# Patient Record
Sex: Male | Born: 1940 | Race: White | Hispanic: No | Marital: Married | Smoking: Former smoker
Health system: Southern US, Community
[De-identification: ages and names within clinical notes are randomized; demographics above are authoritative.]

## PROBLEM LIST (undated history)

## (undated) DIAGNOSIS — F329 Major depressive disorder, single episode, unspecified: Secondary | ICD-10-CM

## (undated) DIAGNOSIS — C9000 Multiple myeloma not having achieved remission: Secondary | ICD-10-CM

## (undated) DIAGNOSIS — R7303 Prediabetes: Secondary | ICD-10-CM

## (undated) DIAGNOSIS — F32A Depression, unspecified: Secondary | ICD-10-CM

## (undated) DIAGNOSIS — Z800 Family history of malignant neoplasm of digestive organs: Secondary | ICD-10-CM

## (undated) DIAGNOSIS — C61 Malignant neoplasm of prostate: Secondary | ICD-10-CM

## (undated) DIAGNOSIS — M1990 Unspecified osteoarthritis, unspecified site: Secondary | ICD-10-CM

## (undated) DIAGNOSIS — W19XXXA Unspecified fall, initial encounter: Secondary | ICD-10-CM

## (undated) DIAGNOSIS — G4700 Insomnia, unspecified: Secondary | ICD-10-CM

## (undated) DIAGNOSIS — F1021 Alcohol dependence, in remission: Secondary | ICD-10-CM

## (undated) DIAGNOSIS — I10 Essential (primary) hypertension: Secondary | ICD-10-CM

## (undated) DIAGNOSIS — M899 Disorder of bone, unspecified: Secondary | ICD-10-CM

## (undated) DIAGNOSIS — E785 Hyperlipidemia, unspecified: Secondary | ICD-10-CM

## (undated) HISTORY — PX: LAPAROSCOPIC RETROPUBIC PROSTATECTOMY: SUR794

## (undated) HISTORY — PX: EXCISIONAL TOTAL KNEE ARTHROPLASTY: SHX5015

## (undated) HISTORY — DX: Insomnia, unspecified: G47.00

## (undated) HISTORY — DX: Major depressive disorder, single episode, unspecified: F32.9

## (undated) HISTORY — DX: Essential (primary) hypertension: I10

## (undated) HISTORY — PX: OTHER SURGICAL HISTORY: SHX169

## (undated) HISTORY — DX: Prediabetes: R73.03

## (undated) HISTORY — DX: Depression, unspecified: F32.A

## (undated) HISTORY — DX: Family history of malignant neoplasm of digestive organs: Z80.0

## (undated) HISTORY — DX: Hyperlipidemia, unspecified: E78.5

## (undated) HISTORY — DX: Alcohol dependence, in remission: F10.21

## (undated) HISTORY — DX: Unspecified osteoarthritis, unspecified site: M19.90

---

## 2000-05-30 HISTORY — PX: ANTERIOR FUSION CERVICAL SPINE: SUR626

## 2013-05-30 HISTORY — PX: COLONOSCOPY: SHX174

## 2013-05-30 LAB — HM COLONOSCOPY

## 2014-07-04 DIAGNOSIS — C9002 Multiple myeloma in relapse: Secondary | ICD-10-CM | POA: Insufficient documentation

## 2014-11-24 DIAGNOSIS — M25562 Pain in left knee: Secondary | ICD-10-CM | POA: Insufficient documentation

## 2015-06-02 DIAGNOSIS — L600 Ingrowing nail: Secondary | ICD-10-CM | POA: Diagnosis not present

## 2015-06-02 DIAGNOSIS — M79674 Pain in right toe(s): Secondary | ICD-10-CM | POA: Diagnosis not present

## 2015-06-05 DIAGNOSIS — C9002 Multiple myeloma in relapse: Secondary | ICD-10-CM | POA: Diagnosis not present

## 2015-06-05 DIAGNOSIS — C9000 Multiple myeloma not having achieved remission: Secondary | ICD-10-CM | POA: Diagnosis not present

## 2015-06-12 DIAGNOSIS — C9002 Multiple myeloma in relapse: Secondary | ICD-10-CM | POA: Diagnosis not present

## 2015-06-12 DIAGNOSIS — L298 Other pruritus: Secondary | ICD-10-CM | POA: Diagnosis not present

## 2015-06-12 DIAGNOSIS — C9000 Multiple myeloma not having achieved remission: Secondary | ICD-10-CM | POA: Diagnosis not present

## 2015-06-12 DIAGNOSIS — G8929 Other chronic pain: Secondary | ICD-10-CM | POA: Diagnosis not present

## 2015-06-12 DIAGNOSIS — S76312D Strain of muscle, fascia and tendon of the posterior muscle group at thigh level, left thigh, subsequent encounter: Secondary | ICD-10-CM | POA: Diagnosis not present

## 2015-06-19 DIAGNOSIS — C9000 Multiple myeloma not having achieved remission: Secondary | ICD-10-CM | POA: Diagnosis not present

## 2015-06-26 DIAGNOSIS — C9000 Multiple myeloma not having achieved remission: Secondary | ICD-10-CM | POA: Diagnosis not present

## 2015-07-03 DIAGNOSIS — C9002 Multiple myeloma in relapse: Secondary | ICD-10-CM | POA: Diagnosis not present

## 2015-07-03 DIAGNOSIS — C9000 Multiple myeloma not having achieved remission: Secondary | ICD-10-CM | POA: Diagnosis not present

## 2015-07-13 DIAGNOSIS — T451X5A Adverse effect of antineoplastic and immunosuppressive drugs, initial encounter: Secondary | ICD-10-CM | POA: Insufficient documentation

## 2015-07-13 DIAGNOSIS — C9002 Multiple myeloma in relapse: Secondary | ICD-10-CM | POA: Diagnosis not present

## 2015-07-13 DIAGNOSIS — D6481 Anemia due to antineoplastic chemotherapy: Secondary | ICD-10-CM | POA: Diagnosis not present

## 2015-07-13 DIAGNOSIS — C9000 Multiple myeloma not having achieved remission: Secondary | ICD-10-CM | POA: Diagnosis not present

## 2015-07-20 DIAGNOSIS — C9000 Multiple myeloma not having achieved remission: Secondary | ICD-10-CM | POA: Diagnosis not present

## 2015-07-27 DIAGNOSIS — C9000 Multiple myeloma not having achieved remission: Secondary | ICD-10-CM | POA: Diagnosis not present

## 2015-08-03 DIAGNOSIS — C9000 Multiple myeloma not having achieved remission: Secondary | ICD-10-CM | POA: Diagnosis not present

## 2015-08-03 DIAGNOSIS — C9002 Multiple myeloma in relapse: Secondary | ICD-10-CM | POA: Diagnosis not present

## 2015-08-12 DIAGNOSIS — C9000 Multiple myeloma not having achieved remission: Secondary | ICD-10-CM | POA: Diagnosis not present

## 2015-08-12 DIAGNOSIS — M899 Disorder of bone, unspecified: Secondary | ICD-10-CM | POA: Insufficient documentation

## 2015-08-17 DIAGNOSIS — G8929 Other chronic pain: Secondary | ICD-10-CM | POA: Diagnosis not present

## 2015-08-17 DIAGNOSIS — L299 Pruritus, unspecified: Secondary | ICD-10-CM | POA: Diagnosis not present

## 2015-08-17 DIAGNOSIS — C9000 Multiple myeloma not having achieved remission: Secondary | ICD-10-CM | POA: Diagnosis not present

## 2015-08-17 DIAGNOSIS — C9002 Multiple myeloma in relapse: Secondary | ICD-10-CM | POA: Diagnosis not present

## 2015-08-17 DIAGNOSIS — Z79899 Other long term (current) drug therapy: Secondary | ICD-10-CM | POA: Diagnosis not present

## 2015-08-24 DIAGNOSIS — C9000 Multiple myeloma not having achieved remission: Secondary | ICD-10-CM | POA: Diagnosis not present

## 2015-08-26 DIAGNOSIS — H35372 Puckering of macula, left eye: Secondary | ICD-10-CM | POA: Diagnosis not present

## 2015-08-26 DIAGNOSIS — H01003 Unspecified blepharitis right eye, unspecified eyelid: Secondary | ICD-10-CM | POA: Diagnosis not present

## 2015-08-26 DIAGNOSIS — H01006 Unspecified blepharitis left eye, unspecified eyelid: Secondary | ICD-10-CM | POA: Diagnosis not present

## 2015-08-31 DIAGNOSIS — C9000 Multiple myeloma not having achieved remission: Secondary | ICD-10-CM | POA: Diagnosis not present

## 2015-09-10 DIAGNOSIS — C9000 Multiple myeloma not having achieved remission: Secondary | ICD-10-CM | POA: Diagnosis not present

## 2015-09-18 DIAGNOSIS — Z5112 Encounter for antineoplastic immunotherapy: Secondary | ICD-10-CM | POA: Diagnosis not present

## 2015-09-18 DIAGNOSIS — M899 Disorder of bone, unspecified: Secondary | ICD-10-CM | POA: Diagnosis not present

## 2015-09-18 DIAGNOSIS — C9000 Multiple myeloma not having achieved remission: Secondary | ICD-10-CM | POA: Diagnosis not present

## 2015-09-18 DIAGNOSIS — Z5111 Encounter for antineoplastic chemotherapy: Secondary | ICD-10-CM | POA: Diagnosis not present

## 2015-09-24 DIAGNOSIS — Z87891 Personal history of nicotine dependence: Secondary | ICD-10-CM | POA: Diagnosis not present

## 2015-09-24 DIAGNOSIS — C9000 Multiple myeloma not having achieved remission: Secondary | ICD-10-CM | POA: Diagnosis not present

## 2015-09-24 DIAGNOSIS — Z5111 Encounter for antineoplastic chemotherapy: Secondary | ICD-10-CM | POA: Diagnosis not present

## 2015-09-28 DIAGNOSIS — M4806 Spinal stenosis, lumbar region: Secondary | ICD-10-CM | POA: Diagnosis not present

## 2015-09-28 DIAGNOSIS — M5136 Other intervertebral disc degeneration, lumbar region: Secondary | ICD-10-CM | POA: Diagnosis not present

## 2015-09-28 DIAGNOSIS — C9000 Multiple myeloma not having achieved remission: Secondary | ICD-10-CM | POA: Diagnosis not present

## 2015-09-30 DIAGNOSIS — Z87891 Personal history of nicotine dependence: Secondary | ICD-10-CM | POA: Diagnosis not present

## 2015-09-30 DIAGNOSIS — Z85828 Personal history of other malignant neoplasm of skin: Secondary | ICD-10-CM | POA: Diagnosis not present

## 2015-09-30 DIAGNOSIS — L219 Seborrheic dermatitis, unspecified: Secondary | ICD-10-CM | POA: Diagnosis not present

## 2015-09-30 DIAGNOSIS — Z5111 Encounter for antineoplastic chemotherapy: Secondary | ICD-10-CM | POA: Diagnosis not present

## 2015-09-30 DIAGNOSIS — C9000 Multiple myeloma not having achieved remission: Secondary | ICD-10-CM | POA: Diagnosis not present

## 2015-09-30 DIAGNOSIS — L570 Actinic keratosis: Secondary | ICD-10-CM | POA: Diagnosis not present

## 2015-10-14 DIAGNOSIS — C9000 Multiple myeloma not having achieved remission: Secondary | ICD-10-CM | POA: Diagnosis not present

## 2015-10-14 DIAGNOSIS — Z5112 Encounter for antineoplastic immunotherapy: Secondary | ICD-10-CM | POA: Diagnosis not present

## 2015-10-14 DIAGNOSIS — Z79899 Other long term (current) drug therapy: Secondary | ICD-10-CM | POA: Diagnosis not present

## 2015-10-19 DIAGNOSIS — C9000 Multiple myeloma not having achieved remission: Secondary | ICD-10-CM | POA: Diagnosis not present

## 2015-10-20 DIAGNOSIS — Z961 Presence of intraocular lens: Secondary | ICD-10-CM | POA: Diagnosis not present

## 2015-10-20 DIAGNOSIS — H01029 Squamous blepharitis unspecified eye, unspecified eyelid: Secondary | ICD-10-CM | POA: Diagnosis not present

## 2015-10-21 DIAGNOSIS — C9000 Multiple myeloma not having achieved remission: Secondary | ICD-10-CM | POA: Diagnosis not present

## 2015-10-28 DIAGNOSIS — C9000 Multiple myeloma not having achieved remission: Secondary | ICD-10-CM | POA: Diagnosis not present

## 2015-11-05 DIAGNOSIS — C9000 Multiple myeloma not having achieved remission: Secondary | ICD-10-CM | POA: Diagnosis not present

## 2015-11-05 DIAGNOSIS — Z8546 Personal history of malignant neoplasm of prostate: Secondary | ICD-10-CM | POA: Diagnosis not present

## 2015-11-11 DIAGNOSIS — C9000 Multiple myeloma not having achieved remission: Secondary | ICD-10-CM | POA: Diagnosis not present

## 2015-11-11 DIAGNOSIS — Z8546 Personal history of malignant neoplasm of prostate: Secondary | ICD-10-CM | POA: Diagnosis not present

## 2015-11-25 DIAGNOSIS — C9000 Multiple myeloma not having achieved remission: Secondary | ICD-10-CM | POA: Diagnosis not present

## 2015-12-03 DIAGNOSIS — H16223 Keratoconjunctivitis sicca, not specified as Sjogren's, bilateral: Secondary | ICD-10-CM | POA: Diagnosis not present

## 2015-12-03 DIAGNOSIS — Z961 Presence of intraocular lens: Secondary | ICD-10-CM | POA: Diagnosis not present

## 2015-12-03 DIAGNOSIS — H01029 Squamous blepharitis unspecified eye, unspecified eyelid: Secondary | ICD-10-CM | POA: Diagnosis not present

## 2015-12-03 DIAGNOSIS — H35372 Puckering of macula, left eye: Secondary | ICD-10-CM | POA: Diagnosis not present

## 2015-12-09 DIAGNOSIS — C9000 Multiple myeloma not having achieved remission: Secondary | ICD-10-CM | POA: Diagnosis not present

## 2015-12-16 DIAGNOSIS — J069 Acute upper respiratory infection, unspecified: Secondary | ICD-10-CM | POA: Diagnosis not present

## 2015-12-23 DIAGNOSIS — C9000 Multiple myeloma not having achieved remission: Secondary | ICD-10-CM | POA: Diagnosis not present

## 2016-01-06 DIAGNOSIS — C9000 Multiple myeloma not having achieved remission: Secondary | ICD-10-CM | POA: Diagnosis not present

## 2016-01-14 DIAGNOSIS — H01029 Squamous blepharitis unspecified eye, unspecified eyelid: Secondary | ICD-10-CM | POA: Diagnosis not present

## 2016-01-14 DIAGNOSIS — Z961 Presence of intraocular lens: Secondary | ICD-10-CM | POA: Diagnosis not present

## 2016-01-14 DIAGNOSIS — H35372 Puckering of macula, left eye: Secondary | ICD-10-CM | POA: Diagnosis not present

## 2016-01-14 DIAGNOSIS — H16223 Keratoconjunctivitis sicca, not specified as Sjogren's, bilateral: Secondary | ICD-10-CM | POA: Diagnosis not present

## 2016-01-20 DIAGNOSIS — R972 Elevated prostate specific antigen [PSA]: Secondary | ICD-10-CM | POA: Diagnosis not present

## 2016-01-20 DIAGNOSIS — Z8546 Personal history of malignant neoplasm of prostate: Secondary | ICD-10-CM | POA: Insufficient documentation

## 2016-01-20 DIAGNOSIS — Z87891 Personal history of nicotine dependence: Secondary | ICD-10-CM | POA: Diagnosis not present

## 2016-01-20 DIAGNOSIS — C9000 Multiple myeloma not having achieved remission: Secondary | ICD-10-CM | POA: Diagnosis not present

## 2016-01-20 DIAGNOSIS — Z9079 Acquired absence of other genital organ(s): Secondary | ICD-10-CM | POA: Diagnosis not present

## 2016-01-20 DIAGNOSIS — C61 Malignant neoplasm of prostate: Secondary | ICD-10-CM | POA: Diagnosis not present

## 2016-01-20 DIAGNOSIS — Z5112 Encounter for antineoplastic immunotherapy: Secondary | ICD-10-CM | POA: Diagnosis not present

## 2016-01-20 DIAGNOSIS — Z79899 Other long term (current) drug therapy: Secondary | ICD-10-CM | POA: Diagnosis not present

## 2016-02-03 DIAGNOSIS — M50222 Other cervical disc displacement at C5-C6 level: Secondary | ICD-10-CM | POA: Diagnosis not present

## 2016-02-03 DIAGNOSIS — M50223 Other cervical disc displacement at C6-C7 level: Secondary | ICD-10-CM | POA: Diagnosis not present

## 2016-02-03 DIAGNOSIS — M50221 Other cervical disc displacement at C4-C5 level: Secondary | ICD-10-CM | POA: Diagnosis not present

## 2016-02-03 DIAGNOSIS — M4806 Spinal stenosis, lumbar region: Secondary | ICD-10-CM | POA: Diagnosis not present

## 2016-02-03 DIAGNOSIS — Z981 Arthrodesis status: Secondary | ICD-10-CM | POA: Diagnosis not present

## 2016-02-03 DIAGNOSIS — C9000 Multiple myeloma not having achieved remission: Secondary | ICD-10-CM | POA: Diagnosis not present

## 2016-02-11 DIAGNOSIS — Z9079 Acquired absence of other genital organ(s): Secondary | ICD-10-CM | POA: Diagnosis not present

## 2016-02-11 DIAGNOSIS — C61 Malignant neoplasm of prostate: Secondary | ICD-10-CM | POA: Diagnosis not present

## 2016-02-11 DIAGNOSIS — R972 Elevated prostate specific antigen [PSA]: Secondary | ICD-10-CM | POA: Diagnosis not present

## 2016-02-11 DIAGNOSIS — Z8546 Personal history of malignant neoplasm of prostate: Secondary | ICD-10-CM | POA: Diagnosis not present

## 2016-02-11 DIAGNOSIS — C9001 Multiple myeloma in remission: Secondary | ICD-10-CM | POA: Diagnosis not present

## 2016-02-11 DIAGNOSIS — C9000 Multiple myeloma not having achieved remission: Secondary | ICD-10-CM | POA: Diagnosis not present

## 2016-02-18 DIAGNOSIS — C9000 Multiple myeloma not having achieved remission: Secondary | ICD-10-CM | POA: Diagnosis not present

## 2016-02-19 DIAGNOSIS — R972 Elevated prostate specific antigen [PSA]: Secondary | ICD-10-CM | POA: Diagnosis not present

## 2016-02-26 DIAGNOSIS — C9000 Multiple myeloma not having achieved remission: Secondary | ICD-10-CM | POA: Diagnosis not present

## 2016-02-26 DIAGNOSIS — Z9079 Acquired absence of other genital organ(s): Secondary | ICD-10-CM | POA: Diagnosis not present

## 2016-02-26 DIAGNOSIS — Z8546 Personal history of malignant neoplasm of prostate: Secondary | ICD-10-CM | POA: Diagnosis not present

## 2016-02-26 DIAGNOSIS — C61 Malignant neoplasm of prostate: Secondary | ICD-10-CM | POA: Diagnosis not present

## 2016-03-03 DIAGNOSIS — C9000 Multiple myeloma not having achieved remission: Secondary | ICD-10-CM | POA: Diagnosis not present

## 2016-03-09 DIAGNOSIS — C61 Malignant neoplasm of prostate: Secondary | ICD-10-CM | POA: Diagnosis not present

## 2016-03-17 DIAGNOSIS — C9000 Multiple myeloma not having achieved remission: Secondary | ICD-10-CM | POA: Diagnosis not present

## 2016-03-28 ENCOUNTER — Encounter: Payer: Self-pay | Admitting: Radiation Oncology

## 2016-03-28 ENCOUNTER — Ambulatory Visit
Admission: RE | Admit: 2016-03-28 | Discharge: 2016-03-28 | Disposition: A | Discharge status: Home / Self Care | Payer: Medicare Other | Source: Ambulatory Visit | Attending: Radiation Oncology | Admitting: Radiation Oncology

## 2016-03-28 VITALS — BP 184/77 | HR 65 | Resp 18 | Wt 253.0 lb

## 2016-03-28 DIAGNOSIS — Z510 Encounter for antineoplastic radiation therapy: Secondary | ICD-10-CM | POA: Diagnosis not present

## 2016-03-28 DIAGNOSIS — Z981 Arthrodesis status: Secondary | ICD-10-CM | POA: Insufficient documentation

## 2016-03-28 DIAGNOSIS — M1990 Unspecified osteoarthritis, unspecified site: Secondary | ICD-10-CM | POA: Diagnosis not present

## 2016-03-28 DIAGNOSIS — R972 Elevated prostate specific antigen [PSA]: Secondary | ICD-10-CM | POA: Diagnosis not present

## 2016-03-28 DIAGNOSIS — C9000 Multiple myeloma not having achieved remission: Secondary | ICD-10-CM | POA: Insufficient documentation

## 2016-03-28 DIAGNOSIS — Z923 Personal history of irradiation: Secondary | ICD-10-CM | POA: Diagnosis not present

## 2016-03-28 DIAGNOSIS — Z800 Family history of malignant neoplasm of digestive organs: Secondary | ICD-10-CM | POA: Insufficient documentation

## 2016-03-28 DIAGNOSIS — Z9079 Acquired absence of other genital organ(s): Secondary | ICD-10-CM | POA: Insufficient documentation

## 2016-03-28 DIAGNOSIS — C61 Malignant neoplasm of prostate: Secondary | ICD-10-CM

## 2016-03-28 DIAGNOSIS — Z87891 Personal history of nicotine dependence: Secondary | ICD-10-CM | POA: Insufficient documentation

## 2016-03-28 DIAGNOSIS — R300 Dysuria: Secondary | ICD-10-CM | POA: Diagnosis not present

## 2016-03-28 DIAGNOSIS — Z79899 Other long term (current) drug therapy: Secondary | ICD-10-CM | POA: Diagnosis not present

## 2016-03-28 DIAGNOSIS — Z9484 Stem cells transplant status: Secondary | ICD-10-CM | POA: Diagnosis not present

## 2016-03-28 HISTORY — DX: Malignant neoplasm of prostate: C61

## 2016-03-28 HISTORY — DX: Disorder of bone, unspecified: M89.9

## 2016-03-28 HISTORY — DX: Multiple myeloma not having achieved remission: C90.00

## 2016-03-28 NOTE — Progress Notes (Signed)
See progress note under physician encounter. 

## 2016-03-28 NOTE — Progress Notes (Signed)
[place]         [phone] ________________________________  Initial Outpatient Consultation  Name: Derek Castro MRN: [id number]  Date: 03/28/2016  DOB: May 16, 1941  REFERRING PHYSICIAN: Ramon Dredge, MD  DIAGNOSIS: There were no encounter diagnoses.  No diagnosis found.  Recurrent biochemical prostatic adenocarcinoma. At time of diagnosis in 2013, Gleason Score was 4+3 and PSA 3.6  HISTORY OF PRESENT ILLNESS:Derek Castro is a 75 y.o. male with a history of prostate cancer. In 2013, the patient underwent prostatectomy in [place] for a Gleason 4+3 adenocarcinoma. Prior to surgery his PSA was 3.6. His final pathology did not reveal evidence of extracapsular extension, spread to lymph nodes or seminal vesicle however the margin was focally involved. His PSA was undetectable for a while however his PSA began to rise in the last year, this summer it was 0.15 in June 2017. On 02/11/16, the PSA was 0.23. On 02/19/16, the PSA was 0.25. As he is treated for his multiple myeloma at [facility] since he relocated to [facility], he did meet with radiation oncology and spent time speaking with Dr. Truman Hayward about the options for treating the prostatic fossa. He has been counseled on the role for IMRT to the fossa with short-term androgen deprivation to total of 6 months.  He received Lupron 45 mg on 02/26/2016. He comes to discuss Administration of radiotherapy with Dr. Tammi Klippel.   PREVIOUS RADIATION THERAPY: Yes  Patient reports he received palliative treatment about 5-6 years ago to the lumbar spine  in the back, and at least 10-12 years ago he received treatment to the cervical spine.   REVIEW OF SYSTEMS: On review of systems, the patient reports that he is doing well overall. He denies any chest pain, shortness of breath, cough, fevers, chills, night sweats, unintended weight changes. He denies any bowel or bladder disturbances, and denies abdominal pain,  nausea or vomiting. He denies any new musculoskeletal or joint aches or pains. A complete review of systems is obtained and is otherwise negative.   Past Medical History:  Past Medical History:  Diagnosis Date  . Arthritis   . Bone lesion   . Multiple myeloma ([place])   . Prostate cancer [facility])     Past Surgical History: Past Surgical History:  Procedure Laterality Date  . ANTERIOR FUSION CERVICAL SPINE  2002  . autologous stem cell transplant    . EXCISIONAL TOTAL KNEE ARTHROPLASTY    . LAPAROSCOPIC RETROPUBIC PROSTATECTOMY      Social History:  Social History   Social History  . Marital status: Married    Spouse name: N/A  . Number of children: N/A  . Years of education: N/A   Occupational History  . Not on file.   Social History Main Topics  . Smoking status: Former Smoker    Packs/day: 1.00    Years: 5.00    Types: Cigarettes    Quit date: 02/11/1996  . Smokeless tobacco: Never Used  . Alcohol use No  . Drug use: No  . Sexual activity: Not Currently   Other Topics Concern  . Not on file   Social History Narrative  . No narrative on file    Family History: Family History  Problem Relation Age of Onset  . Cancer Mother     colon   Medications:  Current Meds  Medication Sig  . acetaminophen (TYLENOL) 325 MG tablet Take by mouth.  Marland Kitchen acyclovir (ZOVIRAX) 400 MG tablet Take by mouth.  Marland Kitchen  amLODipine (NORVASC) 2.5 MG tablet Take by mouth.  . bortezomib IV (VELCADE) 3.5 MG injection Inject into the skin.  . fluorometholone (FML) 0.1 % ophthalmic suspension INSTILL 1 DROP INTO BOTH EYES TWICE A DAY  . FLUoxetine (PROZAC) 20 MG capsule Take 20 mg by mouth daily.  . QUEtiapine (SEROQUEL) 100 MG tablet Take by mouth.  . zolendronic acid (ZOMETA) 4 MG/5ML injection Inject into the vein.    Allergies: No Known Allergies   PHYSICAL EXAM:  Blood pressure (!) 184/77, pulse 65, resp. rate 18, weight 253 lb (114.8 kg), SpO2 100 %.  In general this is a well  appearing caucasian male in no acute distress. He's alert and oriented x4 and appropriate throughout the examination. Cardiopulmonary assessment is negative for acute distress and he exhibits normal effort.   KPS = 100  100 - Normal; no complaints; no evidence of disease. 90   - Able to carry on normal activity; minor signs or symptoms of disease. 80   - Normal activity with effort; some signs or symptoms of disease. 41   - Cares for self; unable to carry on normal activity or to do active work. 60   - Requires occasional assistance, but is able to care for most of his personal needs. 50   - Requires considerable assistance and frequent medical care. 31   - Disabled; requires special care and assistance. 39   - Severely disabled; hospital admission is indicated although death not imminent. 37   - Very sick; hospital admission necessary; active supportive treatment necessary. 10   - Moribund; fatal processes progressing rapidly. 0     - Dead  Karnofsky DA, Abelmann WH, Craver LS and Burchenal JH [phone]) The use of the nitrogen mustards in the palliative treatment of carcinoma: with particular reference to bronchogenic carcinoma Cancer 1 634-56  LABORATORY DATA:  No results found for: WBC, HGB, HCT, MCV, PLT No results found for: NA, K, CL, CO2 No results found for: ALT, AST, GGT, ALKPHOS, BILITOT   RADIOGRAPHY: No results found.    IMPRESSION/PLAN: 1. 75 y.o. gentleman with recurrent biochemical prostatic adenocarcinoma. At time of diagnosis in 2013, Gleason Score was 4+3 and PSA 3.6, now with a rising PSA of .25. Dr. Tammi Klippel discusses the findings on the patient's pathology report dating back to his prostatectomy 2013 and reviews the rationale for or radiotherapy to the prosthetic fossa with his elevation in PSA. He reviews the concurrent use of androgen deprivation and agrees with this. At the patient has received his Lupron shot already, we could postpone treatment until the first Year or  move forward now. The patient is interested in moving ahead now. We discussed the risks, benefits, short and  long-term effects of radiotherapy, and the patient is interested in moving forward. Dr. Tammi Klippel recommends a course of 7-1/2 weeks of radiotherapy to total 38 fractions. We'll set him up for simulation tomorrow morning, and will move forward with his treatment in the next week. 2. Multiple myeloma. The patient will continue to follow up with his medical oncologist [facility], he will discontinue his Velcade during the course of radiation and resume this again following.  The above documentation reflects my direct findings during this shared patient visit. Please see the separate note by Dr. Tammi Klippel on this date for the remainder of the patient's plan of care.     Carola Rhine, PAC      This document serves as a record of services personally performed by Tyler Pita,  MD. It was created on his behalf by Bethann Humble, a trained medical scribe. The creation of this record is based on the scribe's personal observations and the provider's statements to them. This document has been checked and approved by the attending provider.

## 2016-03-28 NOTE — Progress Notes (Signed)
GU Location of Tumor / Histology: recurrent biochemical prostatic adenocarcinoma  If Prostate Cancer, Gleason Score is (4 + 3) and PSA is (3.6) at diagnosis in 2013  Adria Dill first postoperative PSA was undetectable. Most recent PSA history summarized below. 11/11/15 PSA  0.15 02/11/16 PSA  0.23 02/19/16 PSA  0.25     Past/Anticipated interventions by urology, if any: radical prostatectomy. Lupron 45 mg on 02/26/16.  Past/Anticipated interventions by medical oncology, if any: no  Weight changes, if any: no  Bowel/Bladder complaints, if any: Moderate ED. No bowel complaints. IPSS 18: incomplete emptying, frequency, intermittency, urgency, weak stream, straining and nocturia x 3. Denies dysuria or hematuria. Reports occasional leakage.   Nausea/Vomiting, if any: no  Pain issues, if any:  no  SAFETY ISSUES:  Prior radiation? Yes @ [facility] in [place]. Received radiation to his neck and back.  Pacemaker/ICD? no  Possible current pregnancy? no  Is the patient on methotrexate? no  Current Complaints / other details:  74 year old male. Married with two children.Retired.

## 2016-03-29 ENCOUNTER — Ambulatory Visit
Admission: RE | Admit: 2016-03-29 | Discharge: 2016-03-29 | Disposition: A | Discharge status: Home / Self Care | Payer: Medicare Other | Source: Ambulatory Visit | Attending: Radiation Oncology | Admitting: Radiation Oncology

## 2016-03-29 DIAGNOSIS — Z923 Personal history of irradiation: Secondary | ICD-10-CM | POA: Diagnosis not present

## 2016-03-29 DIAGNOSIS — M1990 Unspecified osteoarthritis, unspecified site: Secondary | ICD-10-CM | POA: Diagnosis not present

## 2016-03-29 DIAGNOSIS — C61 Malignant neoplasm of prostate: Secondary | ICD-10-CM

## 2016-03-29 DIAGNOSIS — C9000 Multiple myeloma not having achieved remission: Secondary | ICD-10-CM | POA: Diagnosis not present

## 2016-03-29 DIAGNOSIS — Z510 Encounter for antineoplastic radiation therapy: Secondary | ICD-10-CM | POA: Diagnosis not present

## 2016-03-29 DIAGNOSIS — R300 Dysuria: Secondary | ICD-10-CM | POA: Diagnosis not present

## 2016-03-29 NOTE — Addendum Note (Signed)
Encounter addended by: Heywood Footman, RN on: 03/29/2016 10:51 AM<BR>    Actions taken: Charge Capture section accepted

## 2016-03-29 NOTE — Progress Notes (Signed)
  Radiation Oncology         [phone]) [phone] ________________________________  Name: Derek Castro MRN: [id number]  Date: 03/29/2016  DOB: Sep 09, 1940  SIMULATION AND TREATMENT PLANNING NOTE    ICD-9-CM ICD-10-CM   1. Malignant neoplasm of prostate ([place]) 185 C61     DIAGNOSIS:  75 y.o. gentleman s/p prostatectomy for adenocarcinoma with a Gleason Score was 4+3 and a detectable rising PSA of 0.25  recurrent biochemical prostatic  NARRATIVE:  The patient was brought to the [facility].  Identity was confirmed.  All relevant records and images related to the planned course of therapy were reviewed.  The patient freely provided informed written consent to proceed with treatment after reviewing the details related to the planned course of therapy. The consent form was witnessed and verified by the simulation staff.  Then, the patient was set-up in a stable reproducible supine position for radiation therapy.  A vacuum lock pillow device was custom fabricated to position his legs in a reproducible immobilized position.  Then, I performed a urethrogram under sterile conditions to identify the prostatic apex.  CT images were obtained.  Surface markings were placed.  The CT images were loaded into the planning software.  Then the prostate target and avoidance structures including the rectum, bladder, bowel and hips were contoured.  Treatment planning then occurred.  The radiation prescription was entered and confirmed.  A total of 1 complex treatment devices were fabricated. I have requested : Intensity Modulated Radiotherapy (IMRT) is medically necessary for this case for the following reason:  Rectal sparing.Marland Kitchen  PLAN:  The patient will receive 68.4 Gy in 38 fractions.  ________________________________  Sheral Apley Tammi Klippel, M.D.

## 2016-03-31 DIAGNOSIS — M899 Disorder of bone, unspecified: Secondary | ICD-10-CM | POA: Diagnosis not present

## 2016-03-31 DIAGNOSIS — C9000 Multiple myeloma not having achieved remission: Secondary | ICD-10-CM | POA: Diagnosis not present

## 2016-03-31 DIAGNOSIS — L989 Disorder of the skin and subcutaneous tissue, unspecified: Secondary | ICD-10-CM | POA: Diagnosis not present

## 2016-04-04 DIAGNOSIS — M1990 Unspecified osteoarthritis, unspecified site: Secondary | ICD-10-CM | POA: Diagnosis not present

## 2016-04-04 DIAGNOSIS — R300 Dysuria: Secondary | ICD-10-CM | POA: Diagnosis not present

## 2016-04-04 DIAGNOSIS — C61 Malignant neoplasm of prostate: Secondary | ICD-10-CM | POA: Diagnosis not present

## 2016-04-04 DIAGNOSIS — C9000 Multiple myeloma not having achieved remission: Secondary | ICD-10-CM | POA: Diagnosis not present

## 2016-04-04 DIAGNOSIS — Z510 Encounter for antineoplastic radiation therapy: Secondary | ICD-10-CM | POA: Diagnosis not present

## 2016-04-04 DIAGNOSIS — Z923 Personal history of irradiation: Secondary | ICD-10-CM | POA: Diagnosis not present

## 2016-04-05 DIAGNOSIS — C61 Malignant neoplasm of prostate: Secondary | ICD-10-CM | POA: Diagnosis not present

## 2016-04-07 ENCOUNTER — Encounter: Payer: Self-pay | Admitting: Medical Oncology

## 2016-04-07 ENCOUNTER — Ambulatory Visit
Admission: RE | Admit: 2016-04-07 | Discharge: 2016-04-07 | Disposition: A | Discharge status: Home / Self Care | Payer: Medicare Other | Source: Ambulatory Visit | Attending: Radiation Oncology | Admitting: Radiation Oncology

## 2016-04-07 DIAGNOSIS — C61 Malignant neoplasm of prostate: Secondary | ICD-10-CM | POA: Diagnosis not present

## 2016-04-07 DIAGNOSIS — Z923 Personal history of irradiation: Secondary | ICD-10-CM | POA: Diagnosis not present

## 2016-04-07 DIAGNOSIS — R300 Dysuria: Secondary | ICD-10-CM | POA: Diagnosis not present

## 2016-04-07 DIAGNOSIS — C9000 Multiple myeloma not having achieved remission: Secondary | ICD-10-CM | POA: Diagnosis not present

## 2016-04-07 DIAGNOSIS — Z510 Encounter for antineoplastic radiation therapy: Secondary | ICD-10-CM | POA: Diagnosis not present

## 2016-04-07 DIAGNOSIS — M1990 Unspecified osteoarthritis, unspecified site: Secondary | ICD-10-CM | POA: Diagnosis not present

## 2016-04-08 ENCOUNTER — Ambulatory Visit
Admission: RE | Admit: 2016-04-08 | Discharge: 2016-04-08 | Disposition: A | Discharge status: Home / Self Care | Payer: Medicare Other | Source: Ambulatory Visit | Attending: Radiation Oncology | Admitting: Radiation Oncology

## 2016-04-08 VITALS — BP 139/58 | HR 72 | Resp 16 | Wt 249.6 lb

## 2016-04-08 DIAGNOSIS — R300 Dysuria: Secondary | ICD-10-CM | POA: Diagnosis not present

## 2016-04-08 DIAGNOSIS — C9000 Multiple myeloma not having achieved remission: Secondary | ICD-10-CM | POA: Diagnosis not present

## 2016-04-08 DIAGNOSIS — C61 Malignant neoplasm of prostate: Secondary | ICD-10-CM

## 2016-04-08 DIAGNOSIS — Z923 Personal history of irradiation: Secondary | ICD-10-CM | POA: Diagnosis not present

## 2016-04-08 DIAGNOSIS — M1990 Unspecified osteoarthritis, unspecified site: Secondary | ICD-10-CM | POA: Diagnosis not present

## 2016-04-08 DIAGNOSIS — Z510 Encounter for antineoplastic radiation therapy: Secondary | ICD-10-CM | POA: Diagnosis not present

## 2016-04-08 NOTE — Addendum Note (Signed)
Encounter addended by: Heywood Footman, RN on: 04/08/2016  3:05 PM<BR>    Actions taken: Chief Complaint modified, Patient Education assessment filed

## 2016-04-08 NOTE — Progress Notes (Signed)
  Radiation Oncology         [phone]   Name: Derek Castro MRN: [id number]   Date: 04/08/2016  DOB: 11/05/1940   Weekly Radiation Therapy Management    ICD-9-CM ICD-10-CM   1. Malignant neoplasm of prostate ([facility]) 185 C61     Current Dose: 3.6 Gy  Planned Dose:  68.4 Gy  Narrative The patient presents for routine under treatment assessment.  Weight and vitals stable. Denies pain. No bowel complaints. Reports continued incomplete emptying, frequency, intermittency, urgency, weak stream, straining, and nocturia x 3. Denies dysuria, hematuria, or fatigue. Reports occasional leakage.  Set-up films were reviewed. The chart was checked.  Physical Findings  weight is 249 lb 9.6 oz (113.2 kg). His blood pressure is 139/58 (abnormal) and his pulse is 72. His respiration is 16 and oxygen saturation is 100%.  Weight essentially stable.  No significant changes.  Impression The patient is tolerating radiation.  Plan Continue treatment as planned.         Sheral Apley Tammi Klippel, M.D.  This document serves as a record of services personally performed by Tyler Pita, MD. It was created on his behalf by Darcus Austin, a trained medical scribe. The creation of this record is based on the scribe's personal observations and the provider's statements to them. This document has been checked and approved by the attending provider.

## 2016-04-08 NOTE — Progress Notes (Signed)
Weight and vitals stable. Denies pain. No bowel complaints. Reports continued incomplete emptying, frequency, intermittency, urgency, weak stream, straining and nocturia x 3. Denies dysuria or hematuria. Reports occasional leakage. Denies fatigue.  BP (!) 139/58 (BP Location: Left Arm, Patient Position: Sitting, Cuff Size: Large)   Pulse 72   Resp 16   Wt 249 lb 9.6 oz (113.2 kg)   SpO2 100%  Wt Readings from Last 3 Encounters:  04/08/16 249 lb 9.6 oz (113.2 kg)  03/28/16 253 lb (114.8 kg)

## 2016-04-11 ENCOUNTER — Ambulatory Visit
Admission: RE | Admit: 2016-04-11 | Discharge: 2016-04-11 | Disposition: A | Discharge status: Home / Self Care | Payer: Medicare Other | Source: Ambulatory Visit | Attending: Radiation Oncology | Admitting: Radiation Oncology

## 2016-04-11 DIAGNOSIS — C61 Malignant neoplasm of prostate: Secondary | ICD-10-CM | POA: Diagnosis not present

## 2016-04-11 DIAGNOSIS — M1990 Unspecified osteoarthritis, unspecified site: Secondary | ICD-10-CM | POA: Diagnosis not present

## 2016-04-11 DIAGNOSIS — R300 Dysuria: Secondary | ICD-10-CM | POA: Diagnosis not present

## 2016-04-11 DIAGNOSIS — Z923 Personal history of irradiation: Secondary | ICD-10-CM | POA: Diagnosis not present

## 2016-04-11 DIAGNOSIS — Z510 Encounter for antineoplastic radiation therapy: Secondary | ICD-10-CM | POA: Diagnosis not present

## 2016-04-11 DIAGNOSIS — C9000 Multiple myeloma not having achieved remission: Secondary | ICD-10-CM | POA: Diagnosis not present

## 2016-04-12 ENCOUNTER — Ambulatory Visit
Admission: RE | Admit: 2016-04-12 | Discharge: 2016-04-12 | Disposition: A | Discharge status: Home / Self Care | Payer: Medicare Other | Source: Ambulatory Visit | Attending: Radiation Oncology | Admitting: Radiation Oncology

## 2016-04-12 DIAGNOSIS — C9000 Multiple myeloma not having achieved remission: Secondary | ICD-10-CM | POA: Diagnosis not present

## 2016-04-12 DIAGNOSIS — Z510 Encounter for antineoplastic radiation therapy: Secondary | ICD-10-CM | POA: Diagnosis not present

## 2016-04-12 DIAGNOSIS — C61 Malignant neoplasm of prostate: Secondary | ICD-10-CM | POA: Diagnosis not present

## 2016-04-12 DIAGNOSIS — Z923 Personal history of irradiation: Secondary | ICD-10-CM | POA: Diagnosis not present

## 2016-04-12 DIAGNOSIS — M1990 Unspecified osteoarthritis, unspecified site: Secondary | ICD-10-CM | POA: Diagnosis not present

## 2016-04-12 DIAGNOSIS — R300 Dysuria: Secondary | ICD-10-CM | POA: Diagnosis not present

## 2016-04-13 ENCOUNTER — Ambulatory Visit
Admission: RE | Admit: 2016-04-13 | Discharge: 2016-04-13 | Disposition: A | Discharge status: Home / Self Care | Payer: Medicare Other | Source: Ambulatory Visit | Attending: Radiation Oncology | Admitting: Radiation Oncology

## 2016-04-13 ENCOUNTER — Encounter: Payer: Self-pay | Admitting: Medical Oncology

## 2016-04-13 DIAGNOSIS — R300 Dysuria: Secondary | ICD-10-CM | POA: Diagnosis not present

## 2016-04-13 DIAGNOSIS — C61 Malignant neoplasm of prostate: Secondary | ICD-10-CM

## 2016-04-13 DIAGNOSIS — Z923 Personal history of irradiation: Secondary | ICD-10-CM | POA: Diagnosis not present

## 2016-04-13 DIAGNOSIS — Z510 Encounter for antineoplastic radiation therapy: Secondary | ICD-10-CM | POA: Diagnosis not present

## 2016-04-13 DIAGNOSIS — C9000 Multiple myeloma not having achieved remission: Secondary | ICD-10-CM | POA: Diagnosis not present

## 2016-04-13 DIAGNOSIS — M1990 Unspecified osteoarthritis, unspecified site: Secondary | ICD-10-CM | POA: Diagnosis not present

## 2016-04-13 MED ORDER — LEVOFLOXACIN 500 MG PO TABS: 500.0000 mg | ORAL_TABLET | Freq: Every day | ORAL | 0 refills | Status: DC

## 2016-04-13 NOTE — Progress Notes (Signed)
Patient presented to the clinic today following radiation treatment requesting to be evaluated by a provider. Patient reports that in addition to prostate cancer he has multiple myeloma and now suffers from chest congestion. Patient requesting a z pak. Patient explains his PCP in [place] would prescribe this when symptoms of this nature presented and they would quickly then, resolve. Patient reports he has yet to establish a PCP in [place]. Patient reports new onset of productive cough with yellow to green thick sputum. BP elevated. Patient afebrile.   BP (!) 164/84 (BP Location: Left Arm, Patient Position: Sitting, Cuff Size: Large)   Pulse 74   Temp 98.4 F (36.9 C) (Oral)   Resp 18   SpO2 99%  Wt Readings from Last 3 Encounters:  04/08/16 249 lb 9.6 oz (113.2 kg)  03/28/16 253 lb (114.8 kg)

## 2016-04-14 ENCOUNTER — Ambulatory Visit
Admission: RE | Admit: 2016-04-14 | Discharge: 2016-04-14 | Disposition: A | Discharge status: Home / Self Care | Payer: Medicare Other | Source: Ambulatory Visit | Attending: Radiation Oncology | Admitting: Radiation Oncology

## 2016-04-14 VITALS — BP 140/67 | HR 75 | Resp 18 | Wt 247.4 lb

## 2016-04-14 DIAGNOSIS — Z923 Personal history of irradiation: Secondary | ICD-10-CM | POA: Diagnosis not present

## 2016-04-14 DIAGNOSIS — M1990 Unspecified osteoarthritis, unspecified site: Secondary | ICD-10-CM | POA: Diagnosis not present

## 2016-04-14 DIAGNOSIS — C9000 Multiple myeloma not having achieved remission: Secondary | ICD-10-CM | POA: Diagnosis not present

## 2016-04-14 DIAGNOSIS — C61 Malignant neoplasm of prostate: Secondary | ICD-10-CM | POA: Diagnosis not present

## 2016-04-14 DIAGNOSIS — R300 Dysuria: Secondary | ICD-10-CM | POA: Diagnosis not present

## 2016-04-14 DIAGNOSIS — Z510 Encounter for antineoplastic radiation therapy: Secondary | ICD-10-CM | POA: Diagnosis not present

## 2016-04-14 NOTE — Progress Notes (Signed)
  Radiation Oncology         [phone]   Name: Derek Castro MRN: [id number]   Date: 04/14/2016  DOB: 1940-12-02   Weekly Radiation Therapy Management    ICD-9-CM ICD-10-CM   1. Malignant neoplasm of prostate ([facility]) 185 C61     Current Dose: 10.8 Gy  Planned Dose:  68.4 Gy  Narrative The patient presents for routine under treatment assessment.  Denies pain, dysuria, or hematuria. He was given Levaquin yesterday by Shona Simpson, PA to manage nasal congestion. Reports nocturia x 5, continued incomplete emptying, frequency, intermittency and urgency, and occasional leakage. He has fatigue today because he didn't sleep well last night and he contributes this to not being able to take seroquel while taking Levaquin. Reports a cyst under testicles that has returned and it is larger and very sore (reports it's on the inside of the scrotum and is the size of a pea).  Set-up films were reviewed. The chart was checked.  Physical Findings  weight is 247 lb 6.4 oz (112.2 kg). His blood pressure is 140/67 and his pulse is 75. His respiration is 18 and oxygen saturation is 100%.  Weight essentially stable.  No significant changes.  Impression The patient is tolerating radiation.  Plan Continue treatment as planned. The Levaquin could help with the cyst if it has an infectious component. We will follow up with this next week during his next undertreatment visit or sooner if needed.         Sheral Apley Tammi Klippel, M.D.  This document serves as a record of services personally performed by Tyler Pita, MD. It was created on his behalf by Darcus Austin, a trained medical scribe. The creation of this record is based on the scribe's personal observations and the provider's statements to them. This document has been checked and approved by the attending provider.

## 2016-04-14 NOTE — Progress Notes (Signed)
Weight and vitals stable. Denies pain. Given medication yesterday by Bryson Ha to manage nasal congestion. Reports nocturia x 5. Denies dysuria or hematuria. Reports continued incomplete emptying, frequency, intermittency and urgency. Reports occasional leakage. Reports fatigue today because he didn't sleep well last night. He contributes this to not being able to take seroquel while taking new new medication prescribed. Reports cyst under testicles has returned is larger and very sore.  BP 140/67 (BP Location: Left Arm, Patient Position: Sitting, Cuff Size: Large)   Pulse 75   Resp 18   Wt 247 lb 6.4 oz (112.2 kg)   SpO2 100%  Wt Readings from Last 3 Encounters:  04/14/16 247 lb 6.4 oz (112.2 kg)  04/08/16 249 lb 9.6 oz (113.2 kg)  03/28/16 253 lb (114.8 kg)

## 2016-04-14 NOTE — Progress Notes (Signed)
Radiation Oncology         [phone] ________________________________  Name: Derek Castro MRN: [id number]  Date: 04/13/2016  DOB: 12-20-1940  Post Treatment Note  CC: No primary care provider on file.  Ramon Dredge, MD  Diagnosis:   Recurrent biochemical prostatic adenocarcinoma. At time of diagnosis in 2013, Gleason Score was 4+3 and PSA 3.6  Narrative:  The patient presents today for scheduled appointment due to concerns for possible bronchitis versus pneumonia. The patient reports that in the last week, he has had an increase in a cough that has become productive with thick yellow to green sputum, he does not know of any febrile episodes but states he's had a few episodes of the hot, he states that he has not had any upper sinus congestion, and denies any postnasal drip. He has not tried over-the-counter regimens at this point in time due to concern about his blood pressure. He states he does have some pain when taking deep breaths in the right chest. No other complaints or verbalized.                              ALLERGIES:  has No Known Allergies.  Meds: Current Outpatient Prescriptions  Medication Sig Dispense Refill  . acetaminophen (TYLENOL) 325 MG tablet Take by mouth.    Marland Kitchen acyclovir (ZOVIRAX) 400 MG tablet Take by mouth.    Marland Kitchen amLODipine (NORVASC) 10 MG tablet Take 10 mg by mouth daily.  0  . bortezomib IV (VELCADE) 3.5 MG injection Inject into the skin.    . fluorometholone (FML) 0.1 % ophthalmic suspension INSTILL 1 DROP INTO BOTH EYES TWICE A DAY  12  . FLUoxetine (PROZAC) 20 MG capsule Take 20 mg by mouth daily.  0  . levofloxacin (LEVAQUIN) 500 MG tablet Take 1 tablet (500 mg total) by mouth daily. 7 tablet 0  . ondansetron (ZOFRAN) 8 MG tablet TAKE 1 TABLET (8 MG TOTAL) BY MOUTH EVERY 8 (EIGHT) HOURS AS NEEDED FOR NAUSEA FOR UP TO 30 DOSES.  3  . QUEtiapine (SEROQUEL) 100 MG tablet Take by mouth.    . zolendronic acid (ZOMETA) 4 MG/5ML injection Inject into  the vein.    Marland Kitchen zolpidem (AMBIEN) 10 MG tablet Take by mouth.     No current facility-administered medications for this encounter.     Physical Findings:  vitals were not taken for this visit. In general this is a well appearing Caucasian male in no acute distress. He is alert and oriented x4 and appropriate throughout the examination. HEENT reveals that the patient is normocephalic, atraumatic. EOMs are intact. PERRLA. Skin is intact without any evidence of gross lesions. Cardiovascular exam reveals a regular rate and rhythm, no clicks rubs or murmurs are auscultated. Chest is clear to auscultation in the left fields. In the right, at the apex and mid fields there are crackles and coarse breath sounds with some decreased breath sounds in the mid fields concerning for consolidation. Lymphatic assessment is performed and does not reveal any adenopathy in the  supraclavicular, or axillary chains though there is increased cervical adenopathy. Abdomen has active bowel sounds in all quadrants and is intact. The abdomen is soft, non tender, non distended. Lower extremities are negative for pretibial pitting edema, deep calf tenderness, cyanosis or clubbing.   Lab Findings: No results found for: WBC, HGB, HCT, MCV, PLT   Radiographic Findings: No results found.  Impression/Plan: 1. Recurrent biochemical  prostatic adenocarcinoma. At time of diagnosis in 2013, Gleason Score was 4+3 and PSA 3.6. The patient is doing well overall since beginning treatment and will continue this week. He will return for his under treatment visit on Thursday.  2. Probable community acquired pneumonia. The patient was given a prescription for levaquin 500 mg #10, one tab po daily for 10 days. We will follow this expectantly and he will keep [place] informed of any questions or concerns.      Carola Rhine, PAC

## 2016-04-15 ENCOUNTER — Ambulatory Visit
Admission: RE | Admit: 2016-04-15 | Discharge: 2016-04-15 | Disposition: A | Discharge status: Home / Self Care | Payer: Medicare Other | Source: Ambulatory Visit | Attending: Radiation Oncology | Admitting: Radiation Oncology

## 2016-04-15 DIAGNOSIS — Z923 Personal history of irradiation: Secondary | ICD-10-CM | POA: Diagnosis not present

## 2016-04-15 DIAGNOSIS — C9000 Multiple myeloma not having achieved remission: Secondary | ICD-10-CM | POA: Diagnosis not present

## 2016-04-15 DIAGNOSIS — C61 Malignant neoplasm of prostate: Secondary | ICD-10-CM | POA: Diagnosis not present

## 2016-04-15 DIAGNOSIS — Z510 Encounter for antineoplastic radiation therapy: Secondary | ICD-10-CM | POA: Diagnosis not present

## 2016-04-15 DIAGNOSIS — R300 Dysuria: Secondary | ICD-10-CM | POA: Diagnosis not present

## 2016-04-15 DIAGNOSIS — M1990 Unspecified osteoarthritis, unspecified site: Secondary | ICD-10-CM | POA: Diagnosis not present

## 2016-04-15 NOTE — Progress Notes (Signed)
Mr. Derek Castro states he has ? Bronchitis. He is requesting an antibiotic. I informed him that he will need to see Shona Simpson, PA to evaluate him. Appointment scheduled and patient will be seen after his treatment.

## 2016-04-17 ENCOUNTER — Ambulatory Visit
Admission: RE | Admit: 2016-04-17 | Discharge: 2016-04-17 | Disposition: A | Discharge status: Home / Self Care | Payer: Medicare Other | Source: Ambulatory Visit | Attending: Radiation Oncology | Admitting: Radiation Oncology

## 2016-04-17 DIAGNOSIS — R300 Dysuria: Secondary | ICD-10-CM | POA: Diagnosis not present

## 2016-04-17 DIAGNOSIS — M1990 Unspecified osteoarthritis, unspecified site: Secondary | ICD-10-CM | POA: Diagnosis not present

## 2016-04-17 DIAGNOSIS — C61 Malignant neoplasm of prostate: Secondary | ICD-10-CM | POA: Diagnosis not present

## 2016-04-17 DIAGNOSIS — Z510 Encounter for antineoplastic radiation therapy: Secondary | ICD-10-CM | POA: Diagnosis not present

## 2016-04-17 DIAGNOSIS — C9000 Multiple myeloma not having achieved remission: Secondary | ICD-10-CM | POA: Diagnosis not present

## 2016-04-17 DIAGNOSIS — Z923 Personal history of irradiation: Secondary | ICD-10-CM | POA: Diagnosis not present

## 2016-04-18 ENCOUNTER — Ambulatory Visit
Admission: RE | Admit: 2016-04-18 | Discharge: 2016-04-18 | Disposition: A | Discharge status: Home / Self Care | Payer: Medicare Other | Source: Ambulatory Visit | Attending: Radiation Oncology | Admitting: Radiation Oncology

## 2016-04-18 DIAGNOSIS — R300 Dysuria: Secondary | ICD-10-CM | POA: Diagnosis not present

## 2016-04-18 DIAGNOSIS — C61 Malignant neoplasm of prostate: Secondary | ICD-10-CM | POA: Diagnosis not present

## 2016-04-18 DIAGNOSIS — M1990 Unspecified osteoarthritis, unspecified site: Secondary | ICD-10-CM | POA: Diagnosis not present

## 2016-04-18 DIAGNOSIS — C9000 Multiple myeloma not having achieved remission: Secondary | ICD-10-CM | POA: Diagnosis not present

## 2016-04-18 DIAGNOSIS — Z923 Personal history of irradiation: Secondary | ICD-10-CM | POA: Diagnosis not present

## 2016-04-18 DIAGNOSIS — Z510 Encounter for antineoplastic radiation therapy: Secondary | ICD-10-CM | POA: Diagnosis not present

## 2016-04-19 ENCOUNTER — Ambulatory Visit
Admission: RE | Admit: 2016-04-19 | Discharge: 2016-04-19 | Disposition: A | Discharge status: Home / Self Care | Payer: Medicare Other | Source: Ambulatory Visit | Attending: Radiation Oncology | Admitting: Radiation Oncology

## 2016-04-19 ENCOUNTER — Telehealth: Payer: Self-pay | Admitting: *Deleted

## 2016-04-19 DIAGNOSIS — C61 Malignant neoplasm of prostate: Secondary | ICD-10-CM | POA: Diagnosis not present

## 2016-04-19 DIAGNOSIS — Z923 Personal history of irradiation: Secondary | ICD-10-CM | POA: Diagnosis not present

## 2016-04-19 DIAGNOSIS — C9000 Multiple myeloma not having achieved remission: Secondary | ICD-10-CM | POA: Diagnosis not present

## 2016-04-19 DIAGNOSIS — Z510 Encounter for antineoplastic radiation therapy: Secondary | ICD-10-CM | POA: Diagnosis not present

## 2016-04-19 DIAGNOSIS — M1990 Unspecified osteoarthritis, unspecified site: Secondary | ICD-10-CM | POA: Diagnosis not present

## 2016-04-19 DIAGNOSIS — R300 Dysuria: Secondary | ICD-10-CM | POA: Diagnosis not present

## 2016-04-19 NOTE — Telephone Encounter (Signed)
Called patient to inform of appt. With Derek Castro on 04-25-16 - arrival time - 12:45 p.m., spoke with patient and he is aware of this appt.

## 2016-04-20 ENCOUNTER — Ambulatory Visit
Admission: RE | Admit: 2016-04-20 | Discharge: 2016-04-20 | Disposition: A | Discharge status: Home / Self Care | Payer: Medicare Other | Source: Ambulatory Visit | Attending: Radiation Oncology | Admitting: Radiation Oncology

## 2016-04-20 VITALS — BP 143/61 | HR 66 | Resp 16 | Wt 247.0 lb

## 2016-04-20 DIAGNOSIS — C61 Malignant neoplasm of prostate: Secondary | ICD-10-CM | POA: Diagnosis not present

## 2016-04-20 DIAGNOSIS — C9000 Multiple myeloma not having achieved remission: Secondary | ICD-10-CM | POA: Diagnosis not present

## 2016-04-20 DIAGNOSIS — Z923 Personal history of irradiation: Secondary | ICD-10-CM | POA: Diagnosis not present

## 2016-04-20 DIAGNOSIS — Z510 Encounter for antineoplastic radiation therapy: Secondary | ICD-10-CM | POA: Diagnosis not present

## 2016-04-20 DIAGNOSIS — M1990 Unspecified osteoarthritis, unspecified site: Secondary | ICD-10-CM | POA: Diagnosis not present

## 2016-04-20 DIAGNOSIS — R300 Dysuria: Secondary | ICD-10-CM | POA: Diagnosis not present

## 2016-04-20 NOTE — Progress Notes (Signed)
  Radiation Oncology         [phone]   Name: Derek Castro MRN: [id number]   Date: 04/20/2016  DOB: [birth year]   Weekly Radiation Therapy Management    ICD-9-CM ICD-10-CM   1. Malignant neoplasm of prostate ([facility]) 185 C61     Current Dose: 19.8 Gy  Planned Dose:  68.4 Gy  Narrative The patient presents for routine under treatment assessment.  Weight and vitals stable. Denies pain. Reports nocturia x 3 last night which is less than the typical 5. Reports he completed his antibiotics yesterday. Reports chest congestion has improved and cyst under his testicles seems smaller and less irritated. Reports occasional leakage continues. Reports continued incomplete emptying, frequency, intermittency and urgency. Reports fatigue.  Set-up films were reviewed. The chart was checked.  Physical Findings  weight is 247 lb (112 kg). His blood pressure is 143/61 (abnormal) and his pulse is 66. His respiration is 16 and oxygen saturation is 100%.  Weight essentially stable.  No significant changes.  Impression The patient is tolerating radiation.  Plan Continue treatment as planned.          Sheral Apley Tammi Klippel, M.D.  This document serves as a record of services personally performed by Tyler Pita, MD. It was created on his behalf by Arlyce Harman, a trained medical scribe. The creation of this record is based on the scribe's personal observations and the provider's statements to them. This document has been checked and approved by the attending provider.

## 2016-04-20 NOTE — Progress Notes (Signed)
Weight and vitals stable. Denies pain. Reports nocturia x 3 last night which is less than the typical 5. Reports he has completed his antibiotics. Reports chest congestion has improved and cyst under his testicles seems smaller and less irritated. Reports occasional leakage continues. Reports continued incomplete emptying, frequency, intermittency and urgency. Reports fatigue.   BP (!) 143/61   Pulse 66   Resp 16   Wt 247 lb (112 kg)   SpO2 100%  Wt Readings from Last 3 Encounters:  04/20/16 247 lb (112 kg)  04/14/16 247 lb 6.4 oz (112.2 kg)  04/08/16 249 lb 9.6 oz (113.2 kg)

## 2016-04-22 ENCOUNTER — Ambulatory Visit: Payer: Medicare Other

## 2016-04-25 ENCOUNTER — Ambulatory Visit
Admission: RE | Admit: 2016-04-25 | Discharge: 2016-04-25 | Disposition: A | Discharge status: Home / Self Care | Payer: Medicare Other | Source: Ambulatory Visit | Attending: Radiation Oncology | Admitting: Radiation Oncology

## 2016-04-25 DIAGNOSIS — C61 Malignant neoplasm of prostate: Secondary | ICD-10-CM | POA: Diagnosis not present

## 2016-04-25 DIAGNOSIS — M1990 Unspecified osteoarthritis, unspecified site: Secondary | ICD-10-CM | POA: Diagnosis not present

## 2016-04-25 DIAGNOSIS — I10 Essential (primary) hypertension: Secondary | ICD-10-CM | POA: Diagnosis not present

## 2016-04-25 DIAGNOSIS — Z510 Encounter for antineoplastic radiation therapy: Secondary | ICD-10-CM | POA: Diagnosis not present

## 2016-04-25 DIAGNOSIS — F5101 Primary insomnia: Secondary | ICD-10-CM | POA: Diagnosis not present

## 2016-04-25 DIAGNOSIS — F411 Generalized anxiety disorder: Secondary | ICD-10-CM | POA: Diagnosis not present

## 2016-04-25 DIAGNOSIS — C9000 Multiple myeloma not having achieved remission: Secondary | ICD-10-CM | POA: Diagnosis not present

## 2016-04-25 DIAGNOSIS — Z923 Personal history of irradiation: Secondary | ICD-10-CM | POA: Diagnosis not present

## 2016-04-25 DIAGNOSIS — R300 Dysuria: Secondary | ICD-10-CM | POA: Diagnosis not present

## 2016-04-26 ENCOUNTER — Ambulatory Visit
Admission: RE | Admit: 2016-04-26 | Discharge: 2016-04-26 | Disposition: A | Discharge status: Home / Self Care | Payer: Medicare Other | Source: Ambulatory Visit | Attending: Radiation Oncology | Admitting: Radiation Oncology

## 2016-04-26 DIAGNOSIS — Z510 Encounter for antineoplastic radiation therapy: Secondary | ICD-10-CM | POA: Diagnosis not present

## 2016-04-26 DIAGNOSIS — Z923 Personal history of irradiation: Secondary | ICD-10-CM | POA: Diagnosis not present

## 2016-04-26 DIAGNOSIS — C61 Malignant neoplasm of prostate: Secondary | ICD-10-CM | POA: Diagnosis not present

## 2016-04-26 DIAGNOSIS — C9000 Multiple myeloma not having achieved remission: Secondary | ICD-10-CM | POA: Diagnosis not present

## 2016-04-26 DIAGNOSIS — M1990 Unspecified osteoarthritis, unspecified site: Secondary | ICD-10-CM | POA: Diagnosis not present

## 2016-04-26 DIAGNOSIS — R300 Dysuria: Secondary | ICD-10-CM | POA: Diagnosis not present

## 2016-04-27 ENCOUNTER — Ambulatory Visit
Admission: RE | Admit: 2016-04-27 | Discharge: 2016-04-27 | Disposition: A | Discharge status: Home / Self Care | Payer: Medicare Other | Source: Ambulatory Visit | Attending: Radiation Oncology | Admitting: Radiation Oncology

## 2016-04-27 DIAGNOSIS — M1990 Unspecified osteoarthritis, unspecified site: Secondary | ICD-10-CM | POA: Diagnosis not present

## 2016-04-27 DIAGNOSIS — Z510 Encounter for antineoplastic radiation therapy: Secondary | ICD-10-CM | POA: Diagnosis not present

## 2016-04-27 DIAGNOSIS — C61 Malignant neoplasm of prostate: Secondary | ICD-10-CM | POA: Diagnosis not present

## 2016-04-27 DIAGNOSIS — R300 Dysuria: Secondary | ICD-10-CM | POA: Diagnosis not present

## 2016-04-27 DIAGNOSIS — Z923 Personal history of irradiation: Secondary | ICD-10-CM | POA: Diagnosis not present

## 2016-04-27 DIAGNOSIS — C9000 Multiple myeloma not having achieved remission: Secondary | ICD-10-CM | POA: Diagnosis not present

## 2016-04-28 ENCOUNTER — Ambulatory Visit
Admission: RE | Admit: 2016-04-28 | Discharge: 2016-04-28 | Disposition: A | Discharge status: Home / Self Care | Payer: Medicare Other | Source: Ambulatory Visit | Attending: Radiation Oncology | Admitting: Radiation Oncology

## 2016-04-28 DIAGNOSIS — C9000 Multiple myeloma not having achieved remission: Secondary | ICD-10-CM | POA: Diagnosis not present

## 2016-04-28 DIAGNOSIS — Z510 Encounter for antineoplastic radiation therapy: Secondary | ICD-10-CM | POA: Diagnosis not present

## 2016-04-28 DIAGNOSIS — C61 Malignant neoplasm of prostate: Secondary | ICD-10-CM | POA: Diagnosis not present

## 2016-04-28 DIAGNOSIS — M1990 Unspecified osteoarthritis, unspecified site: Secondary | ICD-10-CM | POA: Diagnosis not present

## 2016-04-28 DIAGNOSIS — Z923 Personal history of irradiation: Secondary | ICD-10-CM | POA: Diagnosis not present

## 2016-04-28 DIAGNOSIS — R300 Dysuria: Secondary | ICD-10-CM | POA: Diagnosis not present

## 2016-04-29 ENCOUNTER — Ambulatory Visit
Admission: RE | Admit: 2016-04-29 | Discharge: 2016-04-29 | Disposition: A | Discharge status: Home / Self Care | Payer: Medicare Other | Source: Ambulatory Visit | Attending: Radiation Oncology | Admitting: Radiation Oncology

## 2016-04-29 VITALS — BP 148/67 | HR 68 | Resp 18 | Wt 249.2 lb

## 2016-04-29 DIAGNOSIS — Z923 Personal history of irradiation: Secondary | ICD-10-CM | POA: Diagnosis not present

## 2016-04-29 DIAGNOSIS — C61 Malignant neoplasm of prostate: Secondary | ICD-10-CM

## 2016-04-29 DIAGNOSIS — Z510 Encounter for antineoplastic radiation therapy: Secondary | ICD-10-CM | POA: Diagnosis not present

## 2016-04-29 DIAGNOSIS — C9000 Multiple myeloma not having achieved remission: Secondary | ICD-10-CM | POA: Diagnosis not present

## 2016-04-29 DIAGNOSIS — R300 Dysuria: Secondary | ICD-10-CM | POA: Diagnosis not present

## 2016-04-29 DIAGNOSIS — M1990 Unspecified osteoarthritis, unspecified site: Secondary | ICD-10-CM | POA: Diagnosis not present

## 2016-04-29 NOTE — Progress Notes (Signed)
  Radiation Oncology         [phone]   Name: Derek Castro MRN: [id number]   Date: 04/29/2016  DOB: [birth year]   Weekly Radiation Therapy Management    ICD-9-CM ICD-10-CM   1. Malignant neoplasm of prostate ([facility]) 185 C61     Current Dose: 18 Gy  Planned Dose:  68.4 Gy  Narrative The patient presents for routine under treatment assessment.  Weight and vitals stable. Denies pain. Reports nocturia x 3-5. Reports chest congestion and cyst under his testicles have resolved. Reports occasional leakage continues. Reports continued incomplete emptying, frequency, intermittency and urgency. Reports fatigue. 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 The patient is on androgen depravation therapy.  Set-up films were reviewed. The chart was checked.  Physical Findings  weight is 249 lb 3.2 oz (113 kg). His blood pressure is 148/67 (abnormal) and his pulse is 68. His respiration is 18 and oxygen saturation is 100%.  Weight essentially stable.  No significant changes.  Impression The patient is tolerating radiation.  Plan Continue treatment as planned. I advised the patient to call his urologist at [facility] about his impotence.         Sheral Apley Tammi Klippel, M.D.  This document serves as a record of services personally performed by Tyler Pita, MD. It was created on his behalf by Darcus Austin, a trained medical scribe. The creation of this record is based on the scribe's personal observations and the provider's statements to them. This document has been checked and approved by the attending provider.

## 2016-04-29 NOTE — Progress Notes (Signed)
Weight and vitals stable. Denies pain. Reports nocturia x 3-5. Reports chest congestion and cyst under his testicles have resolved. Reports occasional leakage continues. Reports continued incomplete emptying, frequency, intermittency and urgency. Reports fatigue. Reports he is completely impotent and questions when this will improve.  BP (!) 148/67 (BP Location: Left Arm, Patient Position: Sitting, Cuff Size: Large)   Pulse 68   Resp 18   Wt 249 lb 3.2 oz (113 kg)   SpO2 100%  Wt Readings from Last 3 Encounters:  04/29/16 249 lb 3.2 oz (113 kg)  04/20/16 247 lb (112 kg)  04/14/16 247 lb 6.4 oz (112.2 kg)

## 2016-05-02 ENCOUNTER — Ambulatory Visit
Admission: RE | Admit: 2016-05-02 | Discharge: 2016-05-02 | Disposition: A | Discharge status: Home / Self Care | Payer: Medicare Other | Source: Ambulatory Visit | Attending: Radiation Oncology | Admitting: Radiation Oncology

## 2016-05-02 DIAGNOSIS — Z923 Personal history of irradiation: Secondary | ICD-10-CM | POA: Diagnosis not present

## 2016-05-02 DIAGNOSIS — M1990 Unspecified osteoarthritis, unspecified site: Secondary | ICD-10-CM | POA: Diagnosis not present

## 2016-05-02 DIAGNOSIS — C61 Malignant neoplasm of prostate: Secondary | ICD-10-CM | POA: Diagnosis not present

## 2016-05-02 DIAGNOSIS — C9000 Multiple myeloma not having achieved remission: Secondary | ICD-10-CM | POA: Diagnosis not present

## 2016-05-02 DIAGNOSIS — R300 Dysuria: Secondary | ICD-10-CM | POA: Diagnosis not present

## 2016-05-02 DIAGNOSIS — Z510 Encounter for antineoplastic radiation therapy: Secondary | ICD-10-CM | POA: Diagnosis not present

## 2016-05-03 ENCOUNTER — Ambulatory Visit
Admission: RE | Admit: 2016-05-03 | Discharge: 2016-05-03 | Disposition: A | Discharge status: Home / Self Care | Payer: Medicare Other | Source: Ambulatory Visit | Attending: Radiation Oncology | Admitting: Radiation Oncology

## 2016-05-03 DIAGNOSIS — Z510 Encounter for antineoplastic radiation therapy: Secondary | ICD-10-CM | POA: Diagnosis not present

## 2016-05-03 DIAGNOSIS — R300 Dysuria: Secondary | ICD-10-CM | POA: Diagnosis not present

## 2016-05-03 DIAGNOSIS — C61 Malignant neoplasm of prostate: Secondary | ICD-10-CM | POA: Diagnosis not present

## 2016-05-03 DIAGNOSIS — C9000 Multiple myeloma not having achieved remission: Secondary | ICD-10-CM | POA: Diagnosis not present

## 2016-05-03 DIAGNOSIS — M1990 Unspecified osteoarthritis, unspecified site: Secondary | ICD-10-CM | POA: Diagnosis not present

## 2016-05-03 DIAGNOSIS — Z923 Personal history of irradiation: Secondary | ICD-10-CM | POA: Diagnosis not present

## 2016-05-04 ENCOUNTER — Ambulatory Visit
Admission: RE | Admit: 2016-05-04 | Discharge: 2016-05-04 | Disposition: A | Discharge status: Home / Self Care | Payer: Medicare Other | Source: Ambulatory Visit | Attending: Radiation Oncology | Admitting: Radiation Oncology

## 2016-05-04 DIAGNOSIS — Z510 Encounter for antineoplastic radiation therapy: Secondary | ICD-10-CM | POA: Diagnosis not present

## 2016-05-04 DIAGNOSIS — C61 Malignant neoplasm of prostate: Secondary | ICD-10-CM | POA: Diagnosis not present

## 2016-05-04 DIAGNOSIS — M1990 Unspecified osteoarthritis, unspecified site: Secondary | ICD-10-CM | POA: Diagnosis not present

## 2016-05-04 DIAGNOSIS — R300 Dysuria: Secondary | ICD-10-CM | POA: Diagnosis not present

## 2016-05-04 DIAGNOSIS — Z923 Personal history of irradiation: Secondary | ICD-10-CM | POA: Diagnosis not present

## 2016-05-04 DIAGNOSIS — C9000 Multiple myeloma not having achieved remission: Secondary | ICD-10-CM | POA: Diagnosis not present

## 2016-05-05 ENCOUNTER — Ambulatory Visit
Admission: RE | Admit: 2016-05-05 | Discharge: 2016-05-05 | Disposition: A | Discharge status: Home / Self Care | Payer: Medicare Other | Source: Ambulatory Visit | Attending: Radiation Oncology | Admitting: Radiation Oncology

## 2016-05-05 ENCOUNTER — Encounter: Payer: Self-pay | Admitting: Radiation Oncology

## 2016-05-05 DIAGNOSIS — Z923 Personal history of irradiation: Secondary | ICD-10-CM | POA: Diagnosis not present

## 2016-05-05 DIAGNOSIS — C61 Malignant neoplasm of prostate: Secondary | ICD-10-CM | POA: Diagnosis not present

## 2016-05-05 DIAGNOSIS — Z510 Encounter for antineoplastic radiation therapy: Secondary | ICD-10-CM | POA: Diagnosis not present

## 2016-05-05 DIAGNOSIS — R300 Dysuria: Secondary | ICD-10-CM | POA: Diagnosis not present

## 2016-05-05 DIAGNOSIS — C9000 Multiple myeloma not having achieved remission: Secondary | ICD-10-CM | POA: Diagnosis not present

## 2016-05-05 DIAGNOSIS — M1990 Unspecified osteoarthritis, unspecified site: Secondary | ICD-10-CM | POA: Diagnosis not present

## 2016-05-06 ENCOUNTER — Ambulatory Visit
Admission: RE | Admit: 2016-05-06 | Discharge: 2016-05-06 | Disposition: A | Discharge status: Home / Self Care | Payer: Medicare Other | Source: Ambulatory Visit | Attending: Radiation Oncology | Admitting: Radiation Oncology

## 2016-05-06 DIAGNOSIS — Z510 Encounter for antineoplastic radiation therapy: Secondary | ICD-10-CM | POA: Diagnosis not present

## 2016-05-06 DIAGNOSIS — C61 Malignant neoplasm of prostate: Secondary | ICD-10-CM | POA: Diagnosis not present

## 2016-05-06 DIAGNOSIS — R300 Dysuria: Secondary | ICD-10-CM | POA: Diagnosis not present

## 2016-05-06 DIAGNOSIS — M1990 Unspecified osteoarthritis, unspecified site: Secondary | ICD-10-CM | POA: Diagnosis not present

## 2016-05-06 DIAGNOSIS — Z923 Personal history of irradiation: Secondary | ICD-10-CM | POA: Diagnosis not present

## 2016-05-06 DIAGNOSIS — C9000 Multiple myeloma not having achieved remission: Secondary | ICD-10-CM | POA: Diagnosis not present

## 2016-05-09 ENCOUNTER — Encounter: Payer: Self-pay | Admitting: Medical Oncology

## 2016-05-09 ENCOUNTER — Encounter: Payer: Self-pay | Admitting: Radiation Oncology

## 2016-05-09 ENCOUNTER — Ambulatory Visit
Admission: RE | Admit: 2016-05-09 | Discharge: 2016-05-09 | Disposition: A | Discharge status: Home / Self Care | Payer: Medicare Other | Source: Ambulatory Visit | Attending: Radiation Oncology | Admitting: Radiation Oncology

## 2016-05-09 VITALS — BP 150/77 | HR 72 | Temp 99.2°F | Resp 18 | Wt 251.0 lb

## 2016-05-09 DIAGNOSIS — C9000 Multiple myeloma not having achieved remission: Secondary | ICD-10-CM | POA: Diagnosis not present

## 2016-05-09 DIAGNOSIS — Z510 Encounter for antineoplastic radiation therapy: Secondary | ICD-10-CM | POA: Diagnosis not present

## 2016-05-09 DIAGNOSIS — C61 Malignant neoplasm of prostate: Secondary | ICD-10-CM | POA: Diagnosis not present

## 2016-05-09 DIAGNOSIS — M1990 Unspecified osteoarthritis, unspecified site: Secondary | ICD-10-CM | POA: Diagnosis not present

## 2016-05-09 DIAGNOSIS — Z923 Personal history of irradiation: Secondary | ICD-10-CM | POA: Diagnosis not present

## 2016-05-09 DIAGNOSIS — R300 Dysuria: Secondary | ICD-10-CM | POA: Diagnosis not present

## 2016-05-09 NOTE — Progress Notes (Signed)
Derek Castro states he is doing well with radiation and minimal side effects. He has frequency, urgency and feeling of not emptying his bladder completely.

## 2016-05-09 NOTE — Progress Notes (Signed)
Weight and vitals stable. Denies pain. Reports nocturia x 3-5. Reports testicular cyst remains present but, is no longer sore. Reports occasional urinary leakage continues. Reports continued incomplete emptying, frequency, intermittency and urgency. Denies any bowel complaints. Reports moderate fatigue.   BP (!) 150/77 (BP Location: Left Arm, Patient Position: Sitting, Cuff Size: Large)   Pulse 72   Temp 99.2 F (37.3 C) (Oral)   Resp 18   Wt 251 lb (113.9 kg)   SpO2 99%  Wt Readings from Last 3 Encounters:  05/09/16 251 lb (113.9 kg)  04/29/16 249 lb 3.2 oz (113 kg)  04/20/16 247 lb (112 kg)

## 2016-05-09 NOTE — Progress Notes (Signed)
  Radiation Oncology         [phone]   Name: Derek Castro MRN: [id number]   Date: 05/09/2016  DOB: 05/01/1941   Weekly Radiation Therapy Management    ICD-9-CM ICD-10-CM   1. Malignant neoplasm of prostate ([place]) 185 C61     Current Dose: 39.6 Gy  Planned Dose:  68.4 Gy  Narrative The patient presents for routine under treatment assessment.  Weight and vitals stable. The patient denies pain. Reports nocturia x 3-5. He reports testicular cyst remains present, but is no longer sore. He reports occasional urinary leakage continues. He reports continued incomplete emptying, frequency, intermittency, and urgency. Denies any bowel complaints. Reports moderate fatigue.  Set-up films were reviewed. The chart was checked.  Physical Findings  weight is 251 lb (113.9 kg). His oral temperature is 99.2 F (37.3 C). His blood pressure is 150/77 (abnormal) and his pulse is 72. His respiration is 18 and oxygen saturation is 99%.  Weight essentially stable.  No significant changes.  Impression The patient is tolerating radiation.  Plan Continue treatment as planned.       Sheral Apley Tammi Klippel, M.D.  This document serves as a record of services personally performed by Tyler Pita, MD. It was created on his behalf by Maryla Morrow, a trained medical scribe. The creation of this record is based on the scribe's personal observations and the provider's statements to them. This document has been checked and approved by the attending provider.

## 2016-05-10 ENCOUNTER — Ambulatory Visit
Admission: RE | Admit: 2016-05-10 | Discharge: 2016-05-10 | Disposition: A | Discharge status: Home / Self Care | Payer: Medicare Other | Source: Ambulatory Visit | Attending: Radiation Oncology | Admitting: Radiation Oncology

## 2016-05-10 DIAGNOSIS — M1990 Unspecified osteoarthritis, unspecified site: Secondary | ICD-10-CM | POA: Diagnosis not present

## 2016-05-10 DIAGNOSIS — Z923 Personal history of irradiation: Secondary | ICD-10-CM | POA: Diagnosis not present

## 2016-05-10 DIAGNOSIS — C61 Malignant neoplasm of prostate: Secondary | ICD-10-CM | POA: Diagnosis not present

## 2016-05-10 DIAGNOSIS — C9000 Multiple myeloma not having achieved remission: Secondary | ICD-10-CM | POA: Diagnosis not present

## 2016-05-10 DIAGNOSIS — R300 Dysuria: Secondary | ICD-10-CM | POA: Diagnosis not present

## 2016-05-10 DIAGNOSIS — Z510 Encounter for antineoplastic radiation therapy: Secondary | ICD-10-CM | POA: Diagnosis not present

## 2016-05-11 ENCOUNTER — Ambulatory Visit
Admission: RE | Admit: 2016-05-11 | Discharge: 2016-05-11 | Disposition: A | Discharge status: Home / Self Care | Payer: Medicare Other | Source: Ambulatory Visit | Attending: Radiation Oncology | Admitting: Radiation Oncology

## 2016-05-11 DIAGNOSIS — R300 Dysuria: Secondary | ICD-10-CM | POA: Diagnosis not present

## 2016-05-11 DIAGNOSIS — M1990 Unspecified osteoarthritis, unspecified site: Secondary | ICD-10-CM | POA: Diagnosis not present

## 2016-05-11 DIAGNOSIS — Z510 Encounter for antineoplastic radiation therapy: Secondary | ICD-10-CM | POA: Diagnosis not present

## 2016-05-11 DIAGNOSIS — Z923 Personal history of irradiation: Secondary | ICD-10-CM | POA: Diagnosis not present

## 2016-05-11 DIAGNOSIS — C61 Malignant neoplasm of prostate: Secondary | ICD-10-CM | POA: Diagnosis not present

## 2016-05-11 DIAGNOSIS — C9000 Multiple myeloma not having achieved remission: Secondary | ICD-10-CM | POA: Diagnosis not present

## 2016-05-12 ENCOUNTER — Ambulatory Visit
Admission: RE | Admit: 2016-05-12 | Discharge: 2016-05-12 | Disposition: A | Discharge status: Home / Self Care | Payer: Medicare Other | Source: Ambulatory Visit | Attending: Radiation Oncology | Admitting: Radiation Oncology

## 2016-05-12 DIAGNOSIS — Z923 Personal history of irradiation: Secondary | ICD-10-CM | POA: Diagnosis not present

## 2016-05-12 DIAGNOSIS — Z510 Encounter for antineoplastic radiation therapy: Secondary | ICD-10-CM | POA: Diagnosis not present

## 2016-05-12 DIAGNOSIS — C61 Malignant neoplasm of prostate: Secondary | ICD-10-CM | POA: Diagnosis not present

## 2016-05-12 DIAGNOSIS — R300 Dysuria: Secondary | ICD-10-CM | POA: Diagnosis not present

## 2016-05-12 DIAGNOSIS — M1990 Unspecified osteoarthritis, unspecified site: Secondary | ICD-10-CM | POA: Diagnosis not present

## 2016-05-12 DIAGNOSIS — C9000 Multiple myeloma not having achieved remission: Secondary | ICD-10-CM | POA: Diagnosis not present

## 2016-05-13 ENCOUNTER — Encounter: Payer: Self-pay | Admitting: Radiation Oncology

## 2016-05-13 ENCOUNTER — Ambulatory Visit
Admission: RE | Admit: 2016-05-13 | Discharge: 2016-05-13 | Disposition: A | Discharge status: Home / Self Care | Payer: Medicare Other | Source: Ambulatory Visit | Attending: Radiation Oncology | Admitting: Radiation Oncology

## 2016-05-13 VITALS — BP 143/62 | HR 65 | Temp 98.5°F | Resp 20 | Ht 69.0 in | Wt 250.2 lb

## 2016-05-13 DIAGNOSIS — C9000 Multiple myeloma not having achieved remission: Secondary | ICD-10-CM | POA: Diagnosis not present

## 2016-05-13 DIAGNOSIS — Z510 Encounter for antineoplastic radiation therapy: Secondary | ICD-10-CM | POA: Diagnosis not present

## 2016-05-13 DIAGNOSIS — C61 Malignant neoplasm of prostate: Secondary | ICD-10-CM

## 2016-05-13 DIAGNOSIS — M1990 Unspecified osteoarthritis, unspecified site: Secondary | ICD-10-CM | POA: Diagnosis not present

## 2016-05-13 DIAGNOSIS — R300 Dysuria: Secondary | ICD-10-CM | POA: Diagnosis not present

## 2016-05-13 DIAGNOSIS — Z923 Personal history of irradiation: Secondary | ICD-10-CM | POA: Diagnosis not present

## 2016-05-13 NOTE — Progress Notes (Signed)
  Radiation Oncology         [phone]   Name: Derek Castro MRN: [id number]   Date: 05/13/2016  DOB: 1941-05-30   Weekly Radiation Therapy Management    ICD-9-CM ICD-10-CM   1. Malignant neoplasm of prostate ([facility]) 185 C61     Current Dose: 46.8 Gy  Planned Dose:  68.4 Gy  Narrative The patient presents for routine under treatment assessment.  Denies pain. Reports nocturia x 5, continuing occasional leakage, continued incomplete emptying, frequency, intermittency, and urgency. Reports fatigue. Reports he is completely impotent and questions when this will improve.  Reports diarrhea, but did not need to take Imodium.  Lupron 45 given in September at [facility].  Set-up films were reviewed. The chart was checked.  Physical Findings  height is 5\' 9"  (1.753 m) and weight is 250 lb 3.2 oz (113.5 kg). His oral temperature is 98.5 F (36.9 C). His blood pressure is 143/62 (abnormal) and his pulse is 65. His respiration is 20 and oxygen saturation is 100%.  Weight essentially stable.  No significant changes.  Impression The patient is tolerating radiation. The patient had questions pertaining to his impotence and I advised him to direct these questions to his urologist.  Plan Continue treatment as planned.       Sheral Apley Tammi Klippel, M.D.  This document serves as a record of services personally performed by Tyler Pita, MD. It was created on his behalf by Darcus Austin, a trained medical scribe. The creation of this record is based on the scribe's personal observations and the provider's statements to them. This document has been checked and approved by the attending provider.

## 2016-05-13 NOTE — Progress Notes (Signed)
Weight and vitals stable. Denies pain. Reports nocturia x 5.  Reports occasional leakage continues. Reports continued incomplete emptying, frequency, intermittency and urgency. Reports fatigue. Reports he is completely impotent and questions when this will improve.  Reports diarrhea did not need to take Imodium. Wt Readings from Last 3 Encounters:  05/13/16 250 lb 3.2 oz (113.5 kg)  05/09/16 251 lb (113.9 kg)  04/29/16 249 lb 3.2 oz (113 kg)  BP (!) 143/62   Pulse 65   Temp 98.5 F (36.9 C) (Oral)   Resp 20   Ht 5\' 9"  (1.753 m)   Wt 250 lb 3.2 oz (113.5 kg)   SpO2 100%   BMI 36.95 kg/m

## 2016-05-16 ENCOUNTER — Ambulatory Visit
Admission: RE | Admit: 2016-05-16 | Discharge: 2016-05-16 | Disposition: A | Discharge status: Home / Self Care | Payer: Medicare Other | Source: Ambulatory Visit | Attending: Radiation Oncology | Admitting: Radiation Oncology

## 2016-05-16 DIAGNOSIS — Z923 Personal history of irradiation: Secondary | ICD-10-CM | POA: Diagnosis not present

## 2016-05-16 DIAGNOSIS — C61 Malignant neoplasm of prostate: Secondary | ICD-10-CM | POA: Diagnosis not present

## 2016-05-16 DIAGNOSIS — C9000 Multiple myeloma not having achieved remission: Secondary | ICD-10-CM | POA: Diagnosis not present

## 2016-05-16 DIAGNOSIS — Z510 Encounter for antineoplastic radiation therapy: Secondary | ICD-10-CM | POA: Diagnosis not present

## 2016-05-16 DIAGNOSIS — M1990 Unspecified osteoarthritis, unspecified site: Secondary | ICD-10-CM | POA: Diagnosis not present

## 2016-05-16 DIAGNOSIS — R300 Dysuria: Secondary | ICD-10-CM | POA: Diagnosis not present

## 2016-05-17 ENCOUNTER — Ambulatory Visit
Admission: RE | Admit: 2016-05-17 | Discharge: 2016-05-17 | Disposition: A | Discharge status: Home / Self Care | Payer: Medicare Other | Source: Ambulatory Visit | Attending: Radiation Oncology | Admitting: Radiation Oncology

## 2016-05-17 DIAGNOSIS — Z510 Encounter for antineoplastic radiation therapy: Secondary | ICD-10-CM | POA: Diagnosis not present

## 2016-05-17 DIAGNOSIS — C61 Malignant neoplasm of prostate: Secondary | ICD-10-CM | POA: Diagnosis not present

## 2016-05-17 DIAGNOSIS — M1990 Unspecified osteoarthritis, unspecified site: Secondary | ICD-10-CM | POA: Diagnosis not present

## 2016-05-17 DIAGNOSIS — C9000 Multiple myeloma not having achieved remission: Secondary | ICD-10-CM | POA: Diagnosis not present

## 2016-05-17 DIAGNOSIS — Z923 Personal history of irradiation: Secondary | ICD-10-CM | POA: Diagnosis not present

## 2016-05-17 DIAGNOSIS — R300 Dysuria: Secondary | ICD-10-CM | POA: Diagnosis not present

## 2016-05-18 ENCOUNTER — Ambulatory Visit
Admission: RE | Admit: 2016-05-18 | Discharge: 2016-05-18 | Disposition: A | Discharge status: Home / Self Care | Payer: Medicare Other | Source: Ambulatory Visit | Attending: Radiation Oncology | Admitting: Radiation Oncology

## 2016-05-18 DIAGNOSIS — C61 Malignant neoplasm of prostate: Secondary | ICD-10-CM | POA: Diagnosis not present

## 2016-05-18 DIAGNOSIS — C9000 Multiple myeloma not having achieved remission: Secondary | ICD-10-CM | POA: Diagnosis not present

## 2016-05-18 DIAGNOSIS — R300 Dysuria: Secondary | ICD-10-CM | POA: Diagnosis not present

## 2016-05-18 DIAGNOSIS — Z923 Personal history of irradiation: Secondary | ICD-10-CM | POA: Diagnosis not present

## 2016-05-18 DIAGNOSIS — Z510 Encounter for antineoplastic radiation therapy: Secondary | ICD-10-CM | POA: Diagnosis not present

## 2016-05-18 DIAGNOSIS — M1990 Unspecified osteoarthritis, unspecified site: Secondary | ICD-10-CM | POA: Diagnosis not present

## 2016-05-19 ENCOUNTER — Ambulatory Visit
Admission: RE | Admit: 2016-05-19 | Discharge: 2016-05-19 | Disposition: A | Discharge status: Home / Self Care | Payer: Medicare Other | Source: Ambulatory Visit | Attending: Radiation Oncology | Admitting: Radiation Oncology

## 2016-05-19 DIAGNOSIS — R300 Dysuria: Secondary | ICD-10-CM | POA: Diagnosis not present

## 2016-05-19 DIAGNOSIS — M1990 Unspecified osteoarthritis, unspecified site: Secondary | ICD-10-CM | POA: Diagnosis not present

## 2016-05-19 DIAGNOSIS — C61 Malignant neoplasm of prostate: Secondary | ICD-10-CM | POA: Diagnosis not present

## 2016-05-19 DIAGNOSIS — Z510 Encounter for antineoplastic radiation therapy: Secondary | ICD-10-CM | POA: Diagnosis not present

## 2016-05-19 DIAGNOSIS — C9000 Multiple myeloma not having achieved remission: Secondary | ICD-10-CM | POA: Diagnosis not present

## 2016-05-19 DIAGNOSIS — Z923 Personal history of irradiation: Secondary | ICD-10-CM | POA: Diagnosis not present

## 2016-05-20 ENCOUNTER — Ambulatory Visit
Admission: RE | Admit: 2016-05-20 | Discharge: 2016-05-20 | Disposition: A | Discharge status: Home / Self Care | Payer: Medicare Other | Source: Ambulatory Visit | Attending: Radiation Oncology | Admitting: Radiation Oncology

## 2016-05-20 ENCOUNTER — Encounter: Payer: Self-pay | Admitting: Radiation Oncology

## 2016-05-20 VITALS — BP 158/83 | HR 68 | Resp 18 | Wt 254.2 lb

## 2016-05-20 DIAGNOSIS — Z510 Encounter for antineoplastic radiation therapy: Secondary | ICD-10-CM | POA: Diagnosis not present

## 2016-05-20 DIAGNOSIS — C61 Malignant neoplasm of prostate: Secondary | ICD-10-CM

## 2016-05-20 DIAGNOSIS — Z923 Personal history of irradiation: Secondary | ICD-10-CM | POA: Diagnosis not present

## 2016-05-20 DIAGNOSIS — C9000 Multiple myeloma not having achieved remission: Secondary | ICD-10-CM | POA: Diagnosis not present

## 2016-05-20 DIAGNOSIS — M1990 Unspecified osteoarthritis, unspecified site: Secondary | ICD-10-CM | POA: Diagnosis not present

## 2016-05-20 DIAGNOSIS — R300 Dysuria: Secondary | ICD-10-CM | POA: Diagnosis not present

## 2016-05-20 NOTE — Progress Notes (Signed)
  Radiation Oncology         [phone]   Name: Derek Castro MRN: [id number]   Date: 05/20/2016  DOB: April 03, 1941   Weekly Radiation Therapy Management    ICD-9-CM ICD-10-CM   1. Malignant neoplasm of prostate ([facility]) 185 C61     Current Dose: 55.8 Gy  Planned Dose:  68.4 Gy  Narrative The patient presents for routine under treatment assessment.  Weight and vitals stable. Denies pain. Reports nocturia x 5. Reports increased urinary urgency with little output. Reports continued incomplete emptying, frequency, and intermittency. Reports fatigue.  Lupron 45 given in September at [facility].  Set-up films were reviewed. The chart was checked.  Physical Findings  weight is 254 lb 3.2 oz (115.3 kg). His blood pressure is 158/83 (abnormal) and his pulse is 68. His respiration is 18 and oxygen saturation is 100%.  Weight essentially stable.  No significant changes.  Impression The patient is tolerating radiation.  Plan Continue treatment as planned. I discussed with the patient that he may be experiencing bladder spasms causing his urgency; this should subside a few weeks after treatment ends.      Sheral Apley Tammi Klippel, M.D.  This document serves as a record of services personally performed by Tyler Pita, MD. It was created on his behalf by Maryla Morrow, a trained medical scribe. The creation of this record is based on the scribe's personal observations and the provider's statements to them. This document has been checked and approved by the attending provider.

## 2016-05-20 NOTE — Progress Notes (Signed)
Weight and vitals stable. Denies pain. Reports nocturia x 5. Reports increased urinary urgency with little output. Reports continued incomplete emptying, frequency, and intermittency. Reports fatigue.   BP (!) 158/83 (BP Location: Right Arm, Patient Position: Sitting, Cuff Size: Large)   Pulse 68   Resp 18   Wt 254 lb 3.2 oz (115.3 kg)   SpO2 100%   BMI 37.54 kg/m  Wt Readings from Last 3 Encounters:  05/20/16 254 lb 3.2 oz (115.3 kg)  05/13/16 250 lb 3.2 oz (113.5 kg)  05/09/16 251 lb (113.9 kg)

## 2016-05-24 ENCOUNTER — Ambulatory Visit
Admission: RE | Admit: 2016-05-24 | Discharge: 2016-05-24 | Disposition: A | Discharge status: Home / Self Care | Payer: Medicare Other | Source: Ambulatory Visit | Attending: Radiation Oncology | Admitting: Radiation Oncology

## 2016-05-24 DIAGNOSIS — M1990 Unspecified osteoarthritis, unspecified site: Secondary | ICD-10-CM | POA: Diagnosis not present

## 2016-05-24 DIAGNOSIS — C61 Malignant neoplasm of prostate: Secondary | ICD-10-CM | POA: Diagnosis not present

## 2016-05-24 DIAGNOSIS — R300 Dysuria: Secondary | ICD-10-CM | POA: Diagnosis not present

## 2016-05-24 DIAGNOSIS — C9000 Multiple myeloma not having achieved remission: Secondary | ICD-10-CM | POA: Diagnosis not present

## 2016-05-24 DIAGNOSIS — Z923 Personal history of irradiation: Secondary | ICD-10-CM | POA: Diagnosis not present

## 2016-05-24 DIAGNOSIS — Z510 Encounter for antineoplastic radiation therapy: Secondary | ICD-10-CM | POA: Diagnosis not present

## 2016-05-25 ENCOUNTER — Ambulatory Visit
Admission: RE | Admit: 2016-05-25 | Discharge: 2016-05-25 | Disposition: A | Discharge status: Home / Self Care | Payer: Medicare Other | Source: Ambulatory Visit | Attending: Radiation Oncology | Admitting: Radiation Oncology

## 2016-05-25 DIAGNOSIS — Z510 Encounter for antineoplastic radiation therapy: Secondary | ICD-10-CM | POA: Diagnosis not present

## 2016-05-25 DIAGNOSIS — M1990 Unspecified osteoarthritis, unspecified site: Secondary | ICD-10-CM | POA: Diagnosis not present

## 2016-05-25 DIAGNOSIS — C9000 Multiple myeloma not having achieved remission: Secondary | ICD-10-CM | POA: Diagnosis not present

## 2016-05-25 DIAGNOSIS — R300 Dysuria: Secondary | ICD-10-CM | POA: Diagnosis not present

## 2016-05-25 DIAGNOSIS — C61 Malignant neoplasm of prostate: Secondary | ICD-10-CM | POA: Diagnosis not present

## 2016-05-25 DIAGNOSIS — Z923 Personal history of irradiation: Secondary | ICD-10-CM | POA: Diagnosis not present

## 2016-05-26 ENCOUNTER — Ambulatory Visit
Admission: RE | Admit: 2016-05-26 | Discharge: 2016-05-26 | Disposition: A | Discharge status: Home / Self Care | Payer: Medicare Other | Source: Ambulatory Visit | Attending: Radiation Oncology | Admitting: Radiation Oncology

## 2016-05-26 DIAGNOSIS — Z510 Encounter for antineoplastic radiation therapy: Secondary | ICD-10-CM | POA: Diagnosis not present

## 2016-05-26 DIAGNOSIS — C9000 Multiple myeloma not having achieved remission: Secondary | ICD-10-CM | POA: Diagnosis not present

## 2016-05-26 DIAGNOSIS — Z923 Personal history of irradiation: Secondary | ICD-10-CM | POA: Diagnosis not present

## 2016-05-26 DIAGNOSIS — C61 Malignant neoplasm of prostate: Secondary | ICD-10-CM | POA: Diagnosis not present

## 2016-05-26 DIAGNOSIS — R300 Dysuria: Secondary | ICD-10-CM | POA: Diagnosis not present

## 2016-05-26 DIAGNOSIS — M1990 Unspecified osteoarthritis, unspecified site: Secondary | ICD-10-CM | POA: Diagnosis not present

## 2016-05-27 ENCOUNTER — Ambulatory Visit
Admission: RE | Admit: 2016-05-27 | Discharge: 2016-05-27 | Disposition: A | Discharge status: Home / Self Care | Payer: Medicare Other | Source: Ambulatory Visit | Attending: Radiation Oncology | Admitting: Radiation Oncology

## 2016-05-27 DIAGNOSIS — M1990 Unspecified osteoarthritis, unspecified site: Secondary | ICD-10-CM | POA: Diagnosis not present

## 2016-05-27 DIAGNOSIS — C61 Malignant neoplasm of prostate: Secondary | ICD-10-CM | POA: Diagnosis not present

## 2016-05-27 DIAGNOSIS — Z510 Encounter for antineoplastic radiation therapy: Secondary | ICD-10-CM | POA: Diagnosis not present

## 2016-05-27 DIAGNOSIS — Z923 Personal history of irradiation: Secondary | ICD-10-CM | POA: Diagnosis not present

## 2016-05-27 DIAGNOSIS — C9000 Multiple myeloma not having achieved remission: Secondary | ICD-10-CM | POA: Diagnosis not present

## 2016-05-27 DIAGNOSIS — R300 Dysuria: Secondary | ICD-10-CM | POA: Diagnosis not present

## 2016-05-31 ENCOUNTER — Ambulatory Visit
Admission: RE | Admit: 2016-05-31 | Discharge: 2016-05-31 | Disposition: A | Discharge status: Home / Self Care | Payer: Medicare Other | Source: Ambulatory Visit | Attending: Radiation Oncology | Admitting: Radiation Oncology

## 2016-05-31 DIAGNOSIS — Z87891 Personal history of nicotine dependence: Secondary | ICD-10-CM | POA: Diagnosis not present

## 2016-05-31 DIAGNOSIS — Z9484 Stem cells transplant status: Secondary | ICD-10-CM | POA: Diagnosis not present

## 2016-05-31 DIAGNOSIS — Z981 Arthrodesis status: Secondary | ICD-10-CM | POA: Diagnosis not present

## 2016-05-31 DIAGNOSIS — Z79899 Other long term (current) drug therapy: Secondary | ICD-10-CM | POA: Diagnosis not present

## 2016-05-31 DIAGNOSIS — M1990 Unspecified osteoarthritis, unspecified site: Secondary | ICD-10-CM | POA: Diagnosis not present

## 2016-05-31 DIAGNOSIS — Z923 Personal history of irradiation: Secondary | ICD-10-CM | POA: Diagnosis not present

## 2016-05-31 DIAGNOSIS — Z510 Encounter for antineoplastic radiation therapy: Secondary | ICD-10-CM | POA: Diagnosis not present

## 2016-05-31 DIAGNOSIS — Z9079 Acquired absence of other genital organ(s): Secondary | ICD-10-CM | POA: Diagnosis not present

## 2016-05-31 DIAGNOSIS — C61 Malignant neoplasm of prostate: Secondary | ICD-10-CM | POA: Diagnosis not present

## 2016-05-31 DIAGNOSIS — Z800 Family history of malignant neoplasm of digestive organs: Secondary | ICD-10-CM | POA: Diagnosis not present

## 2016-05-31 DIAGNOSIS — R300 Dysuria: Secondary | ICD-10-CM | POA: Diagnosis not present

## 2016-05-31 DIAGNOSIS — C9000 Multiple myeloma not having achieved remission: Secondary | ICD-10-CM | POA: Diagnosis not present

## 2016-06-01 ENCOUNTER — Ambulatory Visit
Admission: RE | Admit: 2016-06-01 | Discharge: 2016-06-01 | Disposition: A | Discharge status: Home / Self Care | Payer: Medicare Other | Source: Ambulatory Visit | Attending: Radiation Oncology | Admitting: Radiation Oncology

## 2016-06-01 DIAGNOSIS — R300 Dysuria: Secondary | ICD-10-CM | POA: Diagnosis not present

## 2016-06-01 DIAGNOSIS — C9000 Multiple myeloma not having achieved remission: Secondary | ICD-10-CM | POA: Diagnosis not present

## 2016-06-01 DIAGNOSIS — C61 Malignant neoplasm of prostate: Secondary | ICD-10-CM | POA: Diagnosis not present

## 2016-06-01 DIAGNOSIS — M1990 Unspecified osteoarthritis, unspecified site: Secondary | ICD-10-CM | POA: Diagnosis not present

## 2016-06-01 DIAGNOSIS — Z923 Personal history of irradiation: Secondary | ICD-10-CM | POA: Diagnosis not present

## 2016-06-01 DIAGNOSIS — Z510 Encounter for antineoplastic radiation therapy: Secondary | ICD-10-CM | POA: Diagnosis not present

## 2016-06-02 ENCOUNTER — Encounter: Payer: Self-pay | Admitting: Medical Oncology

## 2016-06-02 ENCOUNTER — Ambulatory Visit
Admission: RE | Admit: 2016-06-02 | Discharge: 2016-06-02 | Disposition: A | Discharge status: Home / Self Care | Payer: Medicare Other | Source: Ambulatory Visit | Attending: Radiation Oncology | Admitting: Radiation Oncology

## 2016-06-02 ENCOUNTER — Encounter: Payer: Self-pay | Admitting: Radiation Oncology

## 2016-06-02 VITALS — BP 155/73 | HR 69 | Temp 98.5°F | Resp 18 | Wt 253.8 lb

## 2016-06-02 DIAGNOSIS — C61 Malignant neoplasm of prostate: Secondary | ICD-10-CM

## 2016-06-02 DIAGNOSIS — Z510 Encounter for antineoplastic radiation therapy: Secondary | ICD-10-CM | POA: Diagnosis not present

## 2016-06-02 DIAGNOSIS — R300 Dysuria: Secondary | ICD-10-CM | POA: Insufficient documentation

## 2016-06-02 DIAGNOSIS — M1990 Unspecified osteoarthritis, unspecified site: Secondary | ICD-10-CM | POA: Diagnosis not present

## 2016-06-02 DIAGNOSIS — C9000 Multiple myeloma not having achieved remission: Secondary | ICD-10-CM | POA: Diagnosis not present

## 2016-06-02 DIAGNOSIS — Z923 Personal history of irradiation: Secondary | ICD-10-CM | POA: Diagnosis not present

## 2016-06-02 LAB — URINALYSIS, MICROSCOPIC - CHCC
Bilirubin (Urine): NEGATIVE
Blood: NEGATIVE
Glucose: NEGATIVE mg/dL
Ketones: NEGATIVE mg/dL
Leukocyte Esterase: NEGATIVE
Nitrite: NEGATIVE
Protein: NEGATIVE mg/dL
RBC / HPF: NEGATIVE (ref 0–2)
Specific Gravity, Urine: 1.01 (ref 1.003–1.035)
Urobilinogen, UR: 0.2 mg/dL (ref 0.2–1)
pH: 6 (ref 4.6–8.0)

## 2016-06-02 NOTE — Progress Notes (Signed)
Celebrated with Mr. Dorrough and his wife as he rang the bell after completing radiation. He has follow up with Alean Rinne 07/26/16.

## 2016-06-02 NOTE — Progress Notes (Signed)
  Radiation Oncology         [phone]   Name: Derek Castro MRN: [id number]   Date: 06/02/2016  DOB: [birth year]   Weekly Radiation Therapy Management    ICD-9-CM ICD-10-CM   1. Malignant neoplasm of prostate ([place]) 185 C61   2. Dysuria 788.1 R30.0 Urinalysis, Microscopic - [facility]     Urine culture    Current Dose: 68.4 Gy  Planned Dose:  68.4 Gy  Narrative The patient presents for routine under treatment assessment.  Weight and vitals stable. Denies pain. Reports nocturia x 5-7. Reports increased urinary urgency with little output. Reports continued incomplete emptying, frequency, dysuria, and intermittency. He also notes an odor to his urine. He notes "soft, explosive, urgent" bowel movements. Reports fatigue. He denies hematuria.   Set-up films were reviewed. The chart was checked.  Physical Findings  weight is 253 lb 12.8 oz (115.1 kg). His oral temperature is 98.5 F (36.9 C). His blood pressure is 155/73 (abnormal) and his pulse is 69. His respiration is 18 and oxygen saturation is 100%.  Weight essentially stable.  No significant changes.  Impression The patient is tolerating radiation.  Plan  A one month follow up card was given. Patient is scheduled to follow up with urology tomorrow.      Sheral Apley Tammi Klippel, M.D.  This document serves as a record of services personally performed by Tyler Pita, MD. It was created on his behalf by Bethann Humble, a trained medical scribe. The creation of this record is based on the scribe's personal observations and the provider's statements to them. This document has been checked and approved by the attending provider.

## 2016-06-02 NOTE — Progress Notes (Signed)
Weight and vitals stable. Denies pain. Reports nocturia x 5-7. Reports urinary urgency with little output continues. Reports urinary frequency. Reports intermittent stream with difficulty emptying. Reports intermittent dysuria. Denies hematuria. Reports soft "explosive" urgent bowel movements. Reports fatigue. One month follow up appointment card given. Scheduled to follow up with urologist tomorrow.  BP (!) 155/73 (BP Location: Left Arm, Patient Position: Sitting, Cuff Size: Large)   Pulse 69   Temp 98.5 F (36.9 C) (Oral)   Resp 18   Wt 253 lb 12.8 oz (115.1 kg)   SpO2 100%   BMI 37.48 kg/m  Wt Readings from Last 3 Encounters:  06/02/16 253 lb 12.8 oz (115.1 kg)  05/20/16 254 lb 3.2 oz (115.3 kg)  05/13/16 250 lb 3.2 oz (113.5 kg)

## 2016-06-03 ENCOUNTER — Encounter: Payer: Self-pay | Admitting: Radiation Oncology

## 2016-06-03 ENCOUNTER — Telehealth: Payer: Self-pay | Admitting: Radiation Oncology

## 2016-06-03 DIAGNOSIS — C61 Malignant neoplasm of prostate: Secondary | ICD-10-CM | POA: Diagnosis not present

## 2016-06-03 DIAGNOSIS — M21611 Bunion of right foot: Secondary | ICD-10-CM | POA: Diagnosis not present

## 2016-06-03 DIAGNOSIS — M79671 Pain in right foot: Secondary | ICD-10-CM | POA: Diagnosis not present

## 2016-06-03 DIAGNOSIS — M2011 Hallux valgus (acquired), right foot: Secondary | ICD-10-CM | POA: Diagnosis not present

## 2016-06-03 DIAGNOSIS — Z5181 Encounter for therapeutic drug level monitoring: Secondary | ICD-10-CM | POA: Diagnosis not present

## 2016-06-03 DIAGNOSIS — Z923 Personal history of irradiation: Secondary | ICD-10-CM | POA: Diagnosis not present

## 2016-06-03 DIAGNOSIS — M21619 Bunion of unspecified foot: Secondary | ICD-10-CM | POA: Insufficient documentation

## 2016-06-03 DIAGNOSIS — M2012 Hallux valgus (acquired), left foot: Secondary | ICD-10-CM | POA: Diagnosis not present

## 2016-06-03 DIAGNOSIS — Z8546 Personal history of malignant neoplasm of prostate: Secondary | ICD-10-CM | POA: Diagnosis not present

## 2016-06-03 DIAGNOSIS — M21612 Bunion of left foot: Secondary | ICD-10-CM | POA: Diagnosis not present

## 2016-06-03 DIAGNOSIS — C9000 Multiple myeloma not having achieved remission: Secondary | ICD-10-CM | POA: Diagnosis not present

## 2016-06-03 DIAGNOSIS — M19072 Primary osteoarthritis, left ankle and foot: Secondary | ICD-10-CM | POA: Diagnosis not present

## 2016-06-03 DIAGNOSIS — Z79899 Other long term (current) drug therapy: Secondary | ICD-10-CM | POA: Diagnosis not present

## 2016-06-03 DIAGNOSIS — M19071 Primary osteoarthritis, right ankle and foot: Secondary | ICD-10-CM | POA: Insufficient documentation

## 2016-06-03 DIAGNOSIS — M6289 Other specified disorders of muscle: Secondary | ICD-10-CM | POA: Diagnosis not present

## 2016-06-03 DIAGNOSIS — M19079 Primary osteoarthritis, unspecified ankle and foot: Secondary | ICD-10-CM | POA: Insufficient documentation

## 2016-06-03 DIAGNOSIS — M76829 Posterior tibial tendinitis, unspecified leg: Secondary | ICD-10-CM | POA: Insufficient documentation

## 2016-06-03 DIAGNOSIS — M2010 Hallux valgus (acquired), unspecified foot: Secondary | ICD-10-CM | POA: Insufficient documentation

## 2016-06-03 LAB — URINE CULTURE: Organism ID, Bacteria: NO GROWTH

## 2016-06-03 NOTE — Progress Notes (Signed)
  Radiation Oncology         [phone] ________________________________  Name: Derek Castro MRN: [id number]  Date: 06/03/2016  DOB: 01/08/1941  End of Treatment Note  Diagnosis:   Recurrent biochemical prostatic adenocarcinoma     Indication for treatment:  Curative       Radiation treatment dates:   04/07/16 - 06/02/16  Site/dose:   Prostatic Fossa treated to 68.4 Gy in 38 fractions of 1.8 Gy  Beams/energy:   6X  //  IMRT  Narrative: The patient tolerated radiation treatment relatively well. He experienced some fatigue throughout treatment. The patient experienced nocturia as frequently as 5-7 times per night. He reported increased urinary urgency over the course of treatment, with little output, as well as continuing incomplete emptying, frequency, and dysuria. He also reported increasing "soft, urgent" bowel movements.  Plan: The patient has completed radiation treatment. The patient will return to radiation oncology clinic for routine followup in one month. I advised him to call or return sooner if he has any questions or concerns related to his recovery or treatment. ________________________________  Sheral Apley. Tammi Klippel, M.D.  This document serves as a record of services personally performed by Tyler Pita, MD. It was created on his behalf by Maryla Morrow, a trained medical scribe. The creation of this record is based on the scribe's personal observations and the provider's statements to them. This document has been checked and approved by the attending provider.

## 2016-06-03 NOTE — Telephone Encounter (Signed)
-----   Message from Hayden Pedro, [place] sent at 06/03/2016 12:50 AM EST ----- Can you let him know that we will wait for his culture results before considering abx? Not quite convincing for UTI, but we will see with the culture ----- Message ----- From: Interface, Lab In Three Zero One Sent: 06/02/2016  12:07 PM To: Hayden Pedro, PA-C

## 2016-06-03 NOTE — Telephone Encounter (Signed)
Phoned patient. No answer. Left message explaining his UA is not quite convincing of a UTI but, we will see what the culture shows on Monday before considering antibiotic. Assured patient this RN will phone back on Monday with culture results. Left contact information should he have further questions.

## 2016-06-06 ENCOUNTER — Telehealth: Payer: Self-pay | Admitting: Radiation Oncology

## 2016-06-06 NOTE — Telephone Encounter (Signed)
-----   Message from Hayden Pedro, [place] sent at 06/06/2016  9:08 AM EST ----- Can you let him know no growth, no need for abx? Thanks, Bryson Ha  ----- Message ----- From: Interface, Lab In Three Zero One Sent: 06/02/2016  12:07 PM To: Hayden Pedro, PA-C

## 2016-06-06 NOTE — Telephone Encounter (Signed)
Phoned patient as ordered by Shona Simpson. Informed him that his culture had no growth thus, there is no need for antibiotics. Patient verbalized understanding and expressed appreciation for the call.

## 2016-06-09 DIAGNOSIS — M4804 Spinal stenosis, thoracic region: Secondary | ICD-10-CM | POA: Diagnosis not present

## 2016-06-09 DIAGNOSIS — C61 Malignant neoplasm of prostate: Secondary | ICD-10-CM | POA: Diagnosis not present

## 2016-06-09 DIAGNOSIS — G8929 Other chronic pain: Secondary | ICD-10-CM | POA: Diagnosis not present

## 2016-06-09 DIAGNOSIS — M79671 Pain in right foot: Secondary | ICD-10-CM | POA: Diagnosis not present

## 2016-06-09 DIAGNOSIS — M21611 Bunion of right foot: Secondary | ICD-10-CM | POA: Diagnosis not present

## 2016-06-09 DIAGNOSIS — Z5111 Encounter for antineoplastic chemotherapy: Secondary | ICD-10-CM | POA: Diagnosis not present

## 2016-06-09 DIAGNOSIS — M19071 Primary osteoarthritis, right ankle and foot: Secondary | ICD-10-CM | POA: Diagnosis not present

## 2016-06-09 DIAGNOSIS — M2012 Hallux valgus (acquired), left foot: Secondary | ICD-10-CM | POA: Diagnosis not present

## 2016-06-09 DIAGNOSIS — M2011 Hallux valgus (acquired), right foot: Secondary | ICD-10-CM | POA: Diagnosis not present

## 2016-06-09 DIAGNOSIS — Z79899 Other long term (current) drug therapy: Secondary | ICD-10-CM | POA: Diagnosis not present

## 2016-06-09 DIAGNOSIS — M21612 Bunion of left foot: Secondary | ICD-10-CM | POA: Diagnosis not present

## 2016-06-09 DIAGNOSIS — C9000 Multiple myeloma not having achieved remission: Secondary | ICD-10-CM | POA: Diagnosis not present

## 2016-06-09 DIAGNOSIS — C9001 Multiple myeloma in remission: Secondary | ICD-10-CM | POA: Diagnosis not present

## 2016-06-09 DIAGNOSIS — M5136 Other intervertebral disc degeneration, lumbar region: Secondary | ICD-10-CM | POA: Diagnosis not present

## 2016-06-10 DIAGNOSIS — L219 Seborrheic dermatitis, unspecified: Secondary | ICD-10-CM | POA: Diagnosis not present

## 2016-06-10 DIAGNOSIS — D229 Melanocytic nevi, unspecified: Secondary | ICD-10-CM | POA: Diagnosis not present

## 2016-06-10 DIAGNOSIS — L570 Actinic keratosis: Secondary | ICD-10-CM | POA: Diagnosis not present

## 2016-06-10 DIAGNOSIS — D485 Neoplasm of uncertain behavior of skin: Secondary | ICD-10-CM | POA: Diagnosis not present

## 2016-06-10 DIAGNOSIS — L308 Other specified dermatitis: Secondary | ICD-10-CM | POA: Diagnosis not present

## 2016-06-10 DIAGNOSIS — L918 Other hypertrophic disorders of the skin: Secondary | ICD-10-CM | POA: Diagnosis not present

## 2016-06-10 DIAGNOSIS — L218 Other seborrheic dermatitis: Secondary | ICD-10-CM | POA: Diagnosis not present

## 2016-06-10 DIAGNOSIS — L821 Other seborrheic keratosis: Secondary | ICD-10-CM | POA: Diagnosis not present

## 2016-06-10 DIAGNOSIS — Z85828 Personal history of other malignant neoplasm of skin: Secondary | ICD-10-CM | POA: Diagnosis not present

## 2016-06-10 DIAGNOSIS — L814 Other melanin hyperpigmentation: Secondary | ICD-10-CM | POA: Diagnosis not present

## 2016-06-21 DIAGNOSIS — Q6621 Congenital metatarsus primus varus: Secondary | ICD-10-CM | POA: Diagnosis not present

## 2016-06-21 DIAGNOSIS — M79671 Pain in right foot: Secondary | ICD-10-CM | POA: Diagnosis not present

## 2016-06-21 DIAGNOSIS — M6289 Other specified disorders of muscle: Secondary | ICD-10-CM | POA: Diagnosis not present

## 2016-06-21 DIAGNOSIS — M2011 Hallux valgus (acquired), right foot: Secondary | ICD-10-CM | POA: Diagnosis not present

## 2016-06-21 DIAGNOSIS — M21611 Bunion of right foot: Secondary | ICD-10-CM | POA: Diagnosis not present

## 2016-06-21 DIAGNOSIS — M19071 Primary osteoarthritis, right ankle and foot: Secondary | ICD-10-CM | POA: Diagnosis not present

## 2016-06-23 DIAGNOSIS — C9000 Multiple myeloma not having achieved remission: Secondary | ICD-10-CM | POA: Diagnosis not present

## 2016-07-07 DIAGNOSIS — C9000 Multiple myeloma not having achieved remission: Secondary | ICD-10-CM | POA: Diagnosis not present

## 2016-07-21 DIAGNOSIS — C9000 Multiple myeloma not having achieved remission: Secondary | ICD-10-CM | POA: Diagnosis not present

## 2016-07-25 NOTE — Progress Notes (Signed)
Derek Castro 76 y.o. man with Recurrent biochemical prostatic adenocarcinoma radiation completed 06-02-16 one month FU.  Pain: He is currently denies pain.    URINARY: He denies reports urinary frequency,urinary urgency,having incontinence and dribbling,hematuria, it is hard to postpone urination,has a stop and start pattern,hesistency, urinary retention.  Reports having incomplete emptying of his bladder or stress incontinence. Pt states he gets up to urinate    times per night. BOWEL: He reports a  bowel movement everyday/everyother day  diarrhea, normal bowel movements Fatigue:Having mild fatigue not taking a nap. Appetite:Good eating two meals per day. Weight: Wt Readings from Last 3 Encounters:  08/01/16 243 lb 3.2 oz (110.3 kg)  06/02/16 253 lb 12.8 oz (115.1 kg)  05/20/16 254 lb 3.2 oz (115.3 kg)  Weight of 10 pounds trying to lose weight Urologist:Dr. Harrision at [facility] Lupron injection every  months last dose   01-2016                                                          PSA: 0.25 ng/mi BP (!) 163/85   Pulse 67   Temp 98 F (36.7 C) (Oral)   Resp 18   Ht 5\' 9"  (1.753 m)   Wt 243 lb 3.2 oz (110.3 kg)   BMI 35.91 kg/m

## 2016-07-26 ENCOUNTER — Ambulatory Visit
Admission: RE | Admit: 2016-07-26 | Discharge: 2016-07-26 | Disposition: A | Discharge status: Home / Self Care | Payer: Medicare Other | Source: Ambulatory Visit | Attending: Radiation Oncology | Admitting: Radiation Oncology

## 2016-08-01 ENCOUNTER — Ambulatory Visit
Admission: RE | Admit: 2016-08-01 | Discharge: 2016-08-01 | Disposition: A | Discharge status: Home / Self Care | Payer: Medicare Other | Source: Ambulatory Visit | Attending: Radiation Oncology | Admitting: Radiation Oncology

## 2016-08-01 ENCOUNTER — Encounter: Payer: Self-pay | Admitting: Radiation Oncology

## 2016-08-01 VITALS — BP 163/85 | HR 67 | Temp 98.0°F | Resp 18 | Ht 69.0 in | Wt 243.2 lb

## 2016-08-01 DIAGNOSIS — Z923 Personal history of irradiation: Secondary | ICD-10-CM | POA: Diagnosis not present

## 2016-08-01 DIAGNOSIS — M1990 Unspecified osteoarthritis, unspecified site: Secondary | ICD-10-CM | POA: Insufficient documentation

## 2016-08-01 DIAGNOSIS — C61 Malignant neoplasm of prostate: Secondary | ICD-10-CM

## 2016-08-01 DIAGNOSIS — Z9889 Other specified postprocedural states: Secondary | ICD-10-CM | POA: Insufficient documentation

## 2016-08-01 DIAGNOSIS — Z79899 Other long term (current) drug therapy: Secondary | ICD-10-CM | POA: Diagnosis not present

## 2016-08-01 DIAGNOSIS — C9000 Multiple myeloma not having achieved remission: Secondary | ICD-10-CM | POA: Diagnosis not present

## 2016-08-01 NOTE — Progress Notes (Signed)
Radiation Oncology         [phone] ________________________________  Name: Derek Castro MRN: [id number]  Date: 08/01/2016  DOB: 24-Sep-1940  Post Treatment Note  CC: No primary care provider on file.  Ramon Dredge, MD  Diagnosis:   76 y.o. gentleman with biochemical recurrent prostate adenocarcinoma. At time of diagnosis in 2013, Gleason Score was 4+3 and PSA 3.6, s/p prostatectomy with a rising PSA of .25.   Interval Since Last Radiation:  8 weeks   04/07/16 - 06/02/16: Prostatic Fossa treated to 68.4 Gy in 38 fractions of 1.8 Gy   Narrative:  The patient returns today for routine follow-up.  The patient tolerated radiotherapy well but did have nocturia, incomplete emptying, and urgent bowel movements. He has a history multiple myeloma and held Velcade during treatment, however he was seen in January 2018 at [facility] with Dr. Alvie Heidelberg and has resumed this medication.  He has plans to follow-up with Dr. Aline Brochure at [facility], and his last dose of Lupron was in September 2017.                    On review of systems, the patient states he is doing well. eh continues to have poor libido and decreased erectile function and reports intermittent urinary stress incontinence. He reports he is not having any pain. He does note some urinary frequency and urgency. He denies hematuria. He denies any difficulty with bowel function, and reports that his fatigue which he noticed during treatment has improved significantly. No other complaints or verbalized.   ALLERGIES:  has No Known Allergies.  Meds: Current Outpatient Prescriptions  Medication Sig Dispense Refill  . acetaminophen (TYLENOL) 325 MG tablet Take by mouth.    Marland Kitchen acyclovir (ZOVIRAX) 400 MG tablet Take by mouth.    Marland Kitchen amLODipine (NORVASC) 10 MG tablet Take 10 mg by mouth daily.  0  . bortezomib IV (VELCADE) 3.5 MG injection Inject into the skin.    Marland Kitchen FLUoxetine (PROZAC) 20 MG capsule Take 20 mg by mouth daily.  0  . QUEtiapine  (SEROQUEL) 100 MG tablet Take by mouth.    . zolendronic acid (ZOMETA) 4 MG/5ML injection Inject into the vein.    Marland Kitchen zolpidem (AMBIEN) 10 MG tablet Take by mouth.    . ondansetron (ZOFRAN) 8 MG tablet TAKE 1 TABLET (8 MG TOTAL) BY MOUTH EVERY 8 (EIGHT) HOURS AS NEEDED FOR NAUSEA FOR UP TO 30 DOSES.  3   No current facility-administered medications for this encounter.     Physical Findings:  height is '5\' 9"'  (1.753 m) and weight is 243 lb 3.2 oz (110.3 kg). His oral temperature is 98 F (36.7 C). His blood pressure is 163/85 (abnormal) and his pulse is 67. His respiration is 18.  Pain Assessment Pain Score: 0-No pain/10 In general this is a well appearing Caucasian male in no acute distress. He's alert and oriented x4 and appropriate throughout the examination. Cardiopulmonary assessment is negative for acute distress and he exhibits normal effort.   Lab Findings: No results found for: WBC, HGB, HCT, MCV, PLT   Radiographic Findings: No results found.  Impression/Plan: 1.  76 y.o. gentleman with biochemical recurrent prostate adenocarcinoma. At time of diagnosis in 2013, Gleason Score was 4+3 and PSA 3.6, s/p prostatectomy with a rising PSA of .25. The patient appears to be doing well since conclusion of radiotherapy. He will return for further evaluation with urology at [facility] in about 3 weeks.I encouraged him to discuss  his interest in penile injections for erectile dysfunction. We would be happy to see him back as needed moving forward and outlined his surveillance schedule with urology. 2. Multiple myeloma. The patient will follow up with medical oncology at [facility] and will continue Velcade as they have outlined. 3. Osteoarthritis limiting movement. Although the patient is trying to make strides in activity, he does request a handicapped sticker for his car. 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 A signed form is provided for him to take to the [facility].    Carola Rhine, PAC

## 2016-08-01 NOTE — Addendum Note (Signed)
Encounter addended by: Malena Edman, RN on: 08/01/2016  4:38 PM<BR>    Actions taken: Charge Capture section accepted

## 2016-08-03 DIAGNOSIS — C9000 Multiple myeloma not having achieved remission: Secondary | ICD-10-CM | POA: Diagnosis not present

## 2016-08-03 DIAGNOSIS — Z923 Personal history of irradiation: Secondary | ICD-10-CM | POA: Diagnosis not present

## 2016-08-03 DIAGNOSIS — M5136 Other intervertebral disc degeneration, lumbar region: Secondary | ICD-10-CM | POA: Diagnosis not present

## 2016-08-03 DIAGNOSIS — Z7982 Long term (current) use of aspirin: Secondary | ICD-10-CM | POA: Diagnosis not present

## 2016-08-03 DIAGNOSIS — Z8546 Personal history of malignant neoplasm of prostate: Secondary | ICD-10-CM | POA: Diagnosis not present

## 2016-08-03 DIAGNOSIS — Z5111 Encounter for antineoplastic chemotherapy: Secondary | ICD-10-CM | POA: Diagnosis not present

## 2016-08-03 DIAGNOSIS — M79671 Pain in right foot: Secondary | ICD-10-CM | POA: Diagnosis not present

## 2016-08-03 DIAGNOSIS — M4804 Spinal stenosis, thoracic region: Secondary | ICD-10-CM | POA: Diagnosis not present

## 2016-08-03 DIAGNOSIS — R5383 Other fatigue: Secondary | ICD-10-CM | POA: Diagnosis not present

## 2016-08-03 DIAGNOSIS — Z79899 Other long term (current) drug therapy: Secondary | ICD-10-CM | POA: Diagnosis not present

## 2016-08-17 DIAGNOSIS — C9000 Multiple myeloma not having achieved remission: Secondary | ICD-10-CM | POA: Diagnosis not present

## 2016-09-14 DIAGNOSIS — C9000 Multiple myeloma not having achieved remission: Secondary | ICD-10-CM | POA: Diagnosis not present

## 2016-09-16 DIAGNOSIS — L918 Other hypertrophic disorders of the skin: Secondary | ICD-10-CM | POA: Diagnosis not present

## 2016-09-16 DIAGNOSIS — D1801 Hemangioma of skin and subcutaneous tissue: Secondary | ICD-10-CM | POA: Diagnosis not present

## 2016-09-16 DIAGNOSIS — L814 Other melanin hyperpigmentation: Secondary | ICD-10-CM | POA: Diagnosis not present

## 2016-09-16 DIAGNOSIS — L853 Xerosis cutis: Secondary | ICD-10-CM | POA: Diagnosis not present

## 2016-09-16 DIAGNOSIS — Z85828 Personal history of other malignant neoplasm of skin: Secondary | ICD-10-CM | POA: Diagnosis not present

## 2016-09-16 DIAGNOSIS — L570 Actinic keratosis: Secondary | ICD-10-CM | POA: Diagnosis not present

## 2016-09-16 DIAGNOSIS — Z800 Family history of malignant neoplasm of digestive organs: Secondary | ICD-10-CM | POA: Diagnosis not present

## 2016-09-16 DIAGNOSIS — H547 Unspecified visual loss: Secondary | ICD-10-CM | POA: Diagnosis not present

## 2016-09-16 DIAGNOSIS — L821 Other seborrheic keratosis: Secondary | ICD-10-CM | POA: Diagnosis not present

## 2016-09-20 DIAGNOSIS — L570 Actinic keratosis: Secondary | ICD-10-CM | POA: Diagnosis not present

## 2016-09-20 DIAGNOSIS — H029 Unspecified disorder of eyelid: Secondary | ICD-10-CM | POA: Diagnosis not present

## 2016-09-28 DIAGNOSIS — C9000 Multiple myeloma not having achieved remission: Secondary | ICD-10-CM | POA: Diagnosis not present

## 2016-10-12 DIAGNOSIS — C9000 Multiple myeloma not having achieved remission: Secondary | ICD-10-CM | POA: Diagnosis not present

## 2016-10-12 DIAGNOSIS — Z5111 Encounter for antineoplastic chemotherapy: Secondary | ICD-10-CM | POA: Diagnosis not present

## 2016-10-26 DIAGNOSIS — C9000 Multiple myeloma not having achieved remission: Secondary | ICD-10-CM | POA: Diagnosis not present

## 2016-10-26 DIAGNOSIS — Z5111 Encounter for antineoplastic chemotherapy: Secondary | ICD-10-CM | POA: Diagnosis not present

## 2016-11-01 DIAGNOSIS — S91332A Puncture wound without foreign body, left foot, initial encounter: Secondary | ICD-10-CM | POA: Diagnosis not present

## 2016-11-16 DIAGNOSIS — Z923 Personal history of irradiation: Secondary | ICD-10-CM | POA: Diagnosis not present

## 2016-11-16 DIAGNOSIS — C9000 Multiple myeloma not having achieved remission: Secondary | ICD-10-CM | POA: Diagnosis not present

## 2016-11-16 DIAGNOSIS — C61 Malignant neoplasm of prostate: Secondary | ICD-10-CM | POA: Diagnosis not present

## 2016-11-16 DIAGNOSIS — Z5112 Encounter for antineoplastic immunotherapy: Secondary | ICD-10-CM | POA: Diagnosis not present

## 2016-11-16 DIAGNOSIS — Z87891 Personal history of nicotine dependence: Secondary | ICD-10-CM | POA: Diagnosis not present

## 2016-11-16 DIAGNOSIS — C9001 Multiple myeloma in remission: Secondary | ICD-10-CM | POA: Diagnosis not present

## 2016-11-16 DIAGNOSIS — Z9079 Acquired absence of other genital organ(s): Secondary | ICD-10-CM | POA: Diagnosis not present

## 2016-11-16 DIAGNOSIS — Z981 Arthrodesis status: Secondary | ICD-10-CM | POA: Diagnosis not present

## 2016-11-16 DIAGNOSIS — Z7982 Long term (current) use of aspirin: Secondary | ICD-10-CM | POA: Diagnosis not present

## 2016-11-16 DIAGNOSIS — Z9889 Other specified postprocedural states: Secondary | ICD-10-CM | POA: Diagnosis not present

## 2016-11-16 DIAGNOSIS — Z79899 Other long term (current) drug therapy: Secondary | ICD-10-CM | POA: Diagnosis not present

## 2016-11-16 DIAGNOSIS — Z79818 Long term (current) use of other agents affecting estrogen receptors and estrogen levels: Secondary | ICD-10-CM | POA: Diagnosis not present

## 2016-11-18 ENCOUNTER — Encounter: Payer: Self-pay | Admitting: Family Medicine

## 2016-11-18 ENCOUNTER — Ambulatory Visit (INDEPENDENT_AMBULATORY_CARE_PROVIDER_SITE_OTHER): Payer: Medicare Other | Admitting: Family Medicine

## 2016-11-18 VITALS — BP 138/71 | HR 70 | Temp 98.1°F | Resp 16 | Ht 69.5 in | Wt 240.0 lb

## 2016-11-18 DIAGNOSIS — I10 Essential (primary) hypertension: Secondary | ICD-10-CM | POA: Diagnosis not present

## 2016-11-18 DIAGNOSIS — C9000 Multiple myeloma not having achieved remission: Secondary | ICD-10-CM | POA: Diagnosis not present

## 2016-11-18 DIAGNOSIS — F5101 Primary insomnia: Secondary | ICD-10-CM | POA: Diagnosis not present

## 2016-11-18 DIAGNOSIS — Z8546 Personal history of malignant neoplasm of prostate: Secondary | ICD-10-CM | POA: Diagnosis not present

## 2016-11-18 MED ORDER — QUETIAPINE FUMARATE 100 MG PO TABS: 100.0000 mg | ORAL_TABLET | Freq: Every day | ORAL | 3 refills | Status: DC

## 2016-11-18 NOTE — Progress Notes (Signed)
Office Note 11/18/2016  CC:  Chief Complaint  Patient presents with  . Establish Care    HPI:  Derek Castro is a 76 y.o. male who is here to establish care and f/u HTN and insomnia. Patient's most recent primary MD: [facility] in [place]. Old records in EPIC/HL EMR were reviewed prior to or during today's visit.  HTN: occ home monitoring always <130/80. Insomnia: resistant to many rx sleep meds in the past, but he has responded very well to quetiapine 120m qhs, needs this med RF'd today.  History of prostate cancer: Just recently followed up with his prostate ca MD and his PSA was undetectable. He was on androgen deprivation therapy until just recently per his report today.  Unknown when last CPE was done; [facility].  [facility]  Reviewed labs from this clinic dated 11/16/16 today: CBC and CMET normal (Hb 11.9 due to his chemo).  Past Medical History:  Diagnosis Date  . Bone lesion   . Depression   . Family history of colon cancer in mother   . Hyperlipidemia   . Hypertension   . Insomnia    uses quetiapine for this  . [place]    [facility]  . Nephrolithiasis   . Osteoarthritis    C-spine--hx of fusion surgery.  R foot. Knees (hx of L TKA)  . Prostate cancer ([facility]    [facility]    Past Surgical History:  Procedure Laterality Date  . ANTERIOR FUSION CERVICAL SPINE  2002  . [facility] [place]  .[place]COPY  2015   [place]  . EXCISIONAL TOTAL KNEE ARTHROPLASTY  2014 and 2016   Both knees  . LAPAROSCOPIC RETROPUBIC PROSTATECTOMY     + hx of radiation to pelvis.    Family History  Problem Relation Age of Onset  . Cancer Mother        colon    Social History   Social History  . Marital status: Married   Spouse name: N/A  . Number of children: N/A  . Years of education: N/A   Occupational History  . Not on file.   Social History Main Topics  . Smoking status: Former Smoker    Packs/day: 1.00    Years: 5.00    Types: Cigarettes    Quit date: 02/11/1996  . Smokeless tobacco: Never Used  . Alcohol use No  . Drug use: No  . Sexual activity: Not Currently   Other Topics Concern  . Not on file   Social History Narrative   Married, 3 children, 11 grandchildren.   [place]   Retired: owned a cCopywriter, advertising   No Tob/alcohol/drugs (smoke cigs x 6 yrs, quit in the 1970s).    Outpatient Encounter Prescriptions as of 11/18/2016  Medication Sig  . acetaminophen (TYLENOL) 325 MG tablet Take by mouth.  .Marland Kitchenacyclovir (ZOVIRAX) 400 MG tablet Take by mouth.  .Marland KitchenamLODipine (NORVASC) 10 MG tablet Take 10 mg by mouth daily.  .Marland Kitchenammonium lactate (AMLACTIN) 12 % cream Apply at least once daily after bathing and patting dry.  .Marland Kitchenaspirin EC 81 MG tablet Take by mouth.  . bortezomib IV (VELCADE) 3.5 MG injection Inject into the skin.  .Marland Kitchenerythromycin ophthalmic ointment apply  to incisions twice daily for 10 days  . Fluocinolone Acetonide Body 0.01 % OIL Apply daily to affected areas.  Stop when smooth.  . fluorouracil (EFUDEX) 5 % cream Apply once daily to scalp for two weeks  . FLUoxetine (PROZAC) 20 MG capsule Take 20 mg by mouth daily.  . ondansetron (ZOFRAN) 8 MG tablet TAKE 1 TABLET (8 MG TOTAL) BY MOUTH EVERY 8 (EIGHT) HOURS AS NEEDED FOR NAUSEA FOR UP TO 30 DOSES.  Marland Kitchen QUEtiapine (SEROQUEL) 100 MG tablet Take 1 tablet (100 mg total) by mouth at bedtime.  Marland Kitchen zolendronic acid (ZOMETA) 4 MG/5ML injection Inject into the vein.  Marland Kitchen zolpidem (AMBIEN) 10 MG tablet Take by mouth.  . [DISCONTINUED] QUEtiapine (SEROQUEL) 100 MG tablet Take by mouth.   No facility-administered encounter medications on file as of 11/18/2016.     No Known Allergies  ROS Review  of Systems  Constitutional: Negative for fatigue and fever.  HENT: Negative for congestion and sore throat.   Eyes: Negative for visual disturbance.  Respiratory: Negative for cough.   Cardiovascular: Negative for chest pain.  Gastrointestinal: Negative for abdominal pain and nausea.  Genitourinary: Negative for dysuria.  Musculoskeletal: Negative for back pain and joint swelling.  Skin: Negative for rash.  Neurological: Negative for weakness and headaches.  Hematological: Negative for adenopathy.    PE; Blood pressure 138/71, pulse 70, temperature 98.1 F (36.7 C), temperature source Oral, resp. rate 16, height 5' 9.5" (1.765 m), weight 240 lb (108.9 kg), SpO2 98 %. Body mass index is 34.93 kg/m.  Gen: Alert, well appearing.  Patient is oriented to person, place, time, and situation. AFFECT: pleasant, lucid thought and speech. YIR:SWNI: no injection, icteris, swelling, or exudate.  EOMI, PERRLA. Mouth: lips without lesion/swelling.  Oral mucosa pink and moist. Oropharynx without erythema, exudate, or swelling.  CV: RRR, no m/r/g.   LUNGS: CTA bilat, nonlabored resps, good aeration in all lung fields. EXT: no clubbing or cyanosis.  + pitting edema in both LL's.  Pertinent labs:  none  ASSESSMENT AND PLAN:   New pt: obtain prior PCP records.  1) HTN; The current medical regimen is effective;  continue present plan and medications. Lytes/cr normal 11/16/16 at [facility].  2) Insomnia: The current medical regimen is effective;  continue present plan and medications. RF'd quetiapine 100 mg qhs, #90, RF x 3. He takes ambien 10 mg qhs with this--no new rx needed for this med today.  3) Multiple myeloma: currently undergoing chemo treatments at [facility]. Most recent monitoring labs normal.  He feels well.  4) History of prostate cancer: he has had prostatectomy and pelvic radiation therapy; recently stopped androgen deprivation therapy. He just got f/u at [place] [facility] and PSA was undetectable.  An After Visit Summary was printed and given to the patient.  Return in about 6 months (around 05/20/2017) for annual CPE (fasting).  Signed:  Crissie Sickles, MD           11/18/2016

## 2016-11-30 DIAGNOSIS — C9000 Multiple myeloma not having achieved remission: Secondary | ICD-10-CM | POA: Diagnosis not present

## 2016-12-08 ENCOUNTER — Ambulatory Visit: Payer: Self-pay | Admitting: Family Medicine

## 2016-12-14 DIAGNOSIS — C9000 Multiple myeloma not having achieved remission: Secondary | ICD-10-CM | POA: Diagnosis not present

## 2016-12-20 DIAGNOSIS — N529 Male erectile dysfunction, unspecified: Secondary | ICD-10-CM | POA: Insufficient documentation

## 2016-12-28 DIAGNOSIS — C9000 Multiple myeloma not having achieved remission: Secondary | ICD-10-CM | POA: Diagnosis not present

## 2016-12-28 DIAGNOSIS — M6740 Ganglion, unspecified site: Secondary | ICD-10-CM | POA: Diagnosis not present

## 2016-12-28 DIAGNOSIS — Z85828 Personal history of other malignant neoplasm of skin: Secondary | ICD-10-CM | POA: Diagnosis not present

## 2016-12-28 DIAGNOSIS — L570 Actinic keratosis: Secondary | ICD-10-CM | POA: Diagnosis not present

## 2016-12-28 DIAGNOSIS — M720 Palmar fascial fibromatosis [Dupuytren]: Secondary | ICD-10-CM | POA: Diagnosis not present

## 2016-12-28 DIAGNOSIS — Z7982 Long term (current) use of aspirin: Secondary | ICD-10-CM | POA: Diagnosis not present

## 2016-12-28 DIAGNOSIS — Z5111 Encounter for antineoplastic chemotherapy: Secondary | ICD-10-CM | POA: Diagnosis not present

## 2016-12-28 DIAGNOSIS — L219 Seborrheic dermatitis, unspecified: Secondary | ICD-10-CM | POA: Diagnosis not present

## 2017-01-03 ENCOUNTER — Emergency Department (HOSPITAL_COMMUNITY)
Admission: EM | Admit: 2017-01-03 | Discharge: 2017-01-04 | Disposition: A | Discharge status: Home / Self Care | Payer: Medicare Other | Attending: Emergency Medicine | Admitting: Emergency Medicine

## 2017-01-03 ENCOUNTER — Encounter (HOSPITAL_COMMUNITY): Payer: Self-pay | Admitting: Emergency Medicine

## 2017-01-03 DIAGNOSIS — F10920 Alcohol use, unspecified with intoxication, uncomplicated: Secondary | ICD-10-CM | POA: Diagnosis not present

## 2017-01-03 DIAGNOSIS — Z79899 Other long term (current) drug therapy: Secondary | ICD-10-CM | POA: Insufficient documentation

## 2017-01-03 DIAGNOSIS — R2241 Localized swelling, mass and lump, right lower limb: Secondary | ICD-10-CM | POA: Diagnosis not present

## 2017-01-03 DIAGNOSIS — Z87891 Personal history of nicotine dependence: Secondary | ICD-10-CM | POA: Insufficient documentation

## 2017-01-03 DIAGNOSIS — I10 Essential (primary) hypertension: Secondary | ICD-10-CM | POA: Diagnosis not present

## 2017-01-03 DIAGNOSIS — W19XXXA Unspecified fall, initial encounter: Secondary | ICD-10-CM

## 2017-01-03 DIAGNOSIS — S299XXA Unspecified injury of thorax, initial encounter: Secondary | ICD-10-CM | POA: Diagnosis not present

## 2017-01-03 DIAGNOSIS — Z7982 Long term (current) use of aspirin: Secondary | ICD-10-CM | POA: Diagnosis not present

## 2017-01-03 DIAGNOSIS — S0990XA Unspecified injury of head, initial encounter: Secondary | ICD-10-CM | POA: Diagnosis not present

## 2017-01-03 DIAGNOSIS — T148XXA Other injury of unspecified body region, initial encounter: Secondary | ICD-10-CM | POA: Diagnosis not present

## 2017-01-03 DIAGNOSIS — R51 Headache: Secondary | ICD-10-CM | POA: Diagnosis not present

## 2017-01-03 DIAGNOSIS — S199XXA Unspecified injury of neck, initial encounter: Secondary | ICD-10-CM | POA: Diagnosis not present

## 2017-01-03 LAB — I-STAT TROPONIN, ED: Troponin i, poc: 0 ng/mL (ref 0.00–0.08)

## 2017-01-03 MED ORDER — SODIUM CHLORIDE 0.9 % IV BOLUS (SEPSIS)
1000.0000 mL | Freq: Once | INTRAVENOUS | Status: AC
  Administered 2017-01-03: 1000 mL via INTRAVENOUS

## 2017-01-03 MED ORDER — ONDANSETRON HCL 4 MG/2ML IJ SOLN
4.0000 mg | Freq: Once | INTRAMUSCULAR | Status: AC
  Administered 2017-01-04: 4 mg via INTRAVENOUS
  Filled 2017-01-03: qty 2

## 2017-01-03 NOTE — ED Triage Notes (Signed)
Per EMS pt family found pt on floor at 8 pm supine, remembers falling but pt does not know why it happened, pt denies pain, pt vomiting on scene given 4mg  zofran, A+O x4

## 2017-01-03 NOTE — ED Provider Notes (Signed)
TIME SEEN: 11:19 PM  By signing my name below, I, Margit Banda, attest that this documentation has been prepared under the direction and in the presence of Belle Charlie, Delice Bison, DO. Electronically Signed: Margit Banda, ED Scribe. 01/03/17. 11:28 PM.  CHIEF COMPLAINT: fall  HPI: Derek Castro is a 76 y.o. male with a PMHx of HTN, HLD, multiple myeloma and prostate cancer, who presents to the Emergency Department complaining of head pain s/p falling between 6 and 8 pm tonight. Pt's wife left him around 6 pm and he was at baseline. When she returned home around 8 pm she found the pt supine on the floor. Per pt's daughter, he was pale and did not look well. Pt has had nausea and vomiting. When EMS arrived he was given oxygen because his oxygen saturation was in the mid 80s. He doesn't remember why he fell. Doesn't wear oxygen a home. Takes aspirin. No other antiplatelets or anticoagulants. Pt receives Velcade every two weeks with last chemotherapy being on Wednesday, 12/28/16 at [facility] for his multiple myeloma. No hx of seizures. Pt denies neck pain, back pain, CP, SOB, extremity pain at this time.  Before today family states he was doing well.  PCP: Tammi Sou, MD  ROS: See HPI Constitutional: no fever  Eyes: no drainage  ENT: no runny nose   Cardiovascular:  no chest pain  Resp: no SOB  GI: + vomiting GU: no dysuria Integumentary: no rash  Allergy: no hives  Musculoskeletal: no leg swelling  Neurological: no slurred speech ROS otherwise negative  PAST MEDICAL HISTORY/PAST SURGICAL HISTORY:  Past Medical History:  Diagnosis Date  . Bone lesion   . Depression   . Family history of colon cancer in mother   . Hyperlipidemia   . Hypertension   . Insomnia    uses quetiapine for this  . Multiple myeloma ([place])    [facility]  . Nephrolithiasis   . Osteoarthritis    C-spine--hx of fusion surgery.  R foot. Knees (hx of L TKA)  . Prostate cancer [facility])    [facility]    MEDICATIONS:  Prior to Admission medications   Medication Sig Start Date End Date Taking? Authorizing Provider  acetaminophen (TYLENOL) 325 MG tablet Take by mouth.    [provider]  acyclovir (ZOVIRAX) 400 MG tablet Take by mouth. 07/14/15   [provider]  amLODipine (NORVASC) 10 MG tablet Take 10 mg by mouth daily. 03/28/16   [provider]  ammonium lactate (AMLACTIN) 12 % cream Apply at least once daily after bathing and patting dry. 09/16/16   [provider]  aspirin EC 81 MG tablet Take by mouth. 08/31/13   [provider]  bortezomib IV (VELCADE) 3.5 MG injection Inject into the skin.    [provider]  erythromycin ophthalmic ointment apply to incisions twice daily for 10 days 09/20/16   [provider]  Fluocinolone Acetonide Body 0.01 % OIL Apply daily to affected areas.  Stop when smooth. 06/10/16   [provider]  fluorouracil (EFUDEX) 5 % cream Apply once daily to scalp for two weeks 09/16/16   [provider]  FLUoxetine (PROZAC) 20 MG capsule Take 20 mg by mouth daily. 03/23/16   [provider]  ondansetron (ZOFRAN) 8 MG tablet TAKE 1 TABLET (8 MG TOTAL) BY MOUTH EVERY 8 (EIGHT) HOURS AS NEEDED FOR NAUSEA FOR UP TO 30 DOSES. 01/18/16   [provider]  QUEtiapine (  SEROQUEL) 100 MG tablet Take 1 tablet (100 mg total) by mouth at bedtime. 11/18/16   McGowen, Adrian Blackwater, MD  zolendronic acid (ZOMETA) 4 MG/5ML injection Inject into the vein.    [provider]  zolpidem (AMBIEN) 10 MG tablet Take by mouth.    [provider]    ALLERGIES:  No Known Allergies  SOCIAL HISTORY:  Social History  Substance Use Topics  . Smoking status: Former Smoker    Packs/day: 1.00    Years: 5.00    Types: Cigarettes    Quit date: 02/11/1996  . Smokeless tobacco: Never Used  . Alcohol use No    FAMILY HISTORY: Family History  Problem Relation Age of Onset  .  Cancer Mother        colon    EXAM: BP (!) 165/75 (BP Location: Right Arm)   Pulse (!) 56   Temp 97.6 F (36.4 C) (Oral)   Resp 18   SpO2 96%  CONSTITUTIONAL: Alert and oriented x 3 and responds appropriately to questions. Elderly, chronically ill-appearing, in no distress, GCS 15 HEAD: Normocephalic; atraumatic EYES: Conjunctivae clear, PERRL, EOMI ENT: normal nose; no rhinorrhea; moist mucous membranes; pharynx without lesions noted; no dental injury; no septal hematoma NECK: Supple, no meningismus, no LAD; no midline spinal tenderness, step-off or deformity; trachea midline CARD: RRR; S1 and S2 appreciated; no murmurs, no clicks, no rubs, no gallops RESP: Normal chest excursion without splinting or tachypnea; breath sounds clear and equal bilaterally; no wheezes, no rhonchi, no rales; no hypoxia or respiratory distress CHEST:  chest wall stable, no crepitus or ecchymosis or deformity, nontender to palpation; no flail chest ABD/GI: Normal bowel sounds; non-distended; soft, non-tender, no rebound, no guarding; no ecchymosis or other lesions noted PELVIS:  stable, nontender to palpation BACK:  The back appears normal and is non-tender to palpation, there is no CVA tenderness; no midline spinal tenderness, step-off or deformity EXT: Normal ROM in all joints; non-tender to palpation; no edema; normal capillary refill; no cyanosis, no bony tenderness or bony deformity of patient's extremities, no joint effusion, compartments are soft, extremities are warm and well-perfused, no ecchymosis SKIN: Normal color for age and race; warm NEURO: Moves all extremities equally, sensation to light touch intact diffusely, strength 5/5 in all 4, cranial nerves II through XII intact, normal speech PSYCH: The patient's mood and manner are appropriate. Grooming and personal hygiene are appropriate.  MEDICAL DECISION MAKING: Patient here with a fall at home. Was found with decreased responsiveness, hypoxia,  nausea and vomiting. His blood glucose was normal with EMS. Here he has hemodynamic is stable, neurologically intact without any sign of trauma. Does have history of multiple myeloma and is on Velcade twice a month. No immunosuppressant. He has not had any recent infectious symptoms. Family does report he has previously had a history of alcohol abuse. Will obtain labs, urine, CT of his head and cervical spine, chest x-ray. We'll give IV fluids, nausea medication.  ED PROGRESS: Patient now admits to drinking alcohol tonight. 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 She states when she came home that he "reeked of alcohol" and that he was "talking silly". She thinks that he is intoxicated.  1:10 AM  Pt's urine shows no sign of infection or dehydration. He has been able to ambulate without difficulty, ataxic gait. Plan is to discharge patient home with his wife who is sober. Will give outpatient rehabilitation information. Patient does not  meet criteria at this time for hospitalization. No psychiatric safety concerns. Does not need TTS consult. Patient and wife are comfortable with this plan.  Urine drug screen is negative. Given thiamine, multivitamin.  At this time, I do not feel there is any life-threatening condition present. I have reviewed and discussed all results (EKG, imaging, lab, urine as appropriate) and exam findings with patient/family. I have reviewed nursing notes and appropriate previous records.  I feel the patient is safe to be discharged home without further emergent workup and can continue workup as an outpatient as needed. Discussed usual and customary return precautions. Patient/family verbalize understanding and are comfortable with this plan.  Outpatient follow-up has been provided if needed. All questions have been answered.    EKG Interpretation  Date/Time:  Tuesday January 03 2017 23:42:16 EDT Ventricular Rate:  61 PR Interval:  184 QRS  Duration: 152 QT Interval:  504 QTC Calculation: 507 R Axis:   -28 Text Interpretation:  Normal sinus rhythm Right bundle branch block Abnormal ECG No old tracing to compare Confirmed by Akita Maxim, Cyril Mourning [phone]) on 01/03/2017 11:45:23 PM        I personally performed the services described in this documentation, which was scribed in my presence. The recorded information has been reviewed and is accurate.     Teriana Danker, Delice Bison, DO 01/04/17 0130

## 2017-01-04 ENCOUNTER — Emergency Department (HOSPITAL_COMMUNITY): Payer: Medicare Other

## 2017-01-04 ENCOUNTER — Telehealth: Payer: Self-pay | Admitting: Family Medicine

## 2017-01-04 ENCOUNTER — Other Ambulatory Visit (HOSPITAL_COMMUNITY): Payer: Self-pay

## 2017-01-04 DIAGNOSIS — F10920 Alcohol use, unspecified with intoxication, uncomplicated: Secondary | ICD-10-CM | POA: Diagnosis not present

## 2017-01-04 DIAGNOSIS — S0990XA Unspecified injury of head, initial encounter: Secondary | ICD-10-CM | POA: Diagnosis not present

## 2017-01-04 DIAGNOSIS — S199XXA Unspecified injury of neck, initial encounter: Secondary | ICD-10-CM | POA: Diagnosis not present

## 2017-01-04 DIAGNOSIS — R51 Headache: Secondary | ICD-10-CM | POA: Diagnosis not present

## 2017-01-04 DIAGNOSIS — S299XXA Unspecified injury of thorax, initial encounter: Secondary | ICD-10-CM | POA: Diagnosis not present

## 2017-01-04 LAB — COMPREHENSIVE METABOLIC PANEL
ALBUMIN: 4.3 g/dL (ref 3.5–5.0)
ALK PHOS: 60 U/L (ref 38–126)
ALT: 20 U/L (ref 17–63)
AST: 26 U/L (ref 15–41)
Anion gap: 9 (ref 5–15)
BILIRUBIN TOTAL: 0.5 mg/dL (ref 0.3–1.2)
BUN: 15 mg/dL (ref 6–20)
CO2: 27 mmol/L (ref 22–32)
CREATININE: 0.78 mg/dL (ref 0.61–1.24)
Calcium: 9.4 mg/dL (ref 8.9–10.3)
Chloride: 102 mmol/L (ref 101–111)
GFR calc Af Amer: >60 mL/min (ref >60)
GLUCOSE: 110 mg/dL — AB (ref 65–99)
POTASSIUM: 3.8 mmol/L (ref 3.5–5.1)
Sodium: 138 mmol/L (ref 135–145)
TOTAL PROTEIN: 7.5 g/dL (ref 6.5–8.1)

## 2017-01-04 LAB — URINALYSIS, ROUTINE W REFLEX MICROSCOPIC
BILIRUBIN URINE: NEGATIVE
GLUCOSE, UA: NEGATIVE mg/dL
HGB URINE DIPSTICK: NEGATIVE
Ketones, ur: NEGATIVE mg/dL
Leukocytes, UA: NEGATIVE
Nitrite: NEGATIVE
PROTEIN: NEGATIVE mg/dL
SPECIFIC GRAVITY, URINE: 1.004 — AB (ref 1.005–1.030)
pH: 6 (ref 5.0–8.0)

## 2017-01-04 LAB — RAPID URINE DRUG SCREEN, HOSP PERFORMED
AMPHETAMINES: NOT DETECTED
BARBITURATES: NOT DETECTED
BENZODIAZEPINES: NOT DETECTED
Cocaine: NOT DETECTED
Opiates: NOT DETECTED
TETRAHYDROCANNABINOL: NOT DETECTED

## 2017-01-04 LAB — ETHANOL: Alcohol, Ethyl (B): 152 mg/dL — ABNORMAL HIGH (ref <5)

## 2017-01-04 LAB — CBC WITH DIFFERENTIAL/PLATELET
BASOS ABS: 0 10*3/uL (ref 0.0–0.1)
BASOS PCT: 0 %
Eosinophils Absolute: 0 10*3/uL (ref 0.0–0.7)
Eosinophils Relative: 0 %
HEMATOCRIT: 40.1 % (ref 39.0–52.0)
HEMOGLOBIN: 13.5 g/dL (ref 13.0–17.0)
LYMPHS PCT: 11 %
Lymphs Abs: 0.7 10*3/uL (ref 0.7–4.0)
MCH: 30.3 pg (ref 26.0–34.0)
MCHC: 33.7 g/dL (ref 30.0–36.0)
MCV: 89.9 fL (ref 78.0–100.0)
MONO ABS: 0.4 10*3/uL (ref 0.1–1.0)
Monocytes Relative: 6 %
NEUTROS ABS: 5.6 10*3/uL (ref 1.7–7.7)
Neutrophils Relative %: 83 %
Platelets: 184 10*3/uL (ref 150–400)
RBC: 4.46 MIL/uL (ref 4.22–5.81)
RDW: 15 % (ref 11.5–15.5)
WBC: 6.8 10*3/uL (ref 4.0–10.5)

## 2017-01-04 LAB — CK: CK TOTAL: 220 U/L (ref 49–397)

## 2017-01-04 MED ORDER — VITAMIN B-1 100 MG PO TABS: 100.0000 mg | ORAL_TABLET | Freq: Every day | ORAL | 1 refills | Status: DC

## 2017-01-04 MED ORDER — VITAMIN B-1 100 MG PO TABS
100.0000 mg | ORAL_TABLET | Freq: Once | ORAL | Status: AC
  Administered 2017-01-04: 100 mg via ORAL

## 2017-01-04 MED ORDER — VITAMIN B-1 100 MG PO TABS
ORAL_TABLET | ORAL | Status: AC
  Filled 2017-01-04: qty 1

## 2017-01-04 MED ORDER — ADULT MULTIVITAMIN W/MINERALS CH
1.0000 | ORAL_TABLET | Freq: Once | ORAL | Status: AC
  Administered 2017-01-04: 1 via ORAL

## 2017-01-04 MED ORDER — ADULT MULTIVITAMIN W/MINERALS CH
ORAL_TABLET | ORAL | Status: AC
  Filled 2017-01-04: qty 1

## 2017-01-04 MED ORDER — ONE-A-DAY MENS PO TABS: 1.0000 | ORAL_TABLET | Freq: Every day | ORAL | 1 refills | Status: DC

## 2017-01-04 MED ORDER — ONDANSETRON 4 MG PO TBDP: 4.0000 mg | ORAL_TABLET | Freq: Three times a day (TID) | ORAL | 0 refills | Status: DC | PRN

## 2017-01-04 NOTE — Telephone Encounter (Signed)
He just went to ED yesterday and they could only give them a list of inpatient programs.  I think she was just trying to figure out what you thought was the best or better place to go or if you had any past patient experience w/ any of these places?

## 2017-01-04 NOTE — ED Notes (Signed)
PT states understanding of care given, follow up care, and medication prescribed. PT ambulated from ED to car with a steady gait. 

## 2017-01-04 NOTE — Telephone Encounter (Signed)
[facility] is the name of the other facility that Dr Anitra Lauth recommends.

## 2017-01-04 NOTE — ED Notes (Signed)
Pt able to ambulate independently, pt slightly unsteady gait physician notified. Pt also stated that he ambulates this way always

## 2017-01-04 NOTE — Telephone Encounter (Signed)
Needs to go to any ED ([facility] or [facility] would be good) so he can get "medical clearance"--this must be done at the ED, not my office--- and they will help facilitate or give a list of inpatient rehab programs that they can look into.-thx

## 2017-01-04 NOTE — Telephone Encounter (Signed)
Patient's wife was made aware of all places recommended.  Unfortunately, none of them accept Medicare and patient's wife is having trouble but she did thank [place] both for all our help.

## 2017-01-04 NOTE — Telephone Encounter (Signed)
Spoke w/ Dr Anitra Lauth, he advised me that [facility] is a good option.  Also, [facility] is in [place] but he's not heard of any patient experiences good or bad and there is one more that he could not think of at the time, he is going to get back w/ me today when he remembers.    I advised patient's wife of information above and will contact her as soon as Dr remembers the other facility.

## 2017-01-04 NOTE — Telephone Encounter (Signed)
Patient's wife called stating patient was drunk and passed out on the floor when she came home yesterday and wife thought he was dead but he was actually only drunk and was advised to ED doctor to go to inpatient treatment center.  She is just lost at where to take him because they are so new to area.  Any recommendations?

## 2017-01-05 NOTE — Telephone Encounter (Signed)
Noted  

## 2017-01-09 DIAGNOSIS — Z79899 Other long term (current) drug therapy: Secondary | ICD-10-CM | POA: Diagnosis not present

## 2017-01-11 DIAGNOSIS — Z8546 Personal history of malignant neoplasm of prostate: Secondary | ICD-10-CM | POA: Diagnosis not present

## 2017-01-11 DIAGNOSIS — Z923 Personal history of irradiation: Secondary | ICD-10-CM | POA: Diagnosis not present

## 2017-01-11 DIAGNOSIS — C9001 Multiple myeloma in remission: Secondary | ICD-10-CM | POA: Diagnosis not present

## 2017-01-11 DIAGNOSIS — R937 Abnormal findings on diagnostic imaging of other parts of musculoskeletal system: Secondary | ICD-10-CM | POA: Diagnosis not present

## 2017-01-11 DIAGNOSIS — M2031 Hallux varus (acquired), right foot: Secondary | ICD-10-CM | POA: Diagnosis not present

## 2017-01-11 DIAGNOSIS — M19071 Primary osteoarthritis, right ankle and foot: Secondary | ICD-10-CM | POA: Diagnosis not present

## 2017-01-11 DIAGNOSIS — Z5112 Encounter for antineoplastic immunotherapy: Secondary | ICD-10-CM | POA: Diagnosis not present

## 2017-01-11 DIAGNOSIS — C9000 Multiple myeloma not having achieved remission: Secondary | ICD-10-CM | POA: Diagnosis not present

## 2017-01-20 ENCOUNTER — Telehealth: Payer: Self-pay | Admitting: Family Medicine

## 2017-01-20 NOTE — Telephone Encounter (Signed)
Requesting referral to eye doctor due to crust in eye that patient wakes up with every morning.  This has been going on for approximately the last 2 months.  Patient denies pain or vision changes.  Also requesting referral to see a podiatrist for painful ingrown toenail.

## 2017-01-20 NOTE — Telephone Encounter (Signed)
Noted. Agree.

## 2017-01-20 NOTE — Telephone Encounter (Signed)
SW pts wife (okay per DPR). I advised her that Dr. Anitra Lauth can see pt for these issues. Apt for eyes has been made for 01/23/17 at 10:15am (36min) and apt for toenail has been made for 01/26/17 3:30pm (20min).

## 2017-01-23 ENCOUNTER — Ambulatory Visit (INDEPENDENT_AMBULATORY_CARE_PROVIDER_SITE_OTHER): Payer: Medicare Other | Admitting: Family Medicine

## 2017-01-23 ENCOUNTER — Encounter: Payer: Self-pay | Admitting: Family Medicine

## 2017-01-23 VITALS — BP 146/78 | HR 58 | Temp 99.0°F | Resp 16 | Ht 69.5 in | Wt 242.2 lb

## 2017-01-23 DIAGNOSIS — H01004 Unspecified blepharitis left upper eyelid: Secondary | ICD-10-CM | POA: Diagnosis not present

## 2017-01-23 DIAGNOSIS — H01001 Unspecified blepharitis right upper eyelid: Secondary | ICD-10-CM

## 2017-01-23 DIAGNOSIS — H01005 Unspecified blepharitis left lower eyelid: Secondary | ICD-10-CM | POA: Diagnosis not present

## 2017-01-23 DIAGNOSIS — H01002 Unspecified blepharitis right lower eyelid: Secondary | ICD-10-CM

## 2017-01-23 DIAGNOSIS — H1013 Acute atopic conjunctivitis, bilateral: Secondary | ICD-10-CM

## 2017-01-23 DIAGNOSIS — M79675 Pain in left toe(s): Secondary | ICD-10-CM | POA: Diagnosis not present

## 2017-01-23 DIAGNOSIS — H0100B Unspecified blepharitis left eye, upper and lower eyelids: Principal | ICD-10-CM

## 2017-01-23 DIAGNOSIS — H0100A Unspecified blepharitis right eye, upper and lower eyelids: Secondary | ICD-10-CM

## 2017-01-23 MED ORDER — ERYTHROMYCIN 5 MG/GM OP OINT: TOPICAL_OINTMENT | OPHTHALMIC | 3 refills | Status: DC

## 2017-01-23 NOTE — Progress Notes (Signed)
OFFICE VISIT  01/23/2017   CC:  Chief Complaint  Patient presents with  . Eye Problem    red and crusty in the morning   HPI:    Patient is a 76 y.o.  male who presents accompanied by his wife today for "crust in eyes". Onset a couple years ago, crusty and some redness in mornings in eye lids/lash lining.  Eyes burn some on/off.  He notes some crustiness some later in the day as well.  He is exposed to outside in many ways--mows lawn/tractor, drives a convertible.  He does use baby shampoo with warm water most days after having an MD tell him to do this in the past.  This helps temporarily.  Also, L great toe painful/ingrown x last 5-6 mo.  Hurts signif to touch or rubs against something.  Points to medial corner of L great toe. No redness or swelling or exudate.  Past Medical History:  Diagnosis Date  . Bone lesion   . Depression   . Family history of colon cancer in mother   . Hyperlipidemia   . Hypertension   . Insomnia    uses quetiapine for this  . Multiple myeloma ([place])    [facility]  . Nephrolithiasis   . Osteoarthritis    C-spine--hx of fusion surgery.  R foot. Knees (hx of L TKA)  . Prostate cancer [facility])    [facility]    Past Surgical History:  Procedure Laterality Date  . ANTERIOR FUSION CERVICAL SPINE  2002  . autologous stem cell transplant  2010 and 2012   [facility] in [place].  Marland Kitchen COLONOSCOPY  2015   Recall 5 yrs ([facility] in [place])  . EXCISIONAL TOTAL KNEE ARTHROPLASTY  2014 and 2016   Both knees  . LAPAROSCOPIC RETROPUBIC PROSTATECTOMY     + hx of radiation to pelvis.    Outpatient Medications Prior to Visit  Medication Sig Dispense Refill  . acetaminophen (TYLENOL) 325 MG tablet Take 650 mg by mouth every 4 (four) hours as needed.     Marland Kitchen acyclovir (ZOVIRAX) 400 MG tablet Take 400 mg by mouth daily.     Marland Kitchen amLODipine (NORVASC) 10 MG tablet Take 10 mg by mouth daily.  0  . ammonium lactate (AMLACTIN) 12 %  cream Apply at least once daily after bathing and patting dry.  99  . aspirin EC 81 MG tablet Take by mouth.    . bortezomib IV (VELCADE) 3.5 MG injection Inject into the skin.    . Fluocinolone Acetonide Body 0.01 % OIL Apply daily to affected areas.  Stop when smooth.    . fluorouracil (EFUDEX) 5 % cream Apply once daily to scalp for two weeks  2  . FLUoxetine (PROZAC) 20 MG capsule Take 20 mg by mouth daily.  0  . multivitamin (ONE-A-DAY MEN'S) TABS tablet Take 1 tablet by mouth daily. 30 tablet 1  . ondansetron (ZOFRAN ODT) 4 MG disintegrating tablet Take 1 tablet (4 mg total) by mouth every 8 (eight) hours as needed for nausea or vomiting. 20 tablet 0  . ondansetron (ZOFRAN) 8 MG tablet TAKE 1 TABLET (8 MG TOTAL) BY MOUTH EVERY 8 (EIGHT) HOURS AS NEEDED FOR NAUSEA FOR UP TO 30 DOSES.  3  . QUEtiapine (SEROQUEL) 100 MG tablet Take 1 tablet (100 mg total) by mouth at bedtime. 90 tablet 3  . thiamine (VITAMIN B-1) 100 MG tablet Take 1 tablet (100 mg total) by mouth  daily. 30 tablet 1  . zolendronic acid (ZOMETA) 4 MG/5ML injection Inject into the vein.    Marland Kitchen zolpidem (AMBIEN) 10 MG tablet Take by mouth.    . erythromycin ophthalmic ointment apply to incisions twice daily for 10 days  0   No facility-administered medications prior to visit.     No Known Allergies  ROS As per HPI  PE: Blood pressure (!) 146/78, pulse (!) 58, temperature 99 F (37.2 C), temperature source Oral, resp. rate 16, height 5' 9.5" (1.765 m), weight 242 lb 4 oz (109.9 kg), SpO2 97 %. Gen: Alert, well appearing.  Patient is oriented to person, place, time, and situation. AFFECT: pleasant, lucid thought and speech. Eyes: no eyelid swelling, no crusty exudate noted, eye lashes appear normal, bulbar and palpebral conjunctiva without erythema or swelling.  EOMI, PERRL. Left foot, great toe: no erythema or swelling.  Toenail w/out thickening.  It is trimmed deep into the corners bilat.  Mild/mod TTP over medial aspect  of nail fold, with mild calloused skin in the area.  No area of fluctuance or lucency to the skin.    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 Plan: continue baby shampoo+ warm water soaks daily.  Take otc zaditor drops, 2 drops each eye bid. Take e-mycin ointment both eyes qid x 5d prn eyes significantly more irritated for several days ---tx possible recurrent staph in the area of glands near eyelashes.  2) Left great toe pain: possible chronic in-grown toenail.  No sign of acute inflammation currently, but pt symptomatic. Encouraged pt to soak in warm epsom salt bath for 20 min bid.  Keep appt scheduled with me in 2d and if not improved any then will remove medial 1/2 of nail--as per pt preference.  An After Visit Summary was printed and given to the patient.  FOLLOW UP: Return for keep appt scheduled in 2 d.  At the end of the appt today, wife brought up the issue of pt's persistent irritability/poor mood---I encouraged pt to make separate appt in the future to discuss this in depth and come up with a treatment plan.  Signed:  Crissie Sickles, MD           01/23/2017

## 2017-01-23 NOTE — Patient Instructions (Signed)
Eyes: apply over the counter, generic Zaditor drops, 2 drops each eye twice daily for itching/irritation/redness.  Foot/toe:soak in warm epsom salt bath x 20 min twice daily.

## 2017-01-24 DIAGNOSIS — Z79899 Other long term (current) drug therapy: Secondary | ICD-10-CM | POA: Diagnosis not present

## 2017-01-25 DIAGNOSIS — C9000 Multiple myeloma not having achieved remission: Secondary | ICD-10-CM | POA: Diagnosis not present

## 2017-01-26 ENCOUNTER — Encounter: Payer: Self-pay | Admitting: Family Medicine

## 2017-01-26 ENCOUNTER — Ambulatory Visit (INDEPENDENT_AMBULATORY_CARE_PROVIDER_SITE_OTHER): Payer: Medicare Other | Admitting: Family Medicine

## 2017-01-26 VITALS — BP 123/71 | HR 71 | Temp 97.3°F | Resp 16 | Wt 242.0 lb

## 2017-01-26 DIAGNOSIS — H01001 Unspecified blepharitis right upper eyelid: Secondary | ICD-10-CM

## 2017-01-26 DIAGNOSIS — H01004 Unspecified blepharitis left upper eyelid: Secondary | ICD-10-CM | POA: Diagnosis not present

## 2017-01-26 DIAGNOSIS — H01002 Unspecified blepharitis right lower eyelid: Secondary | ICD-10-CM

## 2017-01-26 DIAGNOSIS — H0100A Unspecified blepharitis right eye, upper and lower eyelids: Secondary | ICD-10-CM

## 2017-01-26 DIAGNOSIS — L600 Ingrowing nail: Secondary | ICD-10-CM | POA: Diagnosis not present

## 2017-01-26 DIAGNOSIS — H01005 Unspecified blepharitis left lower eyelid: Secondary | ICD-10-CM | POA: Diagnosis not present

## 2017-01-26 DIAGNOSIS — H0100B Unspecified blepharitis left eye, upper and lower eyelids: Secondary | ICD-10-CM

## 2017-01-26 MED ORDER — FLUOXETINE HCL 20 MG PO CAPS: 20.0000 mg | ORAL_CAPSULE | Freq: Every day | ORAL | 6 refills | Status: DC

## 2017-01-26 NOTE — Progress Notes (Signed)
OFFICE VISIT  01/26/2017   CC:  Chief Complaint  Patient presents with  . Ingrown Toenail    Left great toe     HPI:    Patient is a 76 y.o. Caucasian male who presents for painful left great toe, mild chronic ingrowing noted. Three days ago I saw him in office and recommended epsom salt soaks bid.  The toenail looked minimally ingrown, was mildly TTP at the ingrown region but had no sign of inflammation or infection.  He has noted significant improvement with just the 2 days of soaking and we decided to continue with this approach and NOT take remove any part of his nail today.  Additionally, his blapharitis has been responding well to the e-mycin ointment I rx'd him at last visit.  However, he lost the tube b/c it is so small, asks if we can authorize an early RF.  Past Medical History:  Diagnosis Date  . Bone lesion   . Depression   . Family history of colon cancer in mother   . Hyperlipidemia   . Hypertension   . Insomnia    uses quetiapine for this  . Multiple myeloma ([place])    [facility]  . Nephrolithiasis   . Osteoarthritis    C-spine--hx of fusion surgery.  R foot. Knees (hx of L TKA)  . Prostate cancer [facility])    [facility]    Past Surgical History:  Procedure Laterality Date  . ANTERIOR FUSION CERVICAL SPINE  2002  . autologous stem cell transplant  2010 and 2012   [facility] in [place].  Marland Kitchen COLONOSCOPY  2015   Recall 5 yrs ([facility] in [place], [place])  . EXCISIONAL TOTAL KNEE ARTHROPLASTY  2014 and 2016   Both knees  . LAPAROSCOPIC RETROPUBIC PROSTATECTOMY     + hx of radiation to pelvis.    Outpatient Medications Prior to Visit  Medication Sig Dispense Refill  . acetaminophen (TYLENOL) 325 MG tablet Take 650 mg by mouth every 4 (four) hours as needed.     Marland Kitchen acyclovir (ZOVIRAX) 400 MG tablet Take 400 mg by mouth daily.     Marland Kitchen amLODipine (NORVASC) 10 MG tablet Take 10 mg by mouth daily.  0  . ammonium lactate (AMLACTIN)  12 % cream Apply at least once daily after bathing and patting dry.  99  . aspirin EC 81 MG tablet Take by mouth.    . bortezomib IV (VELCADE) 3.5 MG injection Inject into the skin.    Marland Kitchen erythromycin ophthalmic ointment Apply to lashes area of both eyelids qid x 5 days as needed when eyes are significantly more irritated than usual 3.5 g 3  . Fluocinolone Acetonide Body 0.01 % OIL Apply daily to affected areas.  Stop when smooth.    . fluorouracil (EFUDEX) 5 % cream Apply once daily to scalp for two weeks  2  . multivitamin (ONE-A-DAY MEN'S) TABS tablet Take 1 tablet by mouth daily. 30 tablet 1  . ondansetron (ZOFRAN ODT) 4 MG disintegrating tablet Take 1 tablet (4 mg total) by mouth every 8 (eight) hours as needed for nausea or vomiting. 20 tablet 0  . ondansetron (ZOFRAN) 8 MG tablet TAKE 1 TABLET (8 MG TOTAL) BY MOUTH EVERY 8 (EIGHT) HOURS AS NEEDED FOR NAUSEA FOR UP TO 30 DOSES.  3  . QUEtiapine (SEROQUEL) 100 MG tablet Take 1 tablet (100 mg total) by mouth at bedtime. 90 tablet 3  . thiamine (VITAMIN B-1) 100  MG tablet Take 1 tablet (100 mg total) by mouth daily. 30 tablet 1  . zolendronic acid (ZOMETA) 4 MG/5ML injection Inject into the vein.    Marland Kitchen zolpidem (AMBIEN) 10 MG tablet Take by mouth.    Marland Kitchen FLUoxetine (PROZAC) 20 MG capsule Take 20 mg by mouth daily.  0   No facility-administered medications prior to visit.     No Known Allergies  ROS As per HPI  PE: Blood pressure 123/71, pulse 71, temperature (!) 97.3 F (36.3 C), temperature source Oral, resp. rate 16, weight 242 lb (109.8 kg), SpO2 98 %. Gen: Alert, well appearing.  Patient is oriented to person, place, time, and situation. AFFECT: pleasant, lucid thought and speech. L great toe with no erythema, swelling, or exudate. Nail is mildly ingrowing in medial and lateral aspects.  Minimal discomfort to palpation of medial nail border.  LABS:  none  IMPRESSION AND PLAN:  1) Left great toenail pain, very mild ingrowing  toenail.   Continue conservative mgmt with 20 min bid warm epsom salt soaks until toe feels normal. Restart soaking if pain returns.  Instructions on correctly cutting toenail given today. Signs/symptoms to call or return for were reviewed and pt expressed understanding.  2) Blepharitis, improving: continue current mgmt---authorized early RF of e-mycin ointment that he can use episodically for "flares".    An After Visit Summary was printed and given to the patient.  FOLLOW UP: Return for keep appt planned for 04/2017.  Signed:  Crissie Sickles, MD           01/26/2017

## 2017-01-27 DIAGNOSIS — N5231 Erectile dysfunction following radical prostatectomy: Secondary | ICD-10-CM | POA: Diagnosis not present

## 2017-01-31 ENCOUNTER — Other Ambulatory Visit: Payer: Self-pay | Admitting: Family Medicine

## 2017-01-31 DIAGNOSIS — Z79899 Other long term (current) drug therapy: Secondary | ICD-10-CM | POA: Diagnosis not present

## 2017-01-31 MED ORDER — ACYCLOVIR 400 MG PO TABS: 400.0000 mg | ORAL_TABLET | Freq: Every day | ORAL | 3 refills | Status: DC

## 2017-01-31 NOTE — Telephone Encounter (Signed)
Patient requesting rf of acyclovir,  It was originally given to patient from Lorenza Evangelist, [place] - last PCP.    Please send RX to [facility].

## 2017-02-08 DIAGNOSIS — C9000 Multiple myeloma not having achieved remission: Secondary | ICD-10-CM | POA: Diagnosis not present

## 2017-02-11 DIAGNOSIS — Z79899 Other long term (current) drug therapy: Secondary | ICD-10-CM | POA: Diagnosis not present

## 2017-02-20 ENCOUNTER — Encounter: Payer: Self-pay | Admitting: Family Medicine

## 2017-02-20 ENCOUNTER — Ambulatory Visit (INDEPENDENT_AMBULATORY_CARE_PROVIDER_SITE_OTHER): Payer: Medicare Other | Admitting: Family Medicine

## 2017-02-20 VITALS — BP 158/73 | HR 64 | Temp 99.0°F | Resp 16 | Ht 69.5 in | Wt 247.5 lb

## 2017-02-20 DIAGNOSIS — B9789 Other viral agents as the cause of diseases classified elsewhere: Secondary | ICD-10-CM

## 2017-02-20 DIAGNOSIS — J069 Acute upper respiratory infection, unspecified: Secondary | ICD-10-CM | POA: Diagnosis not present

## 2017-02-20 MED ORDER — AMOXICILLIN 875 MG PO TABS: 875.0000 mg | ORAL_TABLET | Freq: Two times a day (BID) | ORAL | 0 refills | Status: AC

## 2017-02-20 NOTE — Progress Notes (Addendum)
OFFICE VISIT  02/20/2017   CC:  Chief Complaint  Patient presents with  . URI    HPI:    Patient is a 75 y.o.  male who presents for respiratory symptoms. Onset 3 d/a of scratchy throat, runny nose, PND, + cough.  No fever, no SOB. Eating and drinking fine.  Took mucinex last night.  No rash or HA.  Tired but no body aches. He is currently (and has been long term) on Velcade chemo for multiple myeloma via [facility]--immunosuppressed from this med and from his dz.  Pt reports his CBCs are always "good" when he goes q 2 weeks for treatments.  Past Medical History:  Diagnosis Date  . Bone lesion   . Depression   . Family history of colon cancer in mother   . Hyperlipidemia   . Hypertension   . Insomnia    uses quetiapine for this  . Multiple myeloma ([place])    [facility]  . Nephrolithiasis   . Osteoarthritis    C-spine--hx of fusion surgery.  R foot. Knees (hx of L TKA)  . Prostate cancer [facility])    [facility]    Past Surgical History:  Procedure Laterality Date  . ANTERIOR FUSION CERVICAL SPINE  2002  . autologous stem cell transplant  2010 and 2012   [facility] in [place].  Marland Kitchen COLONOSCOPY  2015   Recall 5 yrs ([facility] in [place], [place])  . EXCISIONAL TOTAL KNEE ARTHROPLASTY  2014 and 2016   Both knees  . LAPAROSCOPIC RETROPUBIC PROSTATECTOMY     + hx of radiation to pelvis.    Outpatient Medications Prior to Visit  Medication Sig Dispense Refill  . acetaminophen (TYLENOL) 325 MG tablet Take 650 mg by mouth every 4 (four) hours as needed.     Marland Kitchen acyclovir (ZOVIRAX) 400 MG tablet Take 1 tablet (400 mg total) by mouth daily. 90 tablet 3  . amLODipine (NORVASC) 10 MG tablet Take 10 mg by mouth daily.  0  . ammonium lactate (AMLACTIN) 12 % cream Apply at least once daily after bathing and patting dry.  99  . aspirin EC 81 MG tablet Take by mouth.    . bortezomib IV (VELCADE) 3.5 MG injection Inject into the skin.    Marland Kitchen erythromycin ophthalmic  ointment Apply to lashes area of both eyelids qid x 5 days as needed when eyes are significantly more irritated than usual 3.5 g 3  . Fluocinolone Acetonide Body 0.01 % OIL Apply daily to affected areas.  Stop when smooth.    . fluorouracil (EFUDEX) 5 % cream Apply once daily to scalp for two weeks  2  . FLUoxetine (PROZAC) 20 MG capsule Take 1 capsule (20 mg total) by mouth daily. 30 capsule 6  . multivitamin (ONE-A-DAY MEN'S) TABS tablet Take 1 tablet by mouth daily. 30 tablet 1  . ondansetron (ZOFRAN ODT) 4 MG disintegrating tablet Take 1 tablet (4 mg total) by mouth every 8 (eight) hours as needed for nausea or vomiting. 20 tablet 0  . ondansetron (ZOFRAN) 8 MG tablet TAKE 1 TABLET (8 MG TOTAL) BY MOUTH EVERY 8 (EIGHT) HOURS AS NEEDED FOR NAUSEA FOR UP TO 30 DOSES.  3  . QUEtiapine (SEROQUEL) 100 MG tablet Take 1 tablet (100 mg total) by mouth at bedtime. 90 tablet 3  . thiamine (VITAMIN B-1) 100 MG tablet Take 1 tablet (100 mg total) by mouth daily. 30 tablet 1  . zolendronic acid (ZOMETA) 4  MG/5ML injection Inject into the vein.    Marland Kitchen zolpidem (AMBIEN) 10 MG tablet Take by mouth.     No facility-administered medications prior to visit.     No Known Allergies  ROS As per HPI  PE: Blood pressure (!) 158/73, pulse 64, temperature 99 F (37.2 C), temperature source Oral, resp. rate 16, height 5' 9.5" (1.765 m), weight 247 lb 8 oz (112.3 kg), SpO2 96 %. VS: noted--normal. Gen: alert, NAD, NONTOXIC APPEARING. HEENT: eyes without injection, drainage, or swelling.  Ears: EACs clear, TMs with normal light reflex and landmarks.  Nose: Clear rhinorrhea, with some dried, crusty exudate adherent to mildly injected mucosa.  No purulent d/c.  No paranasal sinus TTP.  No facial swelling.  Throat and mouth without focal lesion.  No pharyngial swelling, erythema, or exudate.   Neck: supple, no LAD.   LUNGS: CTA bilat, nonlabored resps.   CV: RRR, 1/6 systolic murmur at cardiac base.  No diastolic  murmur.  No r/g. EXT: no c/c/e SKIN: no rash   LABS:  Lab Results  Component Value Date   WBC 6.8 01/03/2017   HGB 13.5 01/03/2017   HCT 40.1 01/03/2017   MCV 89.9 01/03/2017   PLT 184 01/03/2017     Chemistry      Component Value Date/Time   NA 138 01/03/2017 2323   K 3.8 01/03/2017 2323   CL 102 01/03/2017 2323   CO2 27 01/03/2017 2323   BUN 15 01/03/2017 2323   CREATININE 0.78 01/03/2017 2323      Component Value Date/Time   CALCIUM 9.4 01/03/2017 2323   ALKPHOS 60 01/03/2017 2323   AST 26 01/03/2017 2323   ALT 20 01/03/2017 2323   BILITOT 0.5 01/03/2017 2323     IMPRESSION AND PLAN:  Viral URI with cough, immunosuppressed pt. No new rx at this time.  Encouraged pt to give this a few more days to start improving. If he starts getting fevers OR if no improvements in sx's in a few days, then he can fill and start taking the amoxil 841m bid x 10d that I sent in today.   Signs/symptoms to call or return for were reviewed and pt expressed understanding.  An After Visit Summary was printed and given to the patient.  FOLLOW UP: Return if symptoms worsen or fail to improve.  Signed:  PCrissie Sickles MD           02/20/2017

## 2017-02-21 DIAGNOSIS — Z79899 Other long term (current) drug therapy: Secondary | ICD-10-CM | POA: Diagnosis not present

## 2017-02-22 DIAGNOSIS — C9000 Multiple myeloma not having achieved remission: Secondary | ICD-10-CM | POA: Diagnosis not present

## 2017-02-23 ENCOUNTER — Encounter: Payer: Self-pay | Admitting: Family Medicine

## 2017-02-23 ENCOUNTER — Ambulatory Visit (INDEPENDENT_AMBULATORY_CARE_PROVIDER_SITE_OTHER): Payer: Medicare Other | Admitting: Family Medicine

## 2017-02-23 VITALS — BP 147/69 | HR 62 | Temp 98.4°F | Resp 16 | Ht 69.5 in | Wt 248.0 lb

## 2017-02-23 DIAGNOSIS — F1021 Alcohol dependence, in remission: Secondary | ICD-10-CM

## 2017-02-23 DIAGNOSIS — F331 Major depressive disorder, recurrent, moderate: Secondary | ICD-10-CM

## 2017-02-23 MED ORDER — FLUOXETINE HCL 40 MG PO CAPS: 40.0000 mg | ORAL_CAPSULE | Freq: Every day | ORAL | 1 refills | Status: DC

## 2017-02-23 NOTE — Progress Notes (Signed)
OFFICE VISIT  02/23/2017   CC:  Chief Complaint  Patient presents with  . Follow-up    depression, discuss changing medication     HPI:    Patient is a 76 y.o.  male who presents accompanied by his wife for depression follow up. Of note, he is feeling better from a resp standpoint, ended up filling the amoxil and says it is helping a lot.Marland Kitchen  Hx of low vit D level when checked at [place] recently, was put on 50K q week replacement dosing.  Wife talked the entire first 10-15 min of o/v, after pt said "I'm not depressed, but she thinks I am". Wife describes pt as being irritable, easily angered, says he feels like no one likes him.  She says he doesn't shower or care how he looks or smells.  When wife finally asks him to take a shower after a week or so, he says she is a nag. Pt frustrated with loss of hearing.  Denies feeling a sad/down mood.  Says he is a happy person. He disagrees that he is irritable.  He says he does not snap at her easily.  Says he sleeps "great" at night. Appetite is "fine".  Says concentration is fine.   Wife says "he has started drinking again".  Wife says he never admits to drinking.    Wife says couples counseling "won't work".  He apparently says everything the counselor wants to hear and they end up stopping therapy.  Pt states he has not had a drink in 6 weeks.  He admits to hx of alcoholism and quit drinking 15 yrs ago, admits to relapse 6 wks ago.  [facility] for [facility] with this.  Past Medical History:  Diagnosis Date  . Bone lesion   . Depression   . Family history of colon cancer in mother   . Hyperlipidemia   . Hypertension   . Insomnia    uses quetiapine for this  . Multiple myeloma ([place])    [facility]  . Nephrolithiasis   . Osteoarthritis    C-spine--hx of fusion surgery.  R foot. Knees (hx of L TKA)  . Prostate cancer [facility])    [facility]    Past Surgical History:  Procedure  Laterality Date  . ANTERIOR FUSION CERVICAL SPINE  2002  . autologous stem cell transplant  2010 and 2012   [facility] in [place].  Marland Kitchen COLONOSCOPY  2015   Recall 5 yrs ([facility] in [place], [place])  . EXCISIONAL TOTAL KNEE ARTHROPLASTY  2014 and 2016   Both knees  . LAPAROSCOPIC RETROPUBIC PROSTATECTOMY     + hx of radiation to pelvis.    Outpatient Medications Prior to Visit  Medication Sig Dispense Refill  . acetaminophen (TYLENOL) 325 MG tablet Take 650 mg by mouth every 4 (four) hours as needed.     Marland Kitchen acyclovir (ZOVIRAX) 400 MG tablet Take 1 tablet (400 mg total) by mouth daily. 90 tablet 3  . amLODipine (NORVASC) 10 MG tablet Take 10 mg by mouth daily.  0  . ammonium lactate (AMLACTIN) 12 % cream Apply at least once daily after bathing and patting dry.  99  . amoxicillin (AMOXIL) 875 MG tablet Take 1 tablet (875 mg total) by mouth 2 (two) times daily. 20 tablet 0  . aspirin EC 81 MG tablet Take by mouth.    . bortezomib IV (VELCADE) 3.5 MG injection Inject into the  skin.    . erythromycin ophthalmic ointment Apply to lashes area of both eyelids qid x 5 days as needed when eyes are significantly more irritated than usual 3.5 g 3  . Fluocinolone Acetonide Body 0.01 % OIL Apply daily to affected areas.  Stop when smooth.    . fluorouracil (EFUDEX) 5 % cream Apply once daily to scalp for two weeks  2  . multivitamin (ONE-A-DAY MEN'S) TABS tablet Take 1 tablet by mouth daily. 30 tablet 1  . ondansetron (ZOFRAN ODT) 4 MG disintegrating tablet Take 1 tablet (4 mg total) by mouth every 8 (eight) hours as needed for nausea or vomiting. 20 tablet 0  . ondansetron (ZOFRAN) 8 MG tablet TAKE 1 TABLET (8 MG TOTAL) BY MOUTH EVERY 8 (EIGHT) HOURS AS NEEDED FOR NAUSEA FOR UP TO 30 DOSES.  3  . QUEtiapine (SEROQUEL) 100 MG tablet Take 1 tablet (100 mg total) by mouth at bedtime. 90 tablet 3  . thiamine (VITAMIN B-1) 100 MG tablet Take 1 tablet (100 mg total) by mouth daily. 30 tablet 1  .  zolendronic acid (ZOMETA) 4 MG/5ML injection Inject into the vein.    Marland Kitchen zolpidem (AMBIEN) 10 MG tablet Take by mouth.    Marland Kitchen FLUoxetine (PROZAC) 20 MG capsule Take 1 capsule (20 mg total) by mouth daily. 30 capsule 6   No facility-administered medications prior to visit.     No Known Allergies  ROS As per HPI  PE: Blood pressure (!) 147/69, pulse 62, temperature 98.4 F (36.9 C), temperature source Oral, resp. rate 16, height 5' 9.5" (1.765 m), weight 248 lb (112.5 kg), SpO2 96 %. Gen: Alert, well appearing.  Patient is oriented to person, place, time, and situation. AFFECT: pleasant, lucid thought and speech. No further exam today.  LABS:    Chemistry      Component Value Date/Time   NA 138 01/03/2017 2323   K 3.8 01/03/2017 2323   CL 102 01/03/2017 2323   CO2 27 01/03/2017 2323   BUN 15 01/03/2017 2323   CREATININE 0.78 01/03/2017 2323      Component Value Date/Time   CALCIUM 9.4 01/03/2017 2323   ALKPHOS 60 01/03/2017 2323   AST 26 01/03/2017 2323   ALT 20 01/03/2017 2323   BILITOT 0.5 01/03/2017 2323      IMPRESSION AND PLAN:  1) Marital discord; wife insists pt is depressed.  He says he is open to increase of his fluoxetine, but says this in a way that makes it seem like he is doing only to pacify his wife and to be compliant with any recommendation I make. His wife insists that counseling won't help. I did encourage her to seek counseling for help in coping with this situation.  I recommended a trial of separation before resorting to divorce.  In fact, I emphasized to them that I did not endorse or recommend divorce at all, but a time of separation may do them both good.  He says nothing to this.  His wife simply says "he'll start drinking again". In the end, we decided to do a trial of increasing fluoxetine to 48m qd.  Therapeutic expectations and side effect profile of medication discussed today.  Patient's questions answered.  2) Alcoholism: per pt report he is  continuing in remission at this time and [facility] three [facility].  Encouraged him to continue this and/or join AA indefinitely.  Spent 30 min with pt today, with >50% of this  time spent in counseling and care coordination regarding the above problems.  An After Visit Summary was printed and given to the patient.  FOLLOW UP: Return in about 4 weeks (around 03/23/2017) for f/u mood/med.  Signed:  Crissie Sickles, MD           02/23/2017

## 2017-02-27 DIAGNOSIS — Z79899 Other long term (current) drug therapy: Secondary | ICD-10-CM | POA: Diagnosis not present

## 2017-03-01 DIAGNOSIS — Z79899 Other long term (current) drug therapy: Secondary | ICD-10-CM | POA: Diagnosis not present

## 2017-03-08 ENCOUNTER — Other Ambulatory Visit: Payer: Self-pay | Admitting: Family Medicine

## 2017-03-08 DIAGNOSIS — C9000 Multiple myeloma not having achieved remission: Secondary | ICD-10-CM | POA: Diagnosis not present

## 2017-03-08 NOTE — Telephone Encounter (Signed)
[place]  RF request for zolpidem LOV: 02/23/17 Next ov: 05/18/17 Last written: unknown  Please advise. Thanks.

## 2017-03-10 NOTE — Telephone Encounter (Signed)
Rx called into [facility].

## 2017-03-17 DIAGNOSIS — M47896 Other spondylosis, lumbar region: Secondary | ICD-10-CM | POA: Diagnosis not present

## 2017-03-17 DIAGNOSIS — M48061 Spinal stenosis, lumbar region without neurogenic claudication: Secondary | ICD-10-CM | POA: Diagnosis not present

## 2017-03-17 DIAGNOSIS — M5126 Other intervertebral disc displacement, lumbar region: Secondary | ICD-10-CM | POA: Diagnosis not present

## 2017-03-17 DIAGNOSIS — Z981 Arthrodesis status: Secondary | ICD-10-CM | POA: Diagnosis not present

## 2017-03-17 DIAGNOSIS — C9001 Multiple myeloma in remission: Secondary | ICD-10-CM | POA: Diagnosis not present

## 2017-03-17 DIAGNOSIS — M50221 Other cervical disc displacement at C4-C5 level: Secondary | ICD-10-CM | POA: Diagnosis not present

## 2017-03-22 ENCOUNTER — Ambulatory Visit: Payer: Self-pay | Admitting: Family Medicine

## 2017-03-22 DIAGNOSIS — M67432 Ganglion, left wrist: Secondary | ICD-10-CM | POA: Diagnosis not present

## 2017-03-22 DIAGNOSIS — L853 Xerosis cutis: Secondary | ICD-10-CM | POA: Diagnosis not present

## 2017-03-22 DIAGNOSIS — C9000 Multiple myeloma not having achieved remission: Secondary | ICD-10-CM | POA: Diagnosis not present

## 2017-03-22 DIAGNOSIS — L821 Other seborrheic keratosis: Secondary | ICD-10-CM | POA: Diagnosis not present

## 2017-03-22 DIAGNOSIS — H10023 Other mucopurulent conjunctivitis, bilateral: Secondary | ICD-10-CM | POA: Diagnosis not present

## 2017-03-22 DIAGNOSIS — D229 Melanocytic nevi, unspecified: Secondary | ICD-10-CM | POA: Diagnosis not present

## 2017-03-23 ENCOUNTER — Ambulatory Visit: Payer: Self-pay | Admitting: Family Medicine

## 2017-03-24 DIAGNOSIS — Z79899 Other long term (current) drug therapy: Secondary | ICD-10-CM | POA: Diagnosis not present

## 2017-03-27 ENCOUNTER — Ambulatory Visit: Payer: Self-pay | Admitting: Family Medicine

## 2017-03-29 ENCOUNTER — Other Ambulatory Visit: Payer: Self-pay | Admitting: Family Medicine

## 2017-04-05 DIAGNOSIS — M2011 Hallux valgus (acquired), right foot: Secondary | ICD-10-CM | POA: Diagnosis not present

## 2017-04-05 DIAGNOSIS — R5383 Other fatigue: Secondary | ICD-10-CM | POA: Diagnosis not present

## 2017-04-05 DIAGNOSIS — Z961 Presence of intraocular lens: Secondary | ICD-10-CM | POA: Diagnosis not present

## 2017-04-05 DIAGNOSIS — C9000 Multiple myeloma not having achieved remission: Secondary | ICD-10-CM | POA: Diagnosis not present

## 2017-04-05 DIAGNOSIS — H02883 Meibomian gland dysfunction of right eye, unspecified eyelid: Secondary | ICD-10-CM | POA: Diagnosis not present

## 2017-04-05 DIAGNOSIS — L237 Allergic contact dermatitis due to plants, except food: Secondary | ICD-10-CM | POA: Diagnosis not present

## 2017-04-05 DIAGNOSIS — C9001 Multiple myeloma in remission: Secondary | ICD-10-CM | POA: Diagnosis not present

## 2017-04-05 DIAGNOSIS — Z5111 Encounter for antineoplastic chemotherapy: Secondary | ICD-10-CM | POA: Diagnosis not present

## 2017-04-05 DIAGNOSIS — Z8546 Personal history of malignant neoplasm of prostate: Secondary | ICD-10-CM | POA: Diagnosis not present

## 2017-04-05 DIAGNOSIS — H02886 Meibomian gland dysfunction of left eye, unspecified eyelid: Secondary | ICD-10-CM | POA: Diagnosis not present

## 2017-04-05 DIAGNOSIS — Z923 Personal history of irradiation: Secondary | ICD-10-CM | POA: Diagnosis not present

## 2017-04-06 DIAGNOSIS — M6740 Ganglion, unspecified site: Secondary | ICD-10-CM | POA: Diagnosis not present

## 2017-04-09 DIAGNOSIS — M6740 Ganglion, unspecified site: Secondary | ICD-10-CM | POA: Insufficient documentation

## 2017-04-13 ENCOUNTER — Ambulatory Visit: Payer: Self-pay | Admitting: Family Medicine

## 2017-04-19 DIAGNOSIS — C9000 Multiple myeloma not having achieved remission: Secondary | ICD-10-CM | POA: Diagnosis not present

## 2017-04-19 DIAGNOSIS — Z5111 Encounter for antineoplastic chemotherapy: Secondary | ICD-10-CM | POA: Diagnosis not present

## 2017-04-28 ENCOUNTER — Other Ambulatory Visit: Payer: Self-pay | Admitting: Family Medicine

## 2017-05-03 DIAGNOSIS — C9000 Multiple myeloma not having achieved remission: Secondary | ICD-10-CM | POA: Diagnosis not present

## 2017-05-17 DIAGNOSIS — C9000 Multiple myeloma not having achieved remission: Secondary | ICD-10-CM | POA: Diagnosis not present

## 2017-05-17 DIAGNOSIS — Z87891 Personal history of nicotine dependence: Secondary | ICD-10-CM | POA: Diagnosis not present

## 2017-05-17 DIAGNOSIS — Z79818 Long term (current) use of other agents affecting estrogen receptors and estrogen levels: Secondary | ICD-10-CM | POA: Diagnosis not present

## 2017-05-17 DIAGNOSIS — C61 Malignant neoplasm of prostate: Secondary | ICD-10-CM | POA: Diagnosis not present

## 2017-05-17 DIAGNOSIS — Z923 Personal history of irradiation: Secondary | ICD-10-CM | POA: Diagnosis not present

## 2017-05-17 DIAGNOSIS — Z9079 Acquired absence of other genital organ(s): Secondary | ICD-10-CM | POA: Diagnosis not present

## 2017-05-17 DIAGNOSIS — Z5111 Encounter for antineoplastic chemotherapy: Secondary | ICD-10-CM | POA: Diagnosis not present

## 2017-05-18 ENCOUNTER — Encounter: Payer: Self-pay | Admitting: Family Medicine

## 2017-05-31 DIAGNOSIS — C9000 Multiple myeloma not having achieved remission: Secondary | ICD-10-CM | POA: Diagnosis not present

## 2017-06-05 ENCOUNTER — Ambulatory Visit (INDEPENDENT_AMBULATORY_CARE_PROVIDER_SITE_OTHER): Payer: Medicare Other | Admitting: Family Medicine

## 2017-06-05 ENCOUNTER — Encounter: Payer: Self-pay | Admitting: Family Medicine

## 2017-06-05 ENCOUNTER — Other Ambulatory Visit: Payer: Self-pay

## 2017-06-05 ENCOUNTER — Ambulatory Visit: Payer: Medicare Other

## 2017-06-05 VITALS — BP 152/78 | HR 81 | Temp 98.8°F | Resp 18 | Ht 69.5 in | Wt 251.8 lb

## 2017-06-05 DIAGNOSIS — B9789 Other viral agents as the cause of diseases classified elsewhere: Secondary | ICD-10-CM

## 2017-06-05 DIAGNOSIS — J069 Acute upper respiratory infection, unspecified: Secondary | ICD-10-CM

## 2017-06-05 DIAGNOSIS — F411 Generalized anxiety disorder: Secondary | ICD-10-CM | POA: Diagnosis not present

## 2017-06-05 DIAGNOSIS — F3342 Major depressive disorder, recurrent, in full remission: Secondary | ICD-10-CM

## 2017-06-05 DIAGNOSIS — Z0000 Encounter for general adult medical examination without abnormal findings: Secondary | ICD-10-CM | POA: Diagnosis not present

## 2017-06-05 NOTE — Progress Notes (Signed)
OFFICE VISIT  06/05/2017   CC:  Chief Complaint  Patient presents with  . Medicare Wellness  Also f/u mood/meds.  HPI:    Patient is a 77 y.o. Caucasian male who presents accompanied by his wife for 3 mo f/u mood/meds. Last visit I increased his fluoxetine to 67m qd.  At the time, his wife came to the visit with him and she essentially said he has lots of anx and dep and completely disagreed.  He agreed to trial of increase fluoxetine seemingly just to appease his wife.  Doing much, much better--calmer, less irritable, more friendly, not depressed.  Both he and his wife are very pleased with how he is doing!  Has had about 3 days nasal cong, runny nose, mild ST, some cough.  Delsym helping. Subjective fever at onset but not since.  Taking otc cold med.  No body aches.  No n/v/d or rash.  Past Medical History:  Diagnosis Date  . [place]    [facility] three times per week as of 01/2017.  .Marland KitchenBone lesion   . Depression   . Family history of colon cancer in mother   . Hyperlipidemia   . Hypertension   . Insomnia    uses quetiapine for this  . Multiple myeloma ([place]    [facility]  . Nephrolithiasis   . Osteoarthritis    C-spine--hx of fusion surgery.  R foot. Knees (hx of L TKA)  . Prostate cancer ([facility]    [facility]    Past Surgical History:  Procedure Laterality Date  . ANTERIOR FUSION CERVICAL SPINE  2002  . [facility]  [facility] [place]  .[place]  2015   [place] [place]  . EXCISIONAL TOTAL KNEE ARTHROPLASTY  2014 and 2016   Both knees  . LAPAROSCOPIC RETROPUBIC PROSTATECTOMY     + hx of radiation to pelvis.    Outpatient Medications Prior to Visit  Medication Sig Dispense Refill  . acetaminophen (TYLENOL) 325 MG tablet Take 650 mg by mouth every 4 (four) hours as needed.     .Marland Kitchenacyclovir (ZOVIRAX) 400 MG tablet Take 1  tablet (400 mg total) by mouth daily. 90 tablet 3  . amLODipine (NORVASC) 10 MG tablet Take one tablet (10 mg total) by mouth daily. 90 tablet 1  . ammonium lactate (AMLACTIN) 12 % cream Apply at least once daily after bathing and patting dry.  99  . aspirin EC 81 MG tablet Take by mouth.    . bortezomib IV (VELCADE) 3.5 MG injection Inject into the skin.    . clobetasol (TEMOVATE) 0.05 % external solution Apply topically.    .Marland Kitchenerythromycin ophthalmic ointment Apply to lashes area of both eyelids qid x 5 days as needed when eyes are significantly more irritated than usual 3.5 g 3  . Fluocinolone Acetonide Body 0.01 % OIL Apply daily to affected areas.  Stop when smooth.    . fluorouracil (EFUDEX) 5 % cream Apply once daily to scalp for two weeks  2  . FLUoxetine (PROZAC) 40 MG capsule Take 1 capsule (40 mg total) by mouth daily. 30 capsule 5  . multivitamin (ONE-A-DAY MEN'S) TABS tablet Take 1 tablet by mouth daily. 30 tablet 1  . ondansetron (ZOFRAN ODT) 4 MG disintegrating tablet Take 1 tablet (4 mg total) by mouth every 8 (eight) hours as needed for nausea  or vomiting. 20 tablet 0  . QUEtiapine (SEROQUEL) 100 MG tablet Take 1 tablet (100 mg total) by mouth at bedtime. 90 tablet 3  . thiamine (VITAMIN B-1) 100 MG tablet Take 1 tablet (100 mg total) by mouth daily. 30 tablet 1  . zolendronic acid (ZOMETA) 4 MG/5ML injection Inject into the vein.    Marland Kitchen zolpidem (AMBIEN) 10 MG tablet TAKE ONE TABLET BY MOUTH NIGHTLY AS NEEDED FOR SLEEP 30 tablet 5  . ondansetron (ZOFRAN) 8 MG tablet TAKE 1 TABLET (8 MG TOTAL) BY MOUTH EVERY 8 (EIGHT) HOURS AS NEEDED FOR NAUSEA FOR UP TO 30 DOSES.  3   No facility-administered medications prior to visit.     No Known Allergies  ROS As per HPI  PE: Blood pressure (!) 152/78, pulse 81, temperature 98.8 F (37.1 C), temperature source Oral, resp. rate 18, height 5' 9.5" (1.765 m), weight 251 lb 12.8 oz (114.2 kg), SpO2 96 %. VS: noted. Gen: alert, NAD,  NONTOXIC APPEARING. HEENT: eyes without injection, drainage, or swelling.  Ears: EACs clear, TMs with normal light reflex and landmarks.  Nose: Clear rhinorrhea, with some dried, crusty exudate adherent to mildly injected mucosa.  No purulent d/c.  No paranasal sinus TTP.  No facial swelling.  Throat and mouth without focal lesion.  No pharyngial swelling, erythema, or exudate.   Neck: supple, no LAD.   LUNGS: CTA bilat, nonlabored resps.   CV: RRR, no m/r/g. EXT: no c/c/e SKIN: no rash  LABS:  No results found for: TSH Lab Results  Component Value Date   WBC 6.8 01/03/2017   HGB 13.5 01/03/2017   HCT 40.1 01/03/2017   MCV 89.9 01/03/2017   PLT 184 01/03/2017   Lab Results  Component Value Date   CREATININE 0.78 01/03/2017   BUN 15 01/03/2017   NA 138 01/03/2017   K 3.8 01/03/2017   CL 102 01/03/2017   CO2 27 01/03/2017   Lab Results  Component Value Date   ALT 20 01/03/2017   AST 26 01/03/2017   ALKPHOS 60 01/03/2017   BILITOT 0.5 01/03/2017   No results found for: CHOL No results found for: HDL No results found for: LDLCALC No results found for: TRIG No results found for: CHOLHDL No results found for: PSA  No results found for: HGBA1C  IMPRESSION AND PLAN:  MDD, recurrent, in remission. GAD: well controlled. The current medical regimen is effective;  continue present plan and medications. No lab work today.  An After Visit Summary was printed and given to the patient.  FOLLOW UP: Return in about 3 months (around 09/03/2017) for routine chronic illness f/u.  Signed:  Crissie Sickles, MD           06/05/2017

## 2017-06-05 NOTE — Progress Notes (Signed)
Subjective:   Derek Castro is a 77 y.o. male who presents for an Initial Medicare Annual Wellness Visit.  Review of Systems  No ROS.  Medicare Wellness Visit. Additional risk factors are reflected in the social history.  Cardiac Risk Factors include: advanced age (>21mn, >>7women);dyslipidemia;hypertension;male gender;obesity (BMI >30kg/m2);sedentary lifestyle;family history of premature cardiovascular disease   Sleep patterns: Sleeps 8 hours, Ambien occasionally.  Home Safety/Smoke Alarms: Feels safe in home. Smoke alarms in place.  Living environment; residence and Firearm Safety: Lives with wife in 2 story home.   Seat Belt Safety/Bike Helmet: Wears seat belt.   Male:   CCS-Colonoscopy 2015, pt reports polyp. Recall 5 years.    PSA- H[facility]      Objective:    Today's Vitals   06/05/17 1011  BP: (!) 152/78  Pulse: 81  Resp: 18  Temp: 98.8 F (37.1 C)  TempSrc: Oral  SpO2: 96%  Weight: 251 lb 12.8 oz (114.2 kg)  Height: 5' 9.5" (1.765 m)   Body mass index is 36.65 kg/m.  Advanced Directives 06/05/2017 01/03/2017 08/01/2016  Does Patient Have a Medical Advance Directive? Yes No Yes  [place] Living [place]  [place]? No - copy requested - -  Would patient like information on creating a medical advance directive? - No - Patient declined -    Current Medications (verified) Outpatient Encounter Medications as of 06/05/2017  Medication Sig  . acetaminophen (TYLENOL) 325 MG tablet Take 650 mg by mouth every 4 (four) hours as needed.   .Marland Kitchenacyclovir (ZOVIRAX) 400 MG tablet Take 1 tablet (400 mg total) by mouth daily.  .Marland KitchenamLODipine (NORVASC) 10 MG tablet Take one tablet (10 mg total) by mouth daily.  .Marland Kitchenammonium lactate (AMLACTIN) 12 % cream Apply at least once daily after bathing and patting dry.  .Marland Kitchenaspirin EC 81 MG tablet Take by mouth.  .  bortezomib IV (VELCADE) 3.5 MG injection Inject into the skin.  . clobetasol (TEMOVATE) 0.05 % external solution Apply topically.  .Marland Kitchenerythromycin ophthalmic ointment Apply to lashes area of both eyelids qid x 5 days as needed when eyes are significantly more irritated than usual  . Fluocinolone Acetonide Body 0.01 % OIL Apply daily to affected areas.  Stop when smooth.  . fluorouracil (EFUDEX) 5 % cream Apply once daily to scalp for two weeks  . FLUoxetine (PROZAC) 40 MG capsule Take 1 capsule (40 mg total) by mouth daily.  . multivitamin (ONE-A-DAY MEN'S) TABS tablet Take 1 tablet by mouth daily.  . ondansetron (ZOFRAN ODT) 4 MG disintegrating tablet Take 1 tablet (4 mg total) by mouth every 8 (eight) hours as needed for nausea or vomiting.  .Marland KitchenQUEtiapine (SEROQUEL) 100 MG tablet Take 1 tablet (100 mg total) by mouth at bedtime.  . thiamine (VITAMIN B-1) 100 MG tablet Take 1 tablet (100 mg total) by mouth daily.  .Marland Kitchenzolendronic acid (ZOMETA) 4 MG/5ML injection Inject into the vein.  .Marland Kitchenzolpidem (AMBIEN) 10 MG tablet TAKE ONE TABLET BY MOUTH NIGHTLY AS NEEDED FOR SLEEP  . ondansetron (ZOFRAN) 8 MG tablet TAKE 1 TABLET (8 MG TOTAL) BY MOUTH EVERY 8 (EIGHT) HOURS AS NEEDED FOR NAUSEA FOR UP TO 30 DOSES.   No facility-administered encounter medications on file as of 06/05/2017.     Allergies (verified) Patient has no known allergies.   History: Past Medical History:  Diagnosis Date  .  Alcoholism in remission ([place])    [facility] three times per week as of 01/2017.  Marland Kitchen Bone lesion   . Depression   . Family history of colon cancer in mother   . Hyperlipidemia   . Hypertension   . Insomnia    uses quetiapine for this  . Multiple myeloma ([place])    [facility]  . Nephrolithiasis   . Osteoarthritis    C-spine--hx of fusion surgery.  R foot. Knees (hx of L TKA)  . Prostate cancer [facility])    [facility]   Past Surgical History:  Procedure Laterality Date    . ANTERIOR FUSION CERVICAL SPINE  2002  . autologous stem cell transplant  2010 and 2012   [facility] in [place].  Marland Kitchen COLONOSCOPY  2015   Recall 5 yrs ([facility] in [place], [place])  . EXCISIONAL TOTAL KNEE ARTHROPLASTY  2014 and 2016   Both knees  . LAPAROSCOPIC RETROPUBIC PROSTATECTOMY     + hx of radiation to pelvis.   Family History  Problem Relation Age of Onset  . Cancer Mother        colon   Social History   Socioeconomic History  . Marital status: Married    Spouse name: None  . Number of children: None  . Years of education: None  . Highest education level: None  Social Needs  . Financial resource strain: None  . Food insecurity - worry: None  . Food insecurity - inability: None  . Transportation needs - medical: None  . Transportation needs - non-medical: None  Occupational History  . None  Tobacco Use  . Smoking status: Former Smoker    Packs/day: 1.00    Years: 5.00    Pack years: 5.00    Types: Cigarettes    Last attempt to quit: 02/11/1996    Years since quitting: 21.3  . Smokeless tobacco: Never Used  Substance and Sexual Activity  . Alcohol use: No  . Drug use: No  . Sexual activity: Not Currently  Other Topics Concern  . None  Social History Narrative   Married, 3 children, 11 grandchildren.   Orig from [place], relocated to [place] [facility] 08/2015.   Retired: owned a Copywriter, advertising.   No Tob/alcohol/drugs (smoke cigs x 6 yrs, quit in the 1970s).   Tobacco Counseling Counseling given: Not Answered   Activities of Daily Living In your present state of health, do you have any difficulty performing the following activities: 06/05/2017  Hearing? N  Vision? N  Difficulty concentrating or making decisions? N  Walking or climbing stairs? N  Dressing or bathing? N  Doing errands, shopping? N  Preparing Food and eating ? N  Using the Toilet? N  In the past six months, have you accidently leaked urine? N  Do you have problems with loss of  bowel control? N  Managing your Medications? N  Managing your Finances? N  Housekeeping or managing your Housekeeping? N  Some recent data might be hidden     Immunizations and Health Maintenance  There is no immunization history on file for this patient. Health Maintenance Due  Topic Date Due  . COLONOSCOPY  04/24/1992    Patient Care Team: Tammi Sou, MD as PCP - General (Family Medicine) Jeanann Lewandowsky, MD as Consulting Physician (Internal Medicine)  Indicate any recent Medical Services you may have received from other than [facility] providers in the past year (date may be approximate).  Assessment:   This is a routine wellness examination for Jurgen.  Hearing/Vision screen Hearing Screening Comments: Pt declines testing.  Vision Screening Comments: Last exam 11/2016, [facility]. H/O cataract surgery.   Dietary issues and exercise activities discussed: Exercise limited by: None identified   Diet (meal preparation, eat out, water intake, caffeinated beverages, dairy products, fruits and vegetables): Drinks soda, water (small amt) and coffee.   Breakfast: eggs, bacon. Occasionally skips.  Lunch: hamburger; salads.  Dinner: protein and vegetables.      Goals    . Weight (lb) < 200 lb (90.7 kg)     Lose weight by watching sugars/carbs and staying active.       Depression Screen PHQ 2/9 Scores 06/05/2017 02/23/2017 08/01/2016 03/28/2016  PHQ - 2 Score 0 0 0 0  PHQ- 9 Score - 0 - -    Fall Risk Fall Risk  06/05/2017 08/01/2016 03/28/2016  Falls in the past year? No No No    Cognitive Function:       Ad8 score reviewed for issues:  Issues making decisions: no  Less interest in hobbies / activities: no  Repeats questions, stories (family complaining): no  Trouble using ordinary gadgets (microwave, computer, phone): no  Forgets the month or year: no  Mismanaging finances: no  Remembering appts: no  Daily problems with thinking and/or memory: no Ad8 score  is=0     Screening Tests Health Maintenance  Topic Date Due  . COLONOSCOPY  04/24/1992  . INFLUENZA VACCINE  08/27/2017 (Originally 12/28/2016)  . TETANUS/TDAP  06/05/2018 (Originally 04[birth year]24/1961)  . PNA vac Low Risk Adult (1 of 2 - PCV13) 06/05/2018 (Originally 04/25/2007)         Plan:    Bring a copy of your living will and/or healthcare power of attorney to your next office visit.  Start doing brain stimulating activities (puzzles, reading, adult coloring books, staying active) to keep memory sharp.    I have personally reviewed and noted the following in the patient's chart:   . Medical and social history . Use of alcohol, tobacco or illicit drugs  . Current medications and supplements . Functional ability and status . Nutritional status . Physical activity . Advanced directives . List of other physicians . Hospitalizations, surgeries, and ER visits in previous 12 months . Vitals . Screenings to include cognitive, depression, and falls . Referrals and appointments  In addition, I have reviewed and discussed with patient certain preventive protocols, quality metrics, and best practice recommendations. A written personalized care plan for preventive services as well as general preventive health recommendations were provided to patient.     Gerilyn Nestle, RN   06/05/2017

## 2017-06-05 NOTE — Patient Instructions (Signed)
Bring a copy of your living will and/or healthcare power of attorney to your next office visit.  Start doing brain stimulating activities (puzzles, reading, adult coloring books, staying active) to keep memory sharp.    Health Maintenance, Male A healthy lifestyle and preventive care is important for your health and wellness. Ask your health care provider about what schedule of regular examinations is right for you. What should I know about weight and diet? Eat a Healthy Diet  Eat plenty of vegetables, fruits, whole grains, low-fat dairy products, and lean protein.  Do not eat a lot of foods high in solid fats, added sugars, or salt.  Maintain a Healthy Weight Regular exercise can help you achieve or maintain a healthy weight. You should:  Do at least 150 minutes of exercise each week. The exercise should increase your heart rate and make you sweat (moderate-intensity exercise).  Do strength-training exercises at least twice a week.  Watch Your Levels of Cholesterol and Blood Lipids  Have your blood tested for lipids and cholesterol every 5 years starting at 76 years of age. If you are at high risk for heart disease, you should start having your blood tested when you are 76 years old. You may need to have your cholesterol levels checked more often if: ? Your lipid or cholesterol levels are high. ? You are older than 76 years of age. ? You are at high risk for heart disease.  What should I know about cancer screening? Many types of cancers can be detected early and may often be prevented. Lung Cancer  You should be screened every year for lung cancer if: ? You are a current smoker who has smoked for at least 30 years. ? You are a former smoker who has quit within the past 15 years.  Talk to your health care provider about your screening options, when you should start screening, and how often you should be screened.  Colorectal Cancer  Routine colorectal cancer screening usually  begins at 76 years of age and should be repeated every 5-10 years until you are 76 years old. You may need to be screened more often if early forms of precancerous polyps or small growths are found. Your health care provider may recommend screening at an earlier age if you have risk factors for colon cancer.  Your health care provider may recommend using home test kits to check for hidden blood in the stool.  A small camera at the end of a tube can be used to examine your colon (sigmoidoscopy or colonoscopy). This checks for the earliest forms of colorectal cancer.  Prostate and Testicular Cancer  Depending on your age and overall health, your health care provider may do certain tests to screen for prostate and testicular cancer.  Talk to your health care provider about any symptoms or concerns you have about testicular or prostate cancer.  Skin Cancer  Check your skin from head to toe regularly.  Tell your health care provider about any new moles or changes in moles, especially if: ? There is a change in a mole's size, shape, or color. ? You have a mole that is larger than a pencil eraser.  Always use sunscreen. Apply sunscreen liberally and repeat throughout the day.  Protect yourself by wearing long sleeves, pants, a wide-brimmed hat, and sunglasses when outside.  What should I know about heart disease, diabetes, and high blood pressure?  If you are 37-34 years of age, have your blood pressure checked every  3-5 years. If you are 40 years of age or older, have your blood pressure checked every year. You should have your blood pressure measured twice-once when you are at a hospital or clinic, and once when you are not at a hospital or clinic. Record the average of the two measurements. To check your blood pressure when you are not at a hospital or clinic, you can use: ? An automated blood pressure machine at a pharmacy. ? A home blood pressure monitor.  Talk to your health care  provider about your target blood pressure.  If you are between 45-79 years old, ask your health care provider if you should take aspirin to prevent heart disease.  Have regular diabetes screenings by checking your fasting blood sugar level. ? If you are at a normal weight and have a low risk for diabetes, have this test once every three years after the age of 45. ? If you are overweight and have a high risk for diabetes, consider being tested at a younger age or more often.  A one-time screening for abdominal aortic aneurysm (AAA) by ultrasound is recommended for men aged 65-75 years who are current or former smokers. What should I know about preventing infection? Hepatitis B If you have a higher risk for hepatitis B, you should be screened for this virus. Talk with your health care provider to find out if you are at risk for hepatitis B infection. Hepatitis C Blood testing is recommended for:  Everyone born from 1945 through 1965.  Anyone with known risk factors for hepatitis C.  Sexually Transmitted Diseases (STDs)  You should be screened each year for STDs including gonorrhea and chlamydia if: ? You are sexually active and are younger than 76 years of age. ? You are older than 76 years of age and your health care provider tells you that you are at risk for this type of infection. ? Your sexual activity has changed since you were last screened and you are at an increased risk for chlamydia or gonorrhea. Ask your health care provider if you are at risk.  Talk with your health care provider about whether you are at high risk of being infected with HIV. Your health care provider may recommend a prescription medicine to help prevent HIV infection.  What else can I do?  Schedule regular health, dental, and eye exams.  Stay current with your vaccines (immunizations).  Do not use any tobacco products, such as cigarettes, chewing tobacco, and e-cigarettes. If you need help quitting, ask  your health care provider.  Limit alcohol intake to no more than 2 drinks per day. One drink equals 12 ounces of beer, 5 ounces of wine, or 1 ounces of hard liquor.  Do not use street drugs.  Do not share needles.  Ask your health care provider for help if you need support or information about quitting drugs.  Tell your health care provider if you often feel depressed.  Tell your health care provider if you have ever been abused or do not feel safe at home. This information is not intended to replace advice given to you by your health care provider. Make sure you discuss any questions you have with your health care provider. Document Released: 11/12/2007 Document Revised: 01/13/2016 Document Reviewed: 02/17/2015 Elsevier Interactive Patient Education  2018 Elsevier Inc.  

## 2017-06-07 NOTE — Progress Notes (Signed)
AWV reviewed and agree. Signed:  Phil McGowen, MD           06/07/2017  

## 2017-06-08 DIAGNOSIS — J209 Acute bronchitis, unspecified: Secondary | ICD-10-CM | POA: Diagnosis not present

## 2017-06-14 ENCOUNTER — Encounter: Payer: Self-pay | Admitting: Family Medicine

## 2017-06-14 ENCOUNTER — Ambulatory Visit (INDEPENDENT_AMBULATORY_CARE_PROVIDER_SITE_OTHER): Payer: Medicare Other | Admitting: Family Medicine

## 2017-06-14 VITALS — BP 118/56 | HR 57 | Temp 99.1°F | Resp 16 | Ht 69.5 in | Wt 252.0 lb

## 2017-06-14 DIAGNOSIS — R52 Pain, unspecified: Secondary | ICD-10-CM

## 2017-06-14 DIAGNOSIS — R05 Cough: Secondary | ICD-10-CM

## 2017-06-14 DIAGNOSIS — H903 Sensorineural hearing loss, bilateral: Secondary | ICD-10-CM

## 2017-06-14 DIAGNOSIS — B349 Viral infection, unspecified: Secondary | ICD-10-CM

## 2017-06-14 DIAGNOSIS — R059 Cough, unspecified: Secondary | ICD-10-CM

## 2017-06-14 DIAGNOSIS — Z79899 Other long term (current) drug therapy: Secondary | ICD-10-CM | POA: Diagnosis not present

## 2017-06-14 DIAGNOSIS — T451X5A Adverse effect of antineoplastic and immunosuppressive drugs, initial encounter: Secondary | ICD-10-CM

## 2017-06-14 LAB — CBC WITH DIFFERENTIAL/PLATELET
BASOS ABS: 0.1 10*3/uL (ref 0.0–0.1)
Basophils Relative: 0.9 % (ref 0.0–3.0)
EOS ABS: 0.1 10*3/uL (ref 0.0–0.7)
Eosinophils Relative: 0.7 % (ref 0.0–5.0)
HEMATOCRIT: 34 % — AB (ref 39.0–52.0)
HEMOGLOBIN: 11.1 g/dL — AB (ref 13.0–17.0)
LYMPHS PCT: 7.4 % — AB (ref 12.0–46.0)
Lymphs Abs: 0.8 10*3/uL (ref 0.7–4.0)
MCHC: 32.5 g/dL (ref 30.0–36.0)
MCV: 93.2 fl (ref 78.0–100.0)
MONO ABS: 1.1 10*3/uL — AB (ref 0.1–1.0)
Monocytes Relative: 10.3 % (ref 3.0–12.0)
Neutro Abs: 8.8 10*3/uL — ABNORMAL HIGH (ref 1.4–7.7)
Neutrophils Relative %: 80.7 % — ABNORMAL HIGH (ref 43.0–77.0)
Platelets: 251 10*3/uL (ref 150.0–400.0)
RBC: 3.65 Mil/uL — ABNORMAL LOW (ref 4.22–5.81)
RDW: 15.2 % (ref 11.5–15.5)

## 2017-06-14 LAB — COMPREHENSIVE METABOLIC PANEL
ALT: 42 U/L (ref 0–53)
AST: 34 U/L (ref 0–37)
Albumin: 3.5 g/dL (ref 3.5–5.2)
Alkaline Phosphatase: 53 U/L (ref 39–117)
BILIRUBIN TOTAL: 0.5 mg/dL (ref 0.2–1.2)
BUN: 10 mg/dL (ref 6–23)
CALCIUM: 9.1 mg/dL (ref 8.4–10.5)
CO2: 35 mEq/L — ABNORMAL HIGH (ref 19–32)
CREATININE: 0.87 mg/dL (ref 0.40–1.50)
Chloride: 96 mEq/L (ref 96–112)
GFR: 90.89 mL/min (ref >60.00)
Glucose, Bld: 152 mg/dL — ABNORMAL HIGH (ref 70–99)
Potassium: 3.9 mEq/L (ref 3.5–5.1)
SODIUM: 138 meq/L (ref 135–145)
Total Protein: 6.2 g/dL (ref 6.0–8.3)

## 2017-06-14 LAB — POC INFLUENZA A&B (BINAX/QUICKVUE)
INFLUENZA A, POC: NEGATIVE
Influenza B, POC: NEGATIVE

## 2017-06-14 NOTE — Progress Notes (Signed)
OFFICE VISIT  06/14/2017   CC:  Chief Complaint  Patient presents with  . Cough    HPI:    Patient is a 77 y.o. Caucasian male who presents for cough. Onset about 1 week ago, fatigue, some ST off/on, clearing throat a lot, achy body. Subjective fever off/on.   Tried coracedin, cough drops--helps some. Went to [facility] about 2 days into illness and was rx'd Z pack--he is no better.  He has multiple myeloma, is on velcade.  He says he has trouble hearing out of both ears at times, esp the voices of kids. No ear pain.  No ringing in ears.  Past Medical History:  Diagnosis Date  . Alcoholism in remission ([place])    [facility] three times per [facility] as of 01/2017.  Marland Kitchen Bone lesion   . Depression   . Family history of colon cancer in mother   . Hyperlipidemia   . Hypertension   . Insomnia    uses quetiapine for this  . Multiple myeloma ([place])    [facility]  . Nephrolithiasis   . Osteoarthritis    C-spine--hx of fusion surgery.  R foot. Knees (hx of L TKA)  . Prostate cancer [facility])    [facility]    Past Surgical History:  Procedure Laterality Date  . ANTERIOR FUSION CERVICAL SPINE  2002  . autologous stem cell transplant  2010 and 2012   [facility] in [place].  Marland Kitchen COLONOSCOPY  2015   Recall 5 yrs ([facility] in [place], [place])  . EXCISIONAL TOTAL KNEE ARTHROPLASTY  2014 and 2016   Both knees  . LAPAROSCOPIC RETROPUBIC PROSTATECTOMY     + hx of radiation to pelvis.    Outpatient Medications Prior to Visit  Medication Sig Dispense Refill  . acetaminophen (TYLENOL) 325 MG tablet Take 650 mg by mouth every 4 (four) hours as needed.     Marland Kitchen acyclovir (ZOVIRAX) 400 MG tablet Take 1 tablet (400 mg total) by mouth daily. 90 tablet 3  . amLODipine (NORVASC) 10 MG tablet Take one tablet (10 mg total) by mouth daily. 90 tablet 1  . ammonium lactate (AMLACTIN) 12 % cream Apply at least once daily after bathing and patting dry.  99  . aspirin EC  81 MG tablet Take by mouth.    . bortezomib IV (VELCADE) 3.5 MG injection Inject into the skin.    . clobetasol (TEMOVATE) 0.05 % external solution Apply topically.    Marland Kitchen erythromycin ophthalmic ointment Apply to lashes area of both eyelids qid x 5 days as needed when eyes are significantly more irritated than usual 3.5 g 3  . Fluocinolone Acetonide Body 0.01 % OIL Apply daily to affected areas.  Stop when smooth.    . fluorouracil (EFUDEX) 5 % cream Apply once daily to scalp for two weeks  2  . FLUoxetine (PROZAC) 40 MG capsule Take 1 capsule (40 mg total) by mouth daily. 30 capsule 5  . multivitamin (ONE-A-DAY MEN'S) TABS tablet Take 1 tablet by mouth daily. 30 tablet 1  . ondansetron (ZOFRAN ODT) 4 MG disintegrating tablet Take 1 tablet (4 mg total) by mouth every 8 (eight) hours as needed for nausea or vomiting. 20 tablet 0  . ondansetron (ZOFRAN) 8 MG tablet TAKE 1 TABLET (8 MG TOTAL) BY MOUTH EVERY 8 (EIGHT) HOURS AS NEEDED FOR NAUSEA FOR UP TO 30 DOSES.  3  . QUEtiapine (SEROQUEL) 100 MG tablet Take 1 tablet (100 mg  total) by mouth at bedtime. 90 tablet 3  . thiamine (VITAMIN B-1) 100 MG tablet Take 1 tablet (100 mg total) by mouth daily. 30 tablet 1  . zolendronic acid (ZOMETA) 4 MG/5ML injection Inject into the vein.    Marland Kitchen zolpidem (AMBIEN) 10 MG tablet TAKE ONE TABLET BY MOUTH NIGHTLY AS NEEDED FOR SLEEP 30 tablet 5   No facility-administered medications prior to visit.     No Known Allergies  ROS As per HPI  PE: Blood pressure (!) 118/56, pulse (!) 57, temperature 99.1 F (37.3 C), temperature source Temporal, resp. rate 16, height 5' 9.5" (1.765 m), weight 252 lb (114.3 kg), SpO2 96 %. VS: noted--normal. Gen: alert, NAD, NONTOXIC APPEARING. HEENT: eyes without injection, drainage, or swelling.  Ears: EACs clear, TMs with normal light reflex and landmarks.  Nose: Clear rhinorrhea, with some dried, crusty exudate adherent to mildly injected mucosa.  No purulent d/c.  No paranasal  sinus TTP.  No facial swelling.  Throat and mouth without focal lesion.  No pharyngial swelling, erythema, or exudate.   Neck: supple, no LAD.   LUNGS: CTA bilat, nonlabored resps.   CV: RRR, no m/r/g. EXT: no c/c/e SKIN: no rash  LABS:  Lab Results  Component Value Date   WBC 6.8 01/03/2017   HGB 13.5 01/03/2017   HCT 40.1 01/03/2017   MCV 89.9 01/03/2017   PLT 184 01/03/2017     Chemistry      Component Value Date/Time   NA 138 01/03/2017 2323   K 3.8 01/03/2017 2323   CL 102 01/03/2017 2323   CO2 27 01/03/2017 2323   BUN 15 01/03/2017 2323   CREATININE 0.78 01/03/2017 2323      Component Value Date/Time   CALCIUM 9.4 01/03/2017 2323   ALKPHOS 60 01/03/2017 2323   AST 26 01/03/2017 2323   ALT 20 01/03/2017 2323   BILITOT 0.5 01/03/2017 2323     Flu A/B today: NEG  Hearing screen today: he could not tell which ear he was hearing a beep in: essentially at 500 Hz he said he could hear beep at 25 db but couldn't identify which ear.  No further frequencies tried due to patient inability to complete test.  IMPRESSION AND PLAN:  1) viral syndrome (resp). Flu test today: NEG Get otc generic robitussin DM OR Mucinex DM and use as directed on the packaging for cough and congestion. Use otc generic saline nasal spray 2-3 times per day to irrigate/moisturize your nasal passages. Check CBC and CMET given his immunosuppressed status from velcade.  An After Visit Summary was printed and given to the patient.  FOLLOW UP: Return if symptoms worsen or fail to improve.  Signed:  Crissie Sickles, MD           06/14/2017

## 2017-06-14 NOTE — Patient Instructions (Addendum)
Get otc generic robitussin DM OR Mucinex DM and use as directed on the packaging for cough and congestion. Use otc generic saline nasal spray 2-3 times per day to irrigate/moisturize your nasal passages.  Continue cough drops but stop any other over the counter cough/cold medication. Drink lots of clear fluids.

## 2017-06-15 ENCOUNTER — Ambulatory Visit (HOSPITAL_BASED_OUTPATIENT_CLINIC_OR_DEPARTMENT_OTHER)
Admission: RE | Admit: 2017-06-15 | Discharge: 2017-06-15 | Disposition: A | Discharge status: Home / Self Care | Payer: Medicare Other | Source: Ambulatory Visit | Attending: Family Medicine | Admitting: Family Medicine

## 2017-06-15 ENCOUNTER — Other Ambulatory Visit: Payer: Self-pay | Admitting: Family Medicine

## 2017-06-15 DIAGNOSIS — D729 Disorder of white blood cells, unspecified: Secondary | ICD-10-CM

## 2017-06-15 DIAGNOSIS — R059 Cough, unspecified: Secondary | ICD-10-CM

## 2017-06-15 DIAGNOSIS — D849 Immunodeficiency, unspecified: Secondary | ICD-10-CM

## 2017-06-15 DIAGNOSIS — J181 Lobar pneumonia, unspecified organism: Secondary | ICD-10-CM | POA: Diagnosis not present

## 2017-06-15 DIAGNOSIS — D899 Disorder involving the immune mechanism, unspecified: Secondary | ICD-10-CM | POA: Diagnosis present

## 2017-06-15 DIAGNOSIS — R05 Cough: Secondary | ICD-10-CM

## 2017-06-16 ENCOUNTER — Other Ambulatory Visit: Payer: Self-pay | Admitting: Family Medicine

## 2017-06-16 MED ORDER — CEFDINIR 300 MG PO CAPS: 300.0000 mg | ORAL_CAPSULE | Freq: Two times a day (BID) | ORAL | 0 refills | Status: DC

## 2017-06-16 NOTE — Progress Notes (Signed)
AWV reviewed and agree.  Signed:  Phil McGowen, MD           06/16/2017  

## 2017-06-26 ENCOUNTER — Ambulatory Visit (INDEPENDENT_AMBULATORY_CARE_PROVIDER_SITE_OTHER): Payer: Medicare Other | Admitting: Family Medicine

## 2017-06-26 ENCOUNTER — Ambulatory Visit: Payer: Self-pay | Admitting: Family Medicine

## 2017-06-26 ENCOUNTER — Encounter: Payer: Self-pay | Admitting: Family Medicine

## 2017-06-26 ENCOUNTER — Ambulatory Visit (HOSPITAL_BASED_OUTPATIENT_CLINIC_OR_DEPARTMENT_OTHER)
Admission: RE | Admit: 2017-06-26 | Discharge: 2017-06-26 | Disposition: A | Discharge status: Home / Self Care | Payer: Medicare Other | Source: Ambulatory Visit | Attending: Family Medicine | Admitting: Family Medicine

## 2017-06-26 VITALS — BP 142/64 | HR 84 | Temp 98.3°F | Resp 16 | Wt 242.0 lb

## 2017-06-26 DIAGNOSIS — Z79899 Other long term (current) drug therapy: Secondary | ICD-10-CM | POA: Diagnosis not present

## 2017-06-26 DIAGNOSIS — R5383 Other fatigue: Secondary | ICD-10-CM | POA: Diagnosis not present

## 2017-06-26 DIAGNOSIS — R634 Abnormal weight loss: Secondary | ICD-10-CM

## 2017-06-26 DIAGNOSIS — T451X5A Adverse effect of antineoplastic and immunosuppressive drugs, initial encounter: Secondary | ICD-10-CM

## 2017-06-26 DIAGNOSIS — I2510 Atherosclerotic heart disease of native coronary artery without angina pectoris: Secondary | ICD-10-CM | POA: Insufficient documentation

## 2017-06-26 DIAGNOSIS — J181 Lobar pneumonia, unspecified organism: Secondary | ICD-10-CM

## 2017-06-26 DIAGNOSIS — R918 Other nonspecific abnormal finding of lung field: Secondary | ICD-10-CM | POA: Diagnosis not present

## 2017-06-26 DIAGNOSIS — I700 Atherosclerosis of aorta: Secondary | ICD-10-CM | POA: Insufficient documentation

## 2017-06-26 DIAGNOSIS — C9000 Multiple myeloma not having achieved remission: Secondary | ICD-10-CM

## 2017-06-26 DIAGNOSIS — Z7969 Long term (current) use of other immunomodulators and immunosuppressants: Secondary | ICD-10-CM

## 2017-06-26 DIAGNOSIS — M899 Disorder of bone, unspecified: Secondary | ICD-10-CM | POA: Diagnosis not present

## 2017-06-26 DIAGNOSIS — D84821 Immunodeficiency due to drugs: Secondary | ICD-10-CM

## 2017-06-26 MED ORDER — IOPAMIDOL (ISOVUE-300) INJECTION 61%
80.0000 mL | Freq: Once | INTRAVENOUS | Status: AC | PRN
  Administered 2017-06-26: 100 mL via INTRAVENOUS

## 2017-06-26 NOTE — Progress Notes (Signed)
OFFICE VISIT  06/26/2017   CC:  Chief Complaint  Patient presents with  . Pneumonia    follow up   HPI:    Patient is a 77 y.o. Caucasian male who presents for 12 day f/u resp illness. I dx'd him with viral syndrome 12 d/a--Flu test neg, tx symptomatically.  He was not improving any so on 06/15/17 I did CXR and this showed LLL pneumonia. I rx'd him omnicef for 10d. Says he does not feel improved any: still fatigued--and that is his primary problem throught all of this. Has cough that is phlegmy still, tired enough that he doesn't do much activity at all.  Denies wheezing or chest tightness. Worse cough with lying on back, not much on his side.  Getting plenty of sleep but still tired all the time. Eating and drinking fine.   No n/v.  No CP.  No LE swelling.  No PND.  Most recent velcade infusion was about 3 weeks ago.  Past Medical History:  Diagnosis Date  . Alcoholism in remission ([place])    [facility] three times per week as of 01/2017.  Marland Kitchen Bone lesion   . Depression   . Family history of colon cancer in mother   . Hyperlipidemia   . Hypertension   . Insomnia    uses quetiapine for this  . Multiple myeloma ([place])    [facility]  . Nephrolithiasis   . Osteoarthritis    C-spine--hx of fusion surgery.  R foot. Knees (hx of L TKA)  . Prostate cancer [facility])    [facility]    Past Surgical History:  Procedure Laterality Date  . ANTERIOR FUSION CERVICAL SPINE  2002  . autologous stem cell transplant  2010 and 2012   [facility] in [place].  Marland Kitchen COLONOSCOPY  2015   Recall 5 yrs ([facility] in [place], [place])  . EXCISIONAL TOTAL KNEE ARTHROPLASTY  2014 and 2016   Both knees  . LAPAROSCOPIC RETROPUBIC PROSTATECTOMY     + hx of radiation to pelvis.    Outpatient Medications Prior to Visit  Medication Sig Dispense Refill  . acetaminophen (TYLENOL) 325 MG tablet Take 650 mg by mouth every 4 (four) hours as needed.     Marland Kitchen acyclovir  (ZOVIRAX) 400 MG tablet Take 1 tablet (400 mg total) by mouth daily. 90 tablet 3  . amLODipine (NORVASC) 10 MG tablet Take one tablet (10 mg total) by mouth daily. 90 tablet 1  . ammonium lactate (AMLACTIN) 12 % cream Apply at least once daily after bathing and patting dry.  99  . aspirin EC 81 MG tablet Take by mouth.    . bortezomib IV (VELCADE) 3.5 MG injection Inject into the skin.    . cefdinir (OMNICEF) 300 MG capsule Take 1 capsule (300 mg total) by mouth 2 (two) times daily. 20 capsule 0  . clobetasol (TEMOVATE) 0.05 % external solution Apply topically.    Marland Kitchen erythromycin ophthalmic ointment Apply to lashes area of both eyelids qid x 5 days as needed when eyes are significantly more irritated than usual 3.5 g 3  . Fluocinolone Acetonide Body 0.01 % OIL Apply daily to affected areas.  Stop when smooth.    . fluorouracil (EFUDEX) 5 % cream Apply once daily to scalp for two weeks  2  . FLUoxetine (PROZAC) 40 MG capsule Take 1 capsule (40 mg total) by mouth daily. 30 capsule 5  . multivitamin (ONE-A-DAY MEN'S) TABS tablet Take  1 tablet by mouth daily. 30 tablet 1  . ondansetron (ZOFRAN ODT) 4 MG disintegrating tablet Take 1 tablet (4 mg total) by mouth every 8 (eight) hours as needed for nausea or vomiting. 20 tablet 0  . ondansetron (ZOFRAN) 8 MG tablet TAKE 1 TABLET (8 MG TOTAL) BY MOUTH EVERY 8 (EIGHT) HOURS AS NEEDED FOR NAUSEA FOR UP TO 30 DOSES.  3  . QUEtiapine (SEROQUEL) 100 MG tablet Take 1 tablet (100 mg total) by mouth at bedtime. 90 tablet 3  . thiamine (VITAMIN B-1) 100 MG tablet Take 1 tablet (100 mg total) by mouth daily. 30 tablet 1  . zolendronic acid (ZOMETA) 4 MG/5ML injection Inject into the vein.    Marland Kitchen zolpidem (AMBIEN) 10 MG tablet TAKE ONE TABLET BY MOUTH NIGHTLY AS NEEDED FOR SLEEP 30 tablet 5   No facility-administered medications prior to visit.     No Known Allergies  ROS As per HPI  PE: Blood pressure (!) 142/64, pulse 84, temperature 98.3 F (36.8 C),  temperature source Oral, resp. rate 16, weight 242 lb (109.8 kg), SpO2 96 %. Gen: Alert, well appearing.  Patient is oriented to person, place, time, and situation. AFFECT: pleasant, lucid thought and speech. ENT: Ears: EACs clear, normal epithelium.  TMs with good light reflex and landmarks bilaterally.  Eyes: no injection, icteris, swelling, or exudate.  EOMI, PERRLA. Nose: no drainage or turbinate edema/swelling.  No injection or focal lesion.  Mouth: lips without lesion/swelling.  Oral mucosa pink and moist.  Dentition intact and without obvious caries or gingival swelling.  Oropharynx without erythema, exudate, or swelling.  Neck - No masses or thyromegaly or limitation in range of motion CV: RRR, no m/r/g.   LUNGS: CTA bilat, nonlabored resps, good aeration in all lung fields. +Egophony in L base. EXT: no clubbing, cyanosis, or edema.   LABS:  No results found for: TSH Lab Results  Component Value Date   WBC (H) 06/14/2017    10.9 A Manual Differential was performed and is consistent with the Automated Differential.   HGB 11.1 (L) 06/14/2017   HCT 34.0 (L) 06/14/2017   MCV 93.2 06/14/2017   PLT 251.0 06/14/2017   Lab Results  Component Value Date   CREATININE 0.87 06/14/2017   BUN 10 06/14/2017   NA 138 06/14/2017   K 3.9 06/14/2017   CL 96 06/14/2017   CO2 35 (H) 06/14/2017   Lab Results  Component Value Date   ALT 42 06/14/2017   AST 34 06/14/2017   ALKPHOS 53 06/14/2017   BILITOT 0.5 06/14/2017    IMPRESSION AND PLAN:  1) Fatigue--pretty chronic, but worsening last 4 wks or so. Recent mild/various URI type sx's, with CXR showing LLL pneumonia. He has had 10 lb wt loss over the last 12 days. He has failed to improve clinically with antibiotics for his pneumonia.  He is relatively immunosuppressed both from his multiple myeloma and from the Velcade he is on to treat this. Plan: obtain chest CT to r/o worsening infiltrate/effusion or lung mass. If this CT is not  very revealing, will have him return for general lab w/u for fatigue: CBC, CMET, TSH, vit B12, testosterone level, etc.  An After Visit Summary was printed and given to the patient.  FOLLOW UP: Return for to be determined based on result of work up.  Signed:  Crissie Sickles, MD           06/26/2017

## 2017-06-27 ENCOUNTER — Other Ambulatory Visit: Payer: Self-pay | Admitting: Family Medicine

## 2017-06-27 MED ORDER — LEVOFLOXACIN 750 MG PO TABS: 750.0000 mg | ORAL_TABLET | Freq: Every day | ORAL | 0 refills | Status: DC

## 2017-06-28 ENCOUNTER — Telehealth: Payer: Self-pay | Admitting: Family Medicine

## 2017-06-28 NOTE — Telephone Encounter (Signed)
Pt requesting results of chest CT done 06/26/17  faxed ASAP to [facility].  Fax # [phone]  Pt's # [phone]

## 2017-06-28 NOTE — Telephone Encounter (Signed)
CT results faxed with confirmation.

## 2017-07-04 ENCOUNTER — Ambulatory Visit: Payer: Self-pay | Admitting: Family Medicine

## 2017-07-07 ENCOUNTER — Other Ambulatory Visit: Payer: Self-pay | Admitting: Family Medicine

## 2017-07-11 ENCOUNTER — Encounter: Payer: Self-pay | Admitting: Family Medicine

## 2017-07-11 ENCOUNTER — Ambulatory Visit (INDEPENDENT_AMBULATORY_CARE_PROVIDER_SITE_OTHER): Payer: Medicare Other | Admitting: Family Medicine

## 2017-07-11 VITALS — BP 148/72 | HR 70 | Temp 99.0°F | Resp 16 | Ht 69.5 in | Wt 247.2 lb

## 2017-07-11 DIAGNOSIS — J181 Lobar pneumonia, unspecified organism: Secondary | ICD-10-CM

## 2017-07-11 DIAGNOSIS — I10 Essential (primary) hypertension: Secondary | ICD-10-CM | POA: Diagnosis not present

## 2017-07-11 DIAGNOSIS — R030 Elevated blood-pressure reading, without diagnosis of hypertension: Secondary | ICD-10-CM | POA: Diagnosis not present

## 2017-07-11 NOTE — Progress Notes (Signed)
OFFICE VISIT  07/11/2017   CC:  Chief Complaint  Patient presents with  . Follow-up    Pneumonia   HPI:    Patient is a 77 y.o. Caucasian male being treated with Velcaid for multiple myeloma who presents for 2 week f/u lobar pneumonia.   Last visit we did a CT chest b/c of excessive wt loss, fatigue, and lack of clinical resolution of pneumonia despite abx treatment with cefdinir. The CT showed residual pneumonia but no worrisome lesion or other condition. I treated him with another 10d course of abx (levaquin).  He has finished this antibiotic.  "I feel great".  No more coughing.  Not nearly as fatigued. No SOB or hemoptysis.  Past Medical History:  Diagnosis Date  . Alcoholism in remission ([place])    [facility] three times per week as of 01/2017.  Marland Kitchen Bone lesion   . Depression   . Family history of colon cancer in mother   . Hyperlipidemia   . Hypertension   . Insomnia    uses quetiapine for this  . Multiple myeloma ([place])    [facility]  . Nephrolithiasis   . Osteoarthritis    C-spine--hx of fusion surgery.  R foot. Knees (hx of L TKA)  . Prostate cancer [facility])    [facility]    Past Surgical History:  Procedure Laterality Date  . ANTERIOR FUSION CERVICAL SPINE  2002  . autologous stem cell transplant  2010 and 2012   [facility] in [place].  Marland Kitchen COLONOSCOPY  2015   Recall 5 yrs ([facility] in [place], [place])  . EXCISIONAL TOTAL KNEE ARTHROPLASTY  2014 and 2016   Both knees  . LAPAROSCOPIC RETROPUBIC PROSTATECTOMY     + hx of radiation to pelvis.    Outpatient Medications Prior to Visit  Medication Sig Dispense Refill  . acetaminophen (TYLENOL) 325 MG tablet Take 650 mg by mouth every 4 (four) hours as needed.     Marland Kitchen acyclovir (ZOVIRAX) 400 MG tablet Take 1 tablet (400 mg total) by mouth daily. 90 tablet 3  . amLODipine (NORVASC) 10 MG tablet Take one tablet (10 mg total) by mouth daily. 90 tablet 1  . ammonium lactate  (AMLACTIN) 12 % cream Apply at least once daily after bathing and patting dry.  99  . aspirin EC 81 MG tablet Take by mouth.    . bortezomib IV (VELCADE) 3.5 MG injection Inject into the skin.    . clobetasol (TEMOVATE) 0.05 % external solution Apply topically.    . Fluocinolone Acetonide Body 0.01 % OIL Apply daily to affected areas.  Stop when smooth.    . fluorouracil (EFUDEX) 5 % cream Apply once daily to scalp for two weeks  2  . FLUoxetine (PROZAC) 40 MG capsule Take 1 capsule (40 mg total) by mouth daily. 30 capsule 5  . multivitamin (ONE-A-DAY MEN'S) TABS tablet Take 1 tablet by mouth daily. 30 tablet 1  . ondansetron (ZOFRAN ODT) 4 MG disintegrating tablet Take 1 tablet (4 mg total) by mouth every 8 (eight) hours as needed for nausea or vomiting. 20 tablet 0  . ondansetron (ZOFRAN) 8 MG tablet TAKE 1 TABLET (8 MG TOTAL) BY MOUTH EVERY 8 (EIGHT) HOURS AS NEEDED FOR NAUSEA FOR UP TO 30 DOSES.  3  . QUEtiapine (SEROQUEL) 100 MG tablet Take 1 tablet (100 mg total) by mouth at bedtime. 90 tablet 3  . thiamine (VITAMIN B-1) 100 MG tablet Take 1  tablet (100 mg total) by mouth daily. 30 tablet 1  . zolendronic acid (ZOMETA) 4 MG/5ML injection Inject into the vein.    Marland Kitchen zolpidem (AMBIEN) 10 MG tablet TAKE ONE TABLET BY MOUTH NIGHTLY AS NEEDED FOR SLEEP 30 tablet 5  . cefdinir (OMNICEF) 300 MG capsule Take 1 capsule (300 mg total) by mouth 2 (two) times daily. (Patient not taking: Reported on 07/11/2017) 20 capsule 0  . erythromycin ophthalmic ointment Apply to lashes area of both eyelids qid x 5 days as needed when eyes are significantly more irritated than usual (Patient not taking: Reported on 07/11/2017) 3.5 g 3  . levofloxacin (LEVAQUIN) 750 MG tablet Take 1 tablet (750 mg total) by mouth daily. (Patient not taking: Reported on 07/11/2017) 10 tablet 0   No facility-administered medications prior to visit.     No Known Allergies  ROS As per HPI  PE: Initial bp today 150/75.  Repeat manually  was 148/72 Blood pressure (!) 148/72, pulse 70, temperature 99 F (37.2 C), temperature source Oral, resp. rate 16, height 5' 9.5" (1.765 m), weight 247 lb 4 oz (112.2 kg), SpO2 99 %. Gen: Alert, well appearing.  Patient is oriented to person, place, time, and situation. AFFECT: pleasant, lucid thought and speech. CV: RRR, no m/r/g.   LUNGS: CTA bilat, nonlabored resps, good aeration in all lung fields. EXT: no clubbing, cyanosis, or edema.    LABS:  No results found for: TSH    Chemistry      Component Value Date/Time   NA 138 06/14/2017 0844   K 3.9 06/14/2017 0844   CL 96 06/14/2017 0844   CO2 35 (H) 06/14/2017 0844   BUN 10 06/14/2017 0844   CREATININE 0.87 06/14/2017 0844      Component Value Date/Time   CALCIUM 9.1 06/14/2017 0844   ALKPHOS 53 06/14/2017 0844   AST 34 06/14/2017 0844   ALT 42 06/14/2017 0844   BILITOT 0.5 06/14/2017 0844     Lab Results  Component Value Date   WBC (H) 06/14/2017    10.9 A Manual Differential was performed and is consistent with the Automated Differential.   HGB 11.1 (L) 06/14/2017   HCT 34.0 (L) 06/14/2017   MCV 93.2 06/14/2017   PLT 251.0 06/14/2017   No results found for: VITAMINB12 No results found for: TESTOSTERONE   IMPRESSION AND PLAN:  1) Lobar/CAP: clinically resolved. Pt back to normal state of health. High risk for recurrence. Signs/symptoms to call or return for were reviewed and pt expressed understanding.  2) HTN: systolic slightly elevated today.   Encouraged pt to monitor bp at home and call/return if consistently >140/90. Pt expressed understanding and is in agreement with plan. If bp up and more med needed would likely add ARB or ACE-I.  An After Visit Summary was printed and given to the patient.  FOLLOW UP: Return for Keep appt already scheduled for 08/2017..  Signed:  Crissie Sickles, MD           07/11/2017

## 2017-07-11 NOTE — Patient Instructions (Signed)
Check blood pressure every day for 2 weeks: call/return for office visit if top number consistently >140 OR if bottom number consistently >90.

## 2017-07-12 DIAGNOSIS — C9000 Multiple myeloma not having achieved remission: Secondary | ICD-10-CM | POA: Diagnosis not present

## 2017-07-13 ENCOUNTER — Encounter: Payer: Self-pay | Admitting: Family Medicine

## 2017-07-13 ENCOUNTER — Ambulatory Visit: Payer: Self-pay

## 2017-07-13 ENCOUNTER — Ambulatory Visit (INDEPENDENT_AMBULATORY_CARE_PROVIDER_SITE_OTHER): Payer: Medicare Other | Admitting: Family Medicine

## 2017-07-13 VITALS — BP 159/82 | HR 70 | Temp 97.6°F | Wt 249.0 lb

## 2017-07-13 DIAGNOSIS — I10 Essential (primary) hypertension: Secondary | ICD-10-CM | POA: Diagnosis not present

## 2017-07-13 MED ORDER — LISINOPRIL 10 MG PO TABS: 10.0000 mg | ORAL_TABLET | Freq: Every day | ORAL | 1 refills | Status: DC

## 2017-07-13 NOTE — Patient Instructions (Signed)
Buy a new blood pressure machine with an UPPER ARM cuff. Check blood pressure and heart rate 2 times per day for the next 2 weeks and write numbers down so we can review them at next follow up visit in 2 wks.

## 2017-07-13 NOTE — Progress Notes (Signed)
OFFICE VISIT  07/13/2017   CC:  Chief Complaint  Patient presents with  . Hypertension   HPI:    Patient is a 77 y.o. Caucasian male who presents for elevated blood pressures. Of note, last visit here 07/11/17 for f/u pneumonia he had bp 510C/585I systolic, plus a few measurements at other o/v's here this year have been similar.  He has also had quite a few normal bp's here. As of last visit he was instructed to monitor bp at home and call if persistently elevated.  BP's with wrist cuff at home the last 2d: 250s/75.  Checked his wrist cuff against our cuff today and his cuff measured at least 80 points higher systolic. At [facility] yesterday it was 165/74.  Recent abx for pneumonia but no systemic steroids.  No HAs, no dizziness, no focal weakness, no LE swelling, no vision c/o, no palpitations, no CP, no SOB.  Past Medical History:  Diagnosis Date  . Alcoholism in remission ([place])    [facility] three times per week as of 01/2017.  Marland Kitchen Bone lesion   . Depression   . Family history of colon cancer in mother   . Hyperlipidemia   . Hypertension   . Insomnia    uses quetiapine for this  . Multiple myeloma ([place])    [facility]  . Nephrolithiasis   . Osteoarthritis    C-spine--hx of fusion surgery.  R foot. Knees (hx of L TKA)  . Prostate cancer [facility])    [facility]    Past Surgical History:  Procedure Laterality Date  . ANTERIOR FUSION CERVICAL SPINE  2002  . autologous stem cell transplant  2010 and 2012   [facility] in [place].  Marland Kitchen COLONOSCOPY  2015   Recall 5 yrs ([facility] in [place], [place])  . EXCISIONAL TOTAL KNEE ARTHROPLASTY  2014 and 2016   Both knees  . LAPAROSCOPIC RETROPUBIC PROSTATECTOMY     + hx of radiation to pelvis.    Outpatient Medications Prior to Visit  Medication Sig Dispense Refill  . acetaminophen (TYLENOL) 325 MG tablet Take 650 mg by mouth every 4 (four) hours as needed.     Marland Kitchen acyclovir (ZOVIRAX) 400 MG  tablet Take 1 tablet (400 mg total) by mouth daily. 90 tablet 3  . amLODipine (NORVASC) 10 MG tablet Take one tablet (10 mg total) by mouth daily. 90 tablet 1  . ammonium lactate (AMLACTIN) 12 % cream Apply at least once daily after bathing and patting dry.  99  . aspirin EC 81 MG tablet Take by mouth.    . bortezomib IV (VELCADE) 3.5 MG injection Inject into the skin.    . clobetasol (TEMOVATE) 0.05 % external solution Apply topically.    . Fluocinolone Acetonide Body 0.01 % OIL Apply daily to affected areas.  Stop when smooth.    . fluorouracil (EFUDEX) 5 % cream Apply once daily to scalp for two weeks  2  . FLUoxetine (PROZAC) 40 MG capsule Take 1 capsule (40 mg total) by mouth daily. 30 capsule 5  . multivitamin (ONE-A-DAY MEN'S) TABS tablet Take 1 tablet by mouth daily. 30 tablet 1  . ondansetron (ZOFRAN ODT) 4 MG disintegrating tablet Take 1 tablet (4 mg total) by mouth every 8 (eight) hours as needed for nausea or vomiting. 20 tablet 0  . ondansetron (ZOFRAN) 8 MG tablet TAKE 1 TABLET (8 MG TOTAL) BY MOUTH EVERY 8 (EIGHT) HOURS AS NEEDED FOR NAUSEA FOR UP  TO 30 DOSES.  3  . QUEtiapine (SEROQUEL) 100 MG tablet Take 1 tablet (100 mg total) by mouth at bedtime. 90 tablet 3  . thiamine (VITAMIN B-1) 100 MG tablet Take 1 tablet (100 mg total) by mouth daily. 30 tablet 1  . zolendronic acid (ZOMETA) 4 MG/5ML injection Inject into the vein.    Marland Kitchen zolpidem (AMBIEN) 10 MG tablet TAKE ONE TABLET BY MOUTH NIGHTLY AS NEEDED FOR SLEEP 30 tablet 5   No facility-administered medications prior to visit.     No Known Allergies  ROS As per HPI  PE: Blood pressure (!) 159/82, pulse 70, temperature 97.6 F (36.4 C), temperature source Oral, weight 249 lb (112.9 kg), SpO2 98 %. Gen: Alert, well appearing.  Patient is oriented to person, place, time, and situation. AFFECT: pleasant, lucid thought and speech. CV: RRR, no m/r/g.   LUNGS: CTA bilat, nonlabored resps, good aeration in all lung  fields.   LABS:    Chemistry      Component Value Date/Time   NA 138 06/14/2017 0844   K 3.9 06/14/2017 0844   CL 96 06/14/2017 0844   CO2 35 (H) 06/14/2017 0844   BUN 10 06/14/2017 0844   CREATININE 0.87 06/14/2017 0844      Component Value Date/Time   CALCIUM 9.1 06/14/2017 0844   ALKPHOS 53 06/14/2017 0844   AST 34 06/14/2017 0844   ALT 42 06/14/2017 0844   BILITOT 0.5 06/14/2017 0844       IMPRESSION AND PLAN:  Uncontrolled HTN: systolics 315 avg, diastolics 17O. Pt's home bp cuff is NOT READING ACCURATELY. HCTZ not reasonable since pt already has chronic urinary frequency. Will start lisinopril 18m qd today. Continue amlodipine 158mqd. Instructions: Buy a new blood pressure machine with an UPPER ARM cuff. Check blood pressure and heart rate 2 times per day for the next 2 weeks and write numbers down so we can review them at next follow up visit in 2 wks.  Check BMET in 2 wks.  An After Visit Summary was printed and given to the patient.  FOLLOW UP: Return in about 2 weeks (around 07/27/2017) for f/u uncont htn.  Signed:  PhCrissie SicklesMD           07/13/2017

## 2017-07-13 NOTE — Telephone Encounter (Signed)
Rechecked - 195/92 . No other symptoms, but is concerned. BP at appointment at [facility]  165/75. Reason for Disposition . Systolic BP  >= 027 OR Diastolic >= 253  Answer Assessment - Initial Assessment Questions 1. BLOOD PRESSURE: "What is the blood pressure?" "Did you take at least two measurements 5 minutes apart?"     190/74  2. ONSET: "When did you take your blood pressure?"     This morning 3. HOW: "How did you obtain the blood pressure?" (e.g., visiting nurse, automatic home BP monitor)     Home blood pressure 4. HISTORY: "Do you have a history of high blood pressure?"     Yes 5. MEDICATIONS: "Are you taking any medications for blood pressure?" "Have you missed any doses recently?"     Yes - no missed doses. 6. OTHER SYMPTOMS: "Do you have any symptoms?" (e.g., headache, chest pain, blurred vision, difficulty breathing, weakness)     No 7. PREGNANCY: "Is there any chance you are pregnant?" "When was your last menstrual period?"     No  Protocols used: HIGH BLOOD PRESSURE-A-AH

## 2017-07-13 NOTE — Telephone Encounter (Signed)
Noted  

## 2017-07-31 ENCOUNTER — Ambulatory Visit (INDEPENDENT_AMBULATORY_CARE_PROVIDER_SITE_OTHER): Payer: Medicare Other | Admitting: Family Medicine

## 2017-07-31 ENCOUNTER — Encounter: Payer: Self-pay | Admitting: Family Medicine

## 2017-07-31 VITALS — BP 126/76 | HR 67 | Resp 16 | Ht 69.5 in | Wt 249.0 lb

## 2017-07-31 DIAGNOSIS — I10 Essential (primary) hypertension: Secondary | ICD-10-CM | POA: Diagnosis not present

## 2017-07-31 MED ORDER — LISINOPRIL 20 MG PO TABS: 20.0000 mg | ORAL_TABLET | Freq: Every day | ORAL | 1 refills | Status: DC

## 2017-07-31 NOTE — Progress Notes (Signed)
OFFICE VISIT  07/31/2017   CC:  Chief Complaint  Patient presents with  . Hypertension    follow up   HPI:    Patient is a 77 y.o. Caucasian male who presents for 3 wk f/u uncontrolled HTN.  Started lisinopril 59m qd last visit.  No side effects from med. Was question of marked discrepancy between his home wrist cuff measurements and our upper arm cuff here ([facility] He was to get new upper arm cuff and monitor home bp/hr between last visit and today.   [facility]ing bp only at [facility] with [facility] cuff. Reviewed bp's since last visit today: R arm avg 1182systolic, 70 diastolic.  No HR data.  No subclavian steal symptoms.  Past Medical History:  Diagnosis Date  . [place]    [facility] three times per week as of 01/2017.  .Marland KitchenBone lesion   . Depression   . Family history of colon cancer in mother   . Hyperlipidemia   . Hypertension   . Insomnia    uses quetiapine for this  . [place]    [facility]  . Nephrolithiasis   . Osteoarthritis    C-spine--hx of fusion surgery.  R foot. Knees (hx of L TKA)  . Prostate cancer ([facility]    [facility]    Past Surgical History:  Procedure Laterality Date  . ANTERIOR FUSION CERVICAL SPINE  2002  . [facility] stem cell [facility]  2010 [facility] [place]  .Mar[place] KitchenCOLONOSCOPY  2015   [place]  . EXCISIONAL TOTAL KNEE ARTHROPLASTY  2014 and 2016   Both knees  . LAPAROSCOPIC RETROPUBIC PROSTATECTOMY     + hx of radiation to pelvis.    Outpatient Medications Prior to Visit  Medication Sig Dispense Refill  . acetaminophen (TYLENOL) 325 MG tablet Take 650 mg by mouth every 4 (four) hours as needed.     .Marland Kitchenacyclovir (ZOVIRAX) 400 MG tablet Take 1 tablet (400 mg total) by mouth daily. 90 tablet 3  . amLODipine (NORVASC) 10 MG tablet Take one tablet (10 mg total) by mouth daily. 90 tablet 1  .  ammonium lactate (AMLACTIN) 12 % cream Apply at least once daily after bathing and patting dry.  99  . aspirin EC 81 MG tablet Take by mouth.    . bortezomib IV (VELCADE) 3.5 MG injection Inject into the skin.    . clobetasol (TEMOVATE) 0.05 % external solution Apply topically.    . Fluocinolone Acetonide Body 0.01 % OIL Apply daily to affected areas.  Stop when smooth.    . fluorouracil (EFUDEX) 5 % cream Apply once daily to scalp for two weeks  2  . FLUoxetine (PROZAC) 40 MG capsule Take 1 capsule (40 mg total) by mouth daily. 30 capsule 5  . multivitamin (ONE-A-DAY MEN'S) TABS tablet Take 1 tablet by mouth daily. 30 tablet 1  . ondansetron (ZOFRAN ODT) 4 MG disintegrating tablet Take 1 tablet (4 mg total) by mouth every 8 (eight) hours as needed for nausea or vomiting. 20 tablet 0  . ondansetron (ZOFRAN) 8 MG tablet TAKE 1 TABLET (8 MG TOTAL) BY MOUTH EVERY 8 (EIGHT) HOURS AS NEEDED FOR NAUSEA FOR UP TO 30 DOSES.  3  . QUEtiapine (SEROQUEL) 100 MG tablet Take 1 tablet (100 mg total) by mouth at bedtime. 90 tablet 3  . thiamine (VITAMIN  B-1) 100 MG tablet Take 1 tablet (100 mg total) by mouth daily. 30 tablet 1  . zolendronic acid (ZOMETA) 4 MG/5ML injection Inject into the vein.    Marland Kitchen zolpidem (AMBIEN) 10 MG tablet TAKE ONE TABLET BY MOUTH NIGHTLY AS NEEDED FOR SLEEP 30 tablet 5  . lisinopril (PRINIVIL,ZESTRIL) 10 MG tablet Take 1 tablet (10 mg total) by mouth daily. 30 tablet 1   No facility-administered medications prior to visit.     No Known Allergies  ROS As per HPI  PE: Blood pressure 126/76, pulse 67, resp. rate 16, height 5' 9.5" (1.765 m), weight 249 lb (112.9 kg), SpO2 96 %. Gen: Alert, well appearing.  Patient is oriented to person, place, time, and situation. AFFECT: pleasant, lucid thought and speech. Neck: supple/nontender.  No LAD, mass, or TM.  Carotid pulses 2+ bilaterally, without bruits. CV: RRR, soft systolic ejection murmur, no r/g.   LUNGS: CTA bilat, nonlabored  resps, good aeration in all lung fields. No bruit in supra or infra clavicular regions on either side.  Radial pulses 2+/equal bilat.   LABS:    Chemistry      Component Value Date/Time   NA 138 06/14/2017 0844   K 3.9 06/14/2017 0844   CL 96 06/14/2017 0844   CO2 35 (H) 06/14/2017 0844   BUN 10 06/14/2017 0844   CREATININE 0.87 06/14/2017 0844      Component Value Date/Time   CALCIUM 9.1 06/14/2017 0844   ALKPHOS 53 06/14/2017 0844   AST 34 06/14/2017 0844   ALT 42 06/14/2017 0844   BILITOT 0.5 06/14/2017 0844      IMPRESSION AND PLAN:  Uncontrolled HTN: I do not think the higher bp in L arm today is clinically significant. No sign of L subclavian stenosis on exam. Encouraged pt to get home bp cuff for upper arm bp checks but he seems to want to continue checks with wrist cuff at his local pharmacy. Increased lisinopril to 88m qd today.  An After Visit Summary was printed and given to the patient.  FOLLOW UP: Return for keep appt scheduled for 09/04/17.  Signed:  PCrissie Sickles MD           07/31/2017

## 2017-08-02 DIAGNOSIS — C9000 Multiple myeloma not having achieved remission: Secondary | ICD-10-CM | POA: Diagnosis not present

## 2017-08-02 DIAGNOSIS — D485 Neoplasm of uncertain behavior of skin: Secondary | ICD-10-CM | POA: Diagnosis not present

## 2017-08-02 DIAGNOSIS — L570 Actinic keratosis: Secondary | ICD-10-CM | POA: Diagnosis not present

## 2017-08-02 DIAGNOSIS — L219 Seborrheic dermatitis, unspecified: Secondary | ICD-10-CM | POA: Diagnosis not present

## 2017-08-02 DIAGNOSIS — L309 Dermatitis, unspecified: Secondary | ICD-10-CM | POA: Diagnosis not present

## 2017-08-22 ENCOUNTER — Encounter: Payer: Self-pay | Admitting: Family Medicine

## 2017-08-22 DIAGNOSIS — H903 Sensorineural hearing loss, bilateral: Secondary | ICD-10-CM | POA: Diagnosis not present

## 2017-08-23 DIAGNOSIS — Z7982 Long term (current) use of aspirin: Secondary | ICD-10-CM | POA: Diagnosis not present

## 2017-08-23 DIAGNOSIS — Z85828 Personal history of other malignant neoplasm of skin: Secondary | ICD-10-CM | POA: Diagnosis not present

## 2017-08-23 DIAGNOSIS — D485 Neoplasm of uncertain behavior of skin: Secondary | ICD-10-CM | POA: Diagnosis not present

## 2017-08-23 DIAGNOSIS — Z79899 Other long term (current) drug therapy: Secondary | ICD-10-CM | POA: Diagnosis not present

## 2017-08-23 DIAGNOSIS — C9000 Multiple myeloma not having achieved remission: Secondary | ICD-10-CM | POA: Diagnosis not present

## 2017-08-23 DIAGNOSIS — L570 Actinic keratosis: Secondary | ICD-10-CM | POA: Diagnosis not present

## 2017-08-23 DIAGNOSIS — Z5111 Encounter for antineoplastic chemotherapy: Secondary | ICD-10-CM | POA: Diagnosis not present

## 2017-08-30 ENCOUNTER — Ambulatory Visit (INDEPENDENT_AMBULATORY_CARE_PROVIDER_SITE_OTHER): Payer: Medicare Other | Admitting: Family Medicine

## 2017-08-30 ENCOUNTER — Encounter: Payer: Self-pay | Admitting: Family Medicine

## 2017-08-30 VITALS — BP 117/65 | HR 67 | Temp 99.0°F | Resp 16 | Ht 69.5 in | Wt 249.2 lb

## 2017-08-30 DIAGNOSIS — B9789 Other viral agents as the cause of diseases classified elsewhere: Secondary | ICD-10-CM

## 2017-08-30 DIAGNOSIS — J069 Acute upper respiratory infection, unspecified: Secondary | ICD-10-CM

## 2017-08-30 DIAGNOSIS — Z8701 Personal history of pneumonia (recurrent): Secondary | ICD-10-CM

## 2017-08-30 MED ORDER — AMOXICILLIN 875 MG PO TABS: 875.0000 mg | ORAL_TABLET | Freq: Two times a day (BID) | ORAL | 0 refills | Status: AC

## 2017-08-30 NOTE — Progress Notes (Signed)
OFFICE VISIT  08/30/2017   CC:  Chief Complaint  Patient presents with  . URI    HPI:    Patient is a 77 y.o. Caucasian male who presents for respiratory symptoms.  Onset 2 d/a. Runny nose, sneezing, ST off and on, minimal cough.  No HA, no face pain, no fever. Mucinex taken.  No wheezing, SOB, or body aches.  No n/v/d or rash.   Past Medical History:  Diagnosis Date  . Alcoholism in remission ([place])    [facility] three times per week as of 01/2017.  Marland Kitchen Bone lesion   . Depression   . Family history of colon cancer in mother   . Hyperlipidemia   . Hypertension   . Insomnia    uses quetiapine for this  . Multiple myeloma ([place])    [facility]  . Nephrolithiasis   . Osteoarthritis    C-spine--hx of fusion surgery.  R foot. Knees (hx of L TKA)  . Prostate cancer [facility])    [facility]    Past Surgical History:  Procedure Laterality Date  . ANTERIOR FUSION CERVICAL SPINE  2002  . autologous stem cell transplant  2010 and 2012   [facility] in [place].  Marland Kitchen COLONOSCOPY  2015   Recall 5 yrs ([facility] in [place], [place])  . EXCISIONAL TOTAL KNEE ARTHROPLASTY  2014 and 2016   Both knees  . LAPAROSCOPIC RETROPUBIC PROSTATECTOMY     + hx of radiation to pelvis.    Outpatient Medications Prior to Visit  Medication Sig Dispense Refill  . acetaminophen (TYLENOL) 325 MG tablet Take 650 mg by mouth every 4 (four) hours as needed.     Marland Kitchen acyclovir (ZOVIRAX) 400 MG tablet Take 1 tablet (400 mg total) by mouth daily. 90 tablet 3  . amLODipine (NORVASC) 10 MG tablet Take one tablet (10 mg total) by mouth daily. 90 tablet 1  . ammonium lactate (AMLACTIN) 12 % cream Apply at least once daily after bathing and patting dry.  99  . aspirin EC 81 MG tablet Take by mouth.    . bortezomib IV (VELCADE) 3.5 MG injection Inject into the skin.    . clobetasol (TEMOVATE) 0.05 % external solution Apply topically.    . Fluocinolone Acetonide Body 0.01 % OIL  Apply daily to affected areas.  Stop when smooth.    . fluorouracil (EFUDEX) 5 % cream Apply once daily to scalp for two weeks  2  . FLUoxetine (PROZAC) 40 MG capsule Take 1 capsule (40 mg total) by mouth daily. 30 capsule 5  . lisinopril (PRINIVIL,ZESTRIL) 20 MG tablet Take 1 tablet (20 mg total) by mouth daily. 30 tablet 1  . multivitamin (ONE-A-DAY MEN'S) TABS tablet Take 1 tablet by mouth daily. 30 tablet 1  . ondansetron (ZOFRAN ODT) 4 MG disintegrating tablet Take 1 tablet (4 mg total) by mouth every 8 (eight) hours as needed for nausea or vomiting. 20 tablet 0  . ondansetron (ZOFRAN) 8 MG tablet TAKE 1 TABLET (8 MG TOTAL) BY MOUTH EVERY 8 (EIGHT) HOURS AS NEEDED FOR NAUSEA FOR UP TO 30 DOSES.  3  . QUEtiapine (SEROQUEL) 100 MG tablet Take 1 tablet (100 mg total) by mouth at bedtime. 90 tablet 3  . zolendronic acid (ZOMETA) 4 MG/5ML injection Inject into the vein.    Marland Kitchen zolpidem (AMBIEN) 10 MG tablet TAKE ONE TABLET BY MOUTH NIGHTLY AS NEEDED FOR SLEEP 30 tablet 5  . thiamine (VITAMIN B-1) 100  MG tablet Take 1 tablet (100 mg total) by mouth daily. 30 tablet 1   No facility-administered medications prior to visit.     No Known Allergies  ROS As per HPI  PE: Blood pressure 117/65, pulse 67, temperature 99 F (37.2 C), temperature source Oral, resp. rate 16, height 5' 9.5" (1.765 m), weight 249 lb 4 oz (113.1 kg), SpO2 98 %. VS: noted--normal. Gen: alert, NAD, NONTOXIC APPEARING. HEENT: eyes without injection, drainage, or swelling.  Ears: EACs clear, TMs with normal light reflex and landmarks.  Nose: Clear rhinorrhea, with some dried, crusty exudate adherent to mildly injected mucosa.  No purulent d/c.  No paranasal sinus TTP.  No facial swelling.  Throat and mouth without focal lesion.  No pharyngial swelling, erythema, or exudate.   Neck: supple, no LAD.   LUNGS: CTA bilat, nonlabored resps.   CV: RRR, no m/r/g. EXT: no c/c/e SKIN: no rash  LABS:  none  IMPRESSION AND  PLAN:  Viral URI with cough. He is very scared that every time he gets a URI he "can't kick it" and it "turns into pneumonia". We discussed the potential inappropriate use of antibiotics in respiratory illnesses as well as the lack of ability to predict which of them may progress to pneumonia.   We agreed on a compromise: I rx'd amoxil 832m bid x 10d that he will fill in 3d IF HE IS NOT FEELING ANY IMPROVEMENT AT THAT TIME. Signs/symptoms to call or return for were reviewed and pt expressed understanding.  An After Visit Summary was printed and given to the patient.  FOLLOW UP: Return if symptoms worsen or fail to improve.  Signed:  PCrissie Sickles MD           08/30/2017

## 2017-08-31 ENCOUNTER — Other Ambulatory Visit: Payer: Self-pay | Admitting: Family Medicine

## 2017-09-04 ENCOUNTER — Ambulatory Visit: Payer: Self-pay | Admitting: Family Medicine

## 2017-09-08 ENCOUNTER — Encounter: Payer: Self-pay | Admitting: Family Medicine

## 2017-09-08 ENCOUNTER — Ambulatory Visit (INDEPENDENT_AMBULATORY_CARE_PROVIDER_SITE_OTHER): Payer: Medicare Other | Admitting: Family Medicine

## 2017-09-08 VITALS — BP 153/67 | HR 61 | Temp 98.5°F | Resp 16 | Ht 69.5 in | Wt 245.1 lb

## 2017-09-08 DIAGNOSIS — I10 Essential (primary) hypertension: Secondary | ICD-10-CM | POA: Diagnosis not present

## 2017-09-08 DIAGNOSIS — F1011 Alcohol abuse, in remission: Secondary | ICD-10-CM | POA: Diagnosis not present

## 2017-09-08 DIAGNOSIS — F5101 Primary insomnia: Secondary | ICD-10-CM | POA: Diagnosis not present

## 2017-09-08 DIAGNOSIS — F3342 Major depressive disorder, recurrent, in full remission: Secondary | ICD-10-CM | POA: Diagnosis not present

## 2017-09-08 DIAGNOSIS — Z79899 Other long term (current) drug therapy: Secondary | ICD-10-CM

## 2017-09-08 DIAGNOSIS — E7800 Pure hypercholesterolemia, unspecified: Secondary | ICD-10-CM

## 2017-09-08 LAB — COMPREHENSIVE METABOLIC PANEL
ALT: 13 U/L (ref 0–53)
AST: 18 U/L (ref 0–37)
Albumin: 4.3 g/dL (ref 3.5–5.2)
Alkaline Phosphatase: 58 U/L (ref 39–117)
BILIRUBIN TOTAL: 0.4 mg/dL (ref 0.2–1.2)
BUN: 25 mg/dL — AB (ref 6–23)
CALCIUM: 9.4 mg/dL (ref 8.4–10.5)
CHLORIDE: 101 meq/L (ref 96–112)
CO2: 30 meq/L (ref 19–32)
CREATININE: 0.97 mg/dL (ref 0.40–1.50)
GFR: 80.11 mL/min (ref >60.00)
GLUCOSE: 99 mg/dL (ref 70–99)
Potassium: 4.7 mEq/L (ref 3.5–5.1)
SODIUM: 137 meq/L (ref 135–145)
Total Protein: 6.8 g/dL (ref 6.0–8.3)

## 2017-09-08 LAB — LIPID PANEL
CHOL/HDL RATIO: 8
Cholesterol: 255 mg/dL — ABNORMAL HIGH (ref 0–200)
HDL: 33.4 mg/dL — ABNORMAL LOW (ref >39.00)
LDL CALC: 184 mg/dL — AB (ref 0–99)
NonHDL: 221.87
TRIGLYCERIDES: 189 mg/dL — AB (ref 0.0–149.0)
VLDL: 37.8 mg/dL (ref 0.0–40.0)

## 2017-09-08 LAB — HEMOGLOBIN A1C: Hgb A1c MFr Bld: 6 % (ref 4.6–6.5)

## 2017-09-08 MED ORDER — LISINOPRIL 30 MG PO TABS: 30.0000 mg | ORAL_TABLET | Freq: Every day | ORAL | 1 refills | Status: DC

## 2017-09-08 NOTE — Progress Notes (Signed)
OFFICE VISIT  09/08/2017   CC:  Chief Complaint  Patient presents with  . Follow-up    RCI, pt is fasting.    HPI:    Patient is a 77 y.o. Caucasian male who presents for f/u HTN, insomnia, and MDD with anger control problems and hx of alcoholism (in remission). He is treated for multiple myeloma at [facility] currently.  49th wedding anniversary yesterday.  HTN: home measurement avg 140s/70s.  No med taken yet this morning, otherwise compliant with meds. Exercising with yardwork.  Insomnia: has been on quetiapine relatively long term for insomnia.  Stable. Discussed need for monitoring for metabolic changes that could be assoc with this med--lipids, glucose.  MDD: doing well with mood, no more excessive irritability and anger, interacting with wife much better. He is not drinking any alcohol.  Appetite good.  Past Medical History:  Diagnosis Date  . Alcoholism in remission ([place])    [facility] three times per week as of 01/2017.  Marland Kitchen Bone lesion   . Depression   . Family history of colon cancer in mother   . Hyperlipidemia   . Hypertension   . Insomnia    uses quetiapine for this  . Multiple myeloma ([place])    [facility]  . Nephrolithiasis   . Osteoarthritis    C-spine--hx of fusion surgery.  R foot. Knees (hx of L TKA)  . Prostate cancer [facility])    [facility]    Past Surgical History:  Procedure Laterality Date  . ANTERIOR FUSION CERVICAL SPINE  2002  . autologous stem cell transplant  2010 and 2012   [facility] in [place].  Marland Kitchen COLONOSCOPY  2015   Recall 5 yrs ([facility] in [place], [place])  . EXCISIONAL TOTAL KNEE ARTHROPLASTY  2014 and 2016   Both knees  . LAPAROSCOPIC RETROPUBIC PROSTATECTOMY     + hx of radiation to pelvis.    Outpatient Medications Prior to Visit  Medication Sig Dispense Refill  . acetaminophen (TYLENOL) 325 MG tablet Take 650 mg by mouth every 4 (four) hours as needed.     Marland Kitchen acyclovir (ZOVIRAX) 400  MG tablet Take 1 tablet (400 mg total) by mouth daily. 90 tablet 3  . amLODipine (NORVASC) 10 MG tablet Take one tablet (10 mg total) by mouth daily. 90 tablet 1  . ammonium lactate (AMLACTIN) 12 % cream Apply at least once daily after bathing and patting dry.  99  . amoxicillin (AMOXIL) 875 MG tablet Take 1 tablet (875 mg total) by mouth 2 (two) times daily for 10 days. 20 tablet 0  . aspirin EC 81 MG tablet Take by mouth.    . bortezomib IV (VELCADE) 3.5 MG injection Inject into the skin.    . clobetasol (TEMOVATE) 0.05 % external solution Apply topically.    . Fluocinolone Acetonide Body 0.01 % OIL Apply daily to affected areas.  Stop when smooth.    . fluorouracil (EFUDEX) 5 % cream Apply once daily to scalp for two weeks  2  . FLUoxetine (PROZAC) 40 MG capsule Take 1 capsule (40 mg total) by mouth daily. 30 capsule 5  . multivitamin (ONE-A-DAY MEN'S) TABS tablet Take 1 tablet by mouth daily. 30 tablet 1  . ondansetron (ZOFRAN ODT) 4 MG disintegrating tablet Take 1 tablet (4 mg total) by mouth every 8 (eight) hours as needed for nausea or vomiting. 20 tablet 0  . ondansetron (ZOFRAN) 8 MG tablet TAKE 1 TABLET (8 MG  TOTAL) BY MOUTH EVERY 8 (EIGHT) HOURS AS NEEDED FOR NAUSEA FOR UP TO 30 DOSES.  3  . QUEtiapine (SEROQUEL) 100 MG tablet Take 1 tablet (100 mg total) by mouth at bedtime. 90 tablet 3  . thiamine (VITAMIN B-1) 100 MG tablet Take 1 tablet (100 mg total) by mouth daily. 30 tablet 1  . zolendronic acid (ZOMETA) 4 MG/5ML injection Inject into the vein.    Marland Kitchen zolpidem (AMBIEN) 10 MG tablet TAKE ONE TABLET BY MOUTH NIGHTLY AS NEEDED FOR SLEEP 30 tablet 5  . lisinopril (PRINIVIL,ZESTRIL) 20 MG tablet Take 1 tablet (20 mg total) by mouth daily. 30 tablet 1   No facility-administered medications prior to visit.     No Known Allergies  ROS As per HPI  PE: Blood pressure (!) 153/67, pulse 61, temperature 98.5 F (36.9 C), temperature source Oral, resp. rate 16, height 5' 9.5" (1.765  m), weight 245 lb 2 oz (111.2 kg), SpO2 99 %. Gen: Alert, well appearing.  Patient is oriented to person, place, time, and situation. AFFECT: pleasant, lucid thought and speech. CV: RRR, no m/r/g.   LUNGS: CTA bilat, nonlabored resps, good aeration in all lung fields. EXT: no clubbing, cyanosis, or edema.    LABS:  No results found for: TSH Lab Results  Component Value Date   WBC (H) 06/14/2017    10.9 A Manual Differential was performed and is consistent with the Automated Differential.   HGB 11.1 (L) 06/14/2017   HCT 34.0 (L) 06/14/2017   MCV 93.2 06/14/2017   PLT 251.0 06/14/2017   Lab Results  Component Value Date   CREATININE 0.87 06/14/2017   BUN 10 06/14/2017   NA 138 06/14/2017   K 3.9 06/14/2017   CL 96 06/14/2017   CO2 35 (H) 06/14/2017   Lab Results  Component Value Date   ALT 42 06/14/2017   AST 34 06/14/2017   ALKPHOS 53 06/14/2017   BILITOT 0.5 06/14/2017   No results found for: CHOL No results found for: HDL No results found for: LDLCALC No results found for: TRIG No results found for: CHOLHDL No results found for: PSA  No results found for: HGBA1C  IMPRESSION AND PLAN:  1) Insomnia, with chronic use of quetiapine for this (high risk for metabolic syndrome). A1c, CMET, and FLP today. This problem is stable.  2) MDD, recurrent: essentially in remission since relatively recent increase in fluoxetine to 62m qd dosing.  3) HTN, not ideal control.  Increase lisinopril to 338mqd. Continue home bp monitoring.  4) Hx of alcoholism: he remains in long term remission.  An After Visit Summary was printed and given to the patient.  FOLLOW UP: Return in about 3 months (around 12/08/2017) for routine chronic illness f/u.  Signed:  PhCrissie SicklesMD           09/08/2017

## 2017-09-11 ENCOUNTER — Telehealth: Payer: Self-pay | Admitting: Family Medicine

## 2017-09-11 ENCOUNTER — Encounter: Payer: Self-pay | Admitting: Family Medicine

## 2017-09-11 ENCOUNTER — Other Ambulatory Visit: Payer: Self-pay | Admitting: *Deleted

## 2017-09-11 DIAGNOSIS — E7800 Pure hypercholesterolemia, unspecified: Secondary | ICD-10-CM

## 2017-09-11 MED ORDER — ATORVASTATIN CALCIUM 20 MG PO TABS: 20.0000 mg | ORAL_TABLET | Freq: Every day | ORAL | 2 refills | Status: DC

## 2017-09-11 NOTE — Telephone Encounter (Signed)
FYI

## 2017-09-11 NOTE — Telephone Encounter (Signed)
Copied from [place] [id number]. Topic: Quick Communication - See Telephone Encounter >> Sep 11, 2017  3:11 PM Arletha Grippe wrote: CRM for notification. See Telephone encounter for: 09/11/17. Pt states that he is not going to take the cholesterol medication that was recommended. Pt is going to do mediterranean  diet.  Cb is [phone]

## 2017-09-11 NOTE — Telephone Encounter (Signed)
Duplicate message, see also message from pt that was sent to Dr. Anitra Lauth.

## 2017-09-11 NOTE — Telephone Encounter (Signed)
Okay, noted

## 2017-09-11 NOTE — Telephone Encounter (Signed)
Copied from [place] [phone]. Topic: Quick Communication - See Telephone Encounter >> Sep 11, 2017  2:55 PM Robina Ade, Helene Kelp D wrote: CRM for notification. See Telephone encounter for: 09/11/17. April with [facility] called and said that patient is not going to start taking atorvastatin (LIPITOR) 20 MG tablet, he refuse to get it and she wanted to let Dr. Anitra Lauth know.

## 2017-09-14 DIAGNOSIS — M2011 Hallux valgus (acquired), right foot: Secondary | ICD-10-CM | POA: Diagnosis not present

## 2017-09-14 DIAGNOSIS — Z5111 Encounter for antineoplastic chemotherapy: Secondary | ICD-10-CM | POA: Diagnosis not present

## 2017-09-14 DIAGNOSIS — C9000 Multiple myeloma not having achieved remission: Secondary | ICD-10-CM | POA: Diagnosis not present

## 2017-09-14 DIAGNOSIS — M79671 Pain in right foot: Secondary | ICD-10-CM | POA: Diagnosis not present

## 2017-09-14 DIAGNOSIS — Z923 Personal history of irradiation: Secondary | ICD-10-CM | POA: Diagnosis not present

## 2017-09-14 DIAGNOSIS — Z79899 Other long term (current) drug therapy: Secondary | ICD-10-CM | POA: Diagnosis not present

## 2017-09-14 DIAGNOSIS — Z8546 Personal history of malignant neoplasm of prostate: Secondary | ICD-10-CM | POA: Diagnosis not present

## 2017-09-27 DIAGNOSIS — C9000 Multiple myeloma not having achieved remission: Secondary | ICD-10-CM | POA: Diagnosis not present

## 2017-09-29 ENCOUNTER — Other Ambulatory Visit: Payer: Self-pay | Admitting: Family Medicine

## 2017-10-11 DIAGNOSIS — C9000 Multiple myeloma not having achieved remission: Secondary | ICD-10-CM | POA: Diagnosis not present

## 2017-10-13 DIAGNOSIS — M47816 Spondylosis without myelopathy or radiculopathy, lumbar region: Secondary | ICD-10-CM | POA: Diagnosis not present

## 2017-10-13 DIAGNOSIS — C9000 Multiple myeloma not having achieved remission: Secondary | ICD-10-CM | POA: Diagnosis not present

## 2017-10-13 DIAGNOSIS — M8448XA Pathological fracture, other site, initial encounter for fracture: Secondary | ICD-10-CM | POA: Diagnosis not present

## 2017-10-24 ENCOUNTER — Other Ambulatory Visit: Payer: Self-pay | Admitting: Family Medicine

## 2017-11-01 DIAGNOSIS — C9000 Multiple myeloma not having achieved remission: Secondary | ICD-10-CM | POA: Diagnosis not present

## 2017-11-09 ENCOUNTER — Encounter: Payer: Self-pay | Admitting: Family Medicine

## 2017-11-09 ENCOUNTER — Ambulatory Visit (INDEPENDENT_AMBULATORY_CARE_PROVIDER_SITE_OTHER): Payer: Medicare Other | Admitting: Family Medicine

## 2017-11-09 VITALS — BP 130/69 | HR 68 | Temp 99.0°F | Resp 16 | Ht 69.5 in | Wt 249.2 lb

## 2017-11-09 DIAGNOSIS — L600 Ingrowing nail: Secondary | ICD-10-CM | POA: Diagnosis not present

## 2017-11-09 DIAGNOSIS — L089 Local infection of the skin and subcutaneous tissue, unspecified: Secondary | ICD-10-CM

## 2017-11-09 DIAGNOSIS — H61892 Other specified disorders of left external ear: Secondary | ICD-10-CM

## 2017-11-09 MED ORDER — AMOXICILLIN-POT CLAVULANATE 875-125 MG PO TABS: 1.0000 | ORAL_TABLET | Freq: Two times a day (BID) | ORAL | 0 refills | Status: DC

## 2017-11-09 NOTE — Patient Instructions (Signed)
Buy a Sitz bath at any pharmacy--fill with epsom salt+ warm water and soak your buttocks region in it for 20-30 min once a day.  Continue to soak your left big toe for 20-30 min a day.

## 2017-11-09 NOTE — Progress Notes (Signed)
OFFICE VISIT  11/09/2017   CC:  Chief Complaint  Patient presents with  . Toe Pain    left great  . Skin lesion    on left ear and inflamed cyst in groin area   HPI:    Patient is a 77 y.o. Caucasian male who presents for a toe complaint and an ear complaint.  TOE: left great toe pain x 3 mo, medial aspect of nail border mildly red/pink and painful.  Toe nail appears normal. No swelling.  He has been soaking it regularly.  Not taking any tylenol or NSAIDs.  EAR: left ear has something in EAC, sensitive to touch.  Has been there a year and getting more sensitive and larger. Bled once after he stuck his finger in EAC to mess with it, otherwise no bleeding.  No hearing imp in the ear.  Left groin: bottom of scrotum with "cyst"--he first noted it about 1 week ago, irritates him.  ROS: no fever/chills/malaise.  No HAs, no penile d/c, no groin swelling.  Past Medical History:  Diagnosis Date  . Alcoholism in remission ([place])    [facility] three times per week as of 01/2017.  Marland Kitchen Bone lesion   . Depression   . Family history of colon cancer in mother   . Hyperlipidemia    Pt declined statin 08/2017  . Hypertension   . Insomnia    uses quetiapine for this  . Multiple myeloma ([place])    [facility]  . Nephrolithiasis   . Osteoarthritis    C-spine--hx of fusion surgery.  R foot. Knees (hx of L TKA)  . Prostate cancer [facility])    [facility]    Past Surgical History:  Procedure Laterality Date  . ANTERIOR FUSION CERVICAL SPINE  2002  . autologous stem cell transplant  2010 and 2012   [facility] in [place].  Marland Kitchen COLONOSCOPY  2015   Recall 5 yrs ([facility] in [place], [place])  . EXCISIONAL TOTAL KNEE ARTHROPLASTY  2014 and 2016   Both knees  . LAPAROSCOPIC RETROPUBIC PROSTATECTOMY     + hx of radiation to pelvis.    Outpatient Medications Prior to Visit  Medication Sig Dispense Refill  . acetaminophen (TYLENOL) 325 MG tablet Take 650  mg by mouth every 4 (four) hours as needed.     Marland Kitchen acyclovir (ZOVIRAX) 400 MG tablet Take 1 tablet (400 mg total) by mouth daily. 90 tablet 3  . amLODipine (NORVASC) 10 MG tablet Take one tablet (10 mg total) by mouth daily. 90 tablet 1  . ammonium lactate (AMLACTIN) 12 % cream Apply at least once daily after bathing and patting dry.  99  . aspirin EC 81 MG tablet Take by mouth.    Marland Kitchen atorvastatin (LIPITOR) 20 MG tablet Take 1 tablet (20 mg total) by mouth daily. 30 tablet 2  . bortezomib IV (VELCADE) 3.5 MG injection Inject into the skin.    . clobetasol (TEMOVATE) 0.05 % external solution Apply topically.    . Fluocinolone Acetonide Body 0.01 % OIL Apply daily to affected areas.  Stop when smooth.    . fluorouracil (EFUDEX) 5 % cream Apply once daily to scalp for two weeks  2  . FLUoxetine (PROZAC) 40 MG capsule Take 1 capsule (40 mg total) by mouth daily. 30 capsule 5  . lisinopril (PRINIVIL,ZESTRIL) 30 MG tablet Take 1 tablet (30 mg total) by mouth daily. 90 tablet 1  . multivitamin (ONE-A-DAY MEN'S) TABS  tablet Take 1 tablet by mouth daily. 30 tablet 1  . ondansetron (ZOFRAN ODT) 4 MG disintegrating tablet Take 1 tablet (4 mg total) by mouth every 8 (eight) hours as needed for nausea or vomiting. 20 tablet 0  . ondansetron (ZOFRAN) 8 MG tablet TAKE 1 TABLET (8 MG TOTAL) BY MOUTH EVERY 8 (EIGHT) HOURS AS NEEDED FOR NAUSEA FOR UP TO 30 DOSES.  3  . QUEtiapine (SEROQUEL) 100 MG tablet Take 1 tablet (100 mg total) by mouth at bedtime. 90 tablet 1  . thiamine (VITAMIN B-1) 100 MG tablet Take 1 tablet (100 mg total) by mouth daily. 30 tablet 1  . zolendronic acid (ZOMETA) 4 MG/5ML injection Inject into the vein.    Marland Kitchen zolpidem (AMBIEN) 10 MG tablet TAKE ONE TABLET BY MOUTH NIGHTLY AS NEEDED FOR SLEEP 30 tablet 5   No facility-administered medications prior to visit.     No Known Allergies  ROS As per HPI  PE: Blood pressure 130/69, pulse 68, temperature 99 F (37.2 C), temperature source  Oral, resp. rate 16, height 5' 9.5" (1.765 m), weight 249 lb 4 oz (113.1 kg), SpO2 97 %. Body mass index is 36.28 kg/m.  Gen: Alert, well appearing.  Patient is oriented to person, place, time, and situation. AFFECT: pleasant, lucid thought and speech. Left great toe: mild pinkish hue (blanchable) to medial nail fold, with no swelling or exudate. TTP over soft tissue at medial corner of nail (distally).  Nail appears normal. Left EAC with 12m x 2 mmx 253mflesh colored to mildly pink papule, with slight dry ulceration of center/top, scant amount of drid blood around base of lesion.  No erythema, no dark pigment, no active bleeding.  No soft tissue swelling. Perineum with 2 small pustules, w/out signif erythema and w/out sub Q nodularity or fluctuance.  Minimal discomfort to palpation.  No drainage.    LABS:  No results found for: LABURIC  IMPRESSION AND PLAN:  No problem-specific Assessment & Plan notes found for this encounter.    Chemistry      Component Value Date/Time   NA 137 09/08/2017 1023   K 4.7 09/08/2017 1023   CL 101 09/08/2017 1023   CO2 30 09/08/2017 1023   BUN 25 (H) 09/08/2017 1023   CREATININE 0.97 09/08/2017 1023      Component Value Date/Time   CALCIUM 9.4 09/08/2017 1023   ALKPHOS 58 09/08/2017 1023   AST 18 09/08/2017 1023   ALT 13 09/08/2017 1023   BILITOT 0.4 09/08/2017 1023      FOLLOW UP:  1) Left great toe, ingrown toenail, failed 3 mo conservative mgmt. Will start augmentin 875 bid x 10d, continue soaking 20-30 min qd, return in 7-10d with plan to remove medial 1/2 of toenail (per pt preference) unless all symptoms resolved.  2) Left EAC nodule: c/w pyogenic granuloma.  Very slow growth over the last 1 yr or so. Watchful waiting approach as long as he is asymptomatic.  3) Small furuncle/pustule in perineum. Sitz bath 20-30 min qd, augmentin 87574mid x 10d.  An After Visit Summary was printed and given to the patient.   Return for f/u 7-10  days for 30 min appt to remove toenail.  Signed:  PhiCrissie SicklesD           11/09/2017

## 2017-11-15 DIAGNOSIS — C61 Malignant neoplasm of prostate: Secondary | ICD-10-CM | POA: Diagnosis not present

## 2017-11-15 DIAGNOSIS — N529 Male erectile dysfunction, unspecified: Secondary | ICD-10-CM | POA: Diagnosis not present

## 2017-11-15 DIAGNOSIS — Z79818 Long term (current) use of other agents affecting estrogen receptors and estrogen levels: Secondary | ICD-10-CM | POA: Diagnosis not present

## 2017-11-17 ENCOUNTER — Other Ambulatory Visit: Payer: Self-pay | Admitting: Family Medicine

## 2017-11-17 DIAGNOSIS — C9000 Multiple myeloma not having achieved remission: Secondary | ICD-10-CM | POA: Diagnosis not present

## 2017-11-17 NOTE — Telephone Encounter (Signed)
RF request for zolpidem LOV: 09/08/17 Next ov: 11/20/17 Last written: 03/09/17 #30 w/ 5RF  Please advise. Thanks.

## 2017-11-20 ENCOUNTER — Ambulatory Visit: Payer: Self-pay | Admitting: Family Medicine

## 2017-11-27 DIAGNOSIS — M546 Pain in thoracic spine: Secondary | ICD-10-CM | POA: Diagnosis not present

## 2017-12-04 DIAGNOSIS — C9000 Multiple myeloma not having achieved remission: Secondary | ICD-10-CM | POA: Diagnosis not present

## 2017-12-04 DIAGNOSIS — M5136 Other intervertebral disc degeneration, lumbar region: Secondary | ICD-10-CM | POA: Diagnosis not present

## 2017-12-04 DIAGNOSIS — Z79899 Other long term (current) drug therapy: Secondary | ICD-10-CM | POA: Diagnosis not present

## 2017-12-04 DIAGNOSIS — Z5111 Encounter for antineoplastic chemotherapy: Secondary | ICD-10-CM | POA: Diagnosis not present

## 2017-12-04 DIAGNOSIS — Z87891 Personal history of nicotine dependence: Secondary | ICD-10-CM | POA: Diagnosis not present

## 2017-12-04 DIAGNOSIS — M47816 Spondylosis without myelopathy or radiculopathy, lumbar region: Secondary | ICD-10-CM | POA: Diagnosis not present

## 2017-12-05 DIAGNOSIS — M546 Pain in thoracic spine: Secondary | ICD-10-CM | POA: Diagnosis not present

## 2017-12-12 ENCOUNTER — Other Ambulatory Visit: Payer: Self-pay | Admitting: Family Medicine

## 2017-12-19 ENCOUNTER — Ambulatory Visit: Payer: Medicare Other | Admitting: Family Medicine

## 2017-12-21 DIAGNOSIS — C9000 Multiple myeloma not having achieved remission: Secondary | ICD-10-CM | POA: Diagnosis not present

## 2017-12-22 ENCOUNTER — Ambulatory Visit: Payer: Medicare Other | Admitting: Family Medicine

## 2017-12-22 DIAGNOSIS — Z0289 Encounter for other administrative examinations: Secondary | ICD-10-CM

## 2017-12-22 NOTE — Progress Notes (Deleted)
OFFICE VISIT  12/22/2017   CC: No chief complaint on file.    HPI:    Patient is a 77 y.o. Caucasian male who presents for 3 mo f/u HTN, HLD, MDD. Pt continues to get Velcade injections for treatment of his multiple myeloma [facility]). He got most recent labs and velcade dose on 11/01/17.  Lab results at that time(CBC w/diff and CMET) were all normal. Complete Blood Count (CBC) with Differential  Result Value Ref Range  WBC (White Blood Cell Count) 4.6 3.2 - 9.8 x10^9/L  Hemoglobin 12.0 (L) 13.7 - 17.3 g/dL  Hematocrit 36.5 (L) 39.0 - 49.0 %  Platelets 183 150 - 450 x10^9/L  MCV (Mean Corpuscular Volume) 92 80 - 98 fL  MCH (Mean Corpuscular Hemoglobin) 30.2 26.5 - 34.0 pg  MCHC (Mean Corpuscular Hemoglobin Concentration) 32.9 31.5 - 36.3 %  RBC (Red Blood Cell Count) 3.97 (L) 4.37 - 5.74 x10^12/L  RDW-CV (Red Cell Distribution Width) 14.2 11.5 - 14.5 %  NRBC (Nucleated Red Blood Cell Count) 0.00 0 x10^9/L  NRBC % (Nucleated Red Blood Cell %) 0.0 %  MPV (Mean Platelet Volume) 9.2 7.2 - 11.7 fL  Neutrophil Count 3.2 2.0 - 8.6 x10^9/L  Neutrophil % 69.2 37 - 80 %  Lymphocyte Count 0.8 0.6 - 4.2 x10^9/L  Lymphocyte % 18.2 10 - 50 %  Monocyte Count 0.5 0 - 0.9 x10^9/L  Monocyte % 10.7 0 - 12 %  Eosinophil Count 0.07 0 - 0.70 x10^9/L  Eosinophil % 1.5 0 - 7 %  Basophil Count 0.01 0 - 0.20 x10^9/L  Basophil % 0.2 0 - 2 %  Immature Granulocyte Count 0.01 <=0.06 x10^9/L  Immature Granulocyte % 0.2 <=0.7 %  Comprehensive Metabolic Panel (CMP)  Result Value Ref Range  Sodium 135 135 - 145 mmol/L  Potassium 4.4 3.5 - 5.0 mmol/L  Chloride 99 98 - 108 mmol/L  Carbon Dioxide (CO2) 31 (H) 21 - 30 mmol/L  Urea Nitrogen (BUN) 22 (H) 7 - 20 mg/dL  Creatinine 1.0 0.6 - 1.3 mg/dL  Glucose 93 70 - 140 mg/dL  Calcium 9.4 8.7 - 10.2 mg/dL  AST (Aspartate Aminotransferase) 24 15 - 41 U/L  ALT (Alanine Aminotransferase) 18 17 - 63 U/L  Bilirubin, Total 0.7 0.4 - 1.5 mg/dL  Alk Phos (Alkaline  Phosphatase) 62 24 - 110 U/L  Albumin 4.0 3.5 - 4.8 g/dL  Protein, Total 7.0 5.8 - 7.8 g/dL  Anion Gap 5 3 - 12 mmol/L  BUN/CREA Ratio 22 <30  Glomerular Filtration Rate (eGFR) 73 mL/min/1.73sq m  Magnesium  Result Value Ref Range  Magnesium 2.1 1.8 - 2.5 mg/dL     HLD: Lipids elevated 08/2017-->pt declined statin at that time.    Past Medical History:  Diagnosis Date  . Alcoholism in remission ([place])    [facility] three times per week as of 01/2017.  Marland Kitchen Bone lesion   . Depression   . Family history of colon cancer in mother   . Hyperlipidemia    Pt declined statin 08/2017  . Hypertension   . Insomnia    uses quetiapine for this  . Multiple myeloma ([place])    [facility]  . Nephrolithiasis   . Osteoarthritis    C-spine--hx of fusion surgery.  R foot. Knees (hx of L TKA)  . Prostate cancer [facility])    [facility]    Past Surgical History:  Procedure Laterality Date  . ANTERIOR FUSION CERVICAL SPINE  2002  . autologous stem cell transplant  2010 and 2012   [place] in [place].  Marland Kitchen COLONOSCOPY  2015   Recall 5 yrs ([facility] in [place], [place])  . EXCISIONAL TOTAL KNEE ARTHROPLASTY  2014 and 2016   Both knees  . LAPAROSCOPIC RETROPUBIC PROSTATECTOMY     + hx of radiation to pelvis.    Outpatient Medications Prior to Visit  Medication Sig Dispense Refill  . acetaminophen (TYLENOL) 325 MG tablet Take 650 mg by mouth every 4 (four) hours as needed.     Marland Kitchen acyclovir (ZOVIRAX) 400 MG tablet Take 1 tablet (400 mg total) by mouth daily. 90 tablet 3  . amLODipine (NORVASC) 10 MG tablet Take one tablet (10 mg total) by mouth daily. 90 tablet 1  . ammonium lactate (AMLACTIN) 12 % cream Apply at least once daily after bathing and patting dry.  99  . amoxicillin-clavulanate (AUGMENTIN) 875-125 MG tablet Take 1 tablet by mouth 2 (two) times daily. 20 tablet 0  . aspirin EC 81 MG tablet Take by mouth.    Marland Kitchen atorvastatin (LIPITOR) 20 MG  tablet Take 1 tablet (20 mg total) by mouth daily. 30 tablet 2  . bortezomib IV (VELCADE) 3.5 MG injection Inject into the skin.    . clobetasol (TEMOVATE) 0.05 % external solution Apply topically.    . Fluocinolone Acetonide Body 0.01 % OIL Apply daily to affected areas.  Stop when smooth.    . fluorouracil (EFUDEX) 5 % cream Apply once daily to scalp for two weeks  2  . FLUoxetine (PROZAC) 40 MG capsule Take 1 capsule (40 mg total) by mouth daily. 30 capsule 5  . lisinopril (PRINIVIL,ZESTRIL) 30 MG tablet Take 1 tablet (30 mg total) by mouth daily. 90 tablet 0  . multivitamin (ONE-A-DAY MEN'S) TABS tablet Take 1 tablet by mouth daily. 30 tablet 1  . ondansetron (ZOFRAN ODT) 4 MG disintegrating tablet Take 1 tablet (4 mg total) by mouth every 8 (eight) hours as needed for nausea or vomiting. 20 tablet 0  . ondansetron (ZOFRAN) 8 MG tablet TAKE 1 TABLET (8 MG TOTAL) BY MOUTH EVERY 8 (EIGHT) HOURS AS NEEDED FOR NAUSEA FOR UP TO 30 DOSES.  3  . QUEtiapine (SEROQUEL) 100 MG tablet Take 1 tablet (100 mg total) by mouth at bedtime. 90 tablet 1  . thiamine (VITAMIN B-1) 100 MG tablet Take 1 tablet (100 mg total) by mouth daily. 30 tablet 1  . zolendronic acid (ZOMETA) 4 MG/5ML injection Inject into the vein.    Marland Kitchen zolpidem (AMBIEN) 10 MG tablet TAKE ONE TABLET BY MOUTH NIGHTLY AS NEEDED FOR SLEEP 30 tablet 5   No facility-administered medications prior to visit.     No Known Allergies  ROS As per HPI  PE: There were no vitals taken for this visit. ***  LABS:  No results found for: TSH Lab Results  Component Value Date   WBC (H) 06/14/2017    10.9 A Manual Differential was performed and is consistent with the Automated Differential.   HGB 11.1 (L) 06/14/2017   HCT 34.0 (L) 06/14/2017   MCV 93.2 06/14/2017   PLT 251.0 06/14/2017   Lab Results  Component Value Date   CREATININE 0.97 09/08/2017   BUN 25 (H) 09/08/2017   NA 137 09/08/2017   K 4.7 09/08/2017   CL 101 09/08/2017   CO2  30 09/08/2017   Lab Results  Component Value Date   ALT 13 09/08/2017   AST 18 09/08/2017  ALKPHOS 58 09/08/2017   BILITOT 0.4 09/08/2017   Lab Results  Component Value Date   CHOL 255 (H) 09/08/2017   Lab Results  Component Value Date   HDL 33.40 (L) 09/08/2017   Lab Results  Component Value Date   LDLCALC 184 (H) 09/08/2017   Lab Results  Component Value Date   TRIG 189.0 (H) 09/08/2017   Lab Results  Component Value Date   CHOLHDL 8 09/08/2017   Lab Results  Component Value Date   HGBA1C 6.0 09/08/2017    IMPRESSION AND PLAN:  No problem-specific Assessment & Plan notes found for this encounter.     An After Visit Summary was printed and given to the patient.  FOLLOW UP: No follow-ups on file.  Signed:  Crissie Sickles, MD           12/22/2017

## 2017-12-25 DIAGNOSIS — C9000 Multiple myeloma not having achieved remission: Secondary | ICD-10-CM | POA: Diagnosis not present

## 2017-12-27 ENCOUNTER — Other Ambulatory Visit: Payer: Self-pay | Admitting: Family Medicine

## 2018-01-05 ENCOUNTER — Other Ambulatory Visit: Payer: Self-pay | Admitting: Family Medicine

## 2018-01-05 NOTE — Telephone Encounter (Signed)
Patient calling and states that he would like a 90 day supply of this medication

## 2018-01-06 ENCOUNTER — Other Ambulatory Visit: Payer: Self-pay

## 2018-01-06 ENCOUNTER — Encounter (HOSPITAL_BASED_OUTPATIENT_CLINIC_OR_DEPARTMENT_OTHER): Payer: Self-pay | Admitting: *Deleted

## 2018-01-06 ENCOUNTER — Emergency Department (HOSPITAL_BASED_OUTPATIENT_CLINIC_OR_DEPARTMENT_OTHER): Payer: Medicare Other

## 2018-01-06 ENCOUNTER — Emergency Department (HOSPITAL_BASED_OUTPATIENT_CLINIC_OR_DEPARTMENT_OTHER)
Admission: EM | Admit: 2018-01-06 | Discharge: 2018-01-07 | Disposition: A | Discharge status: Home / Self Care | Payer: Medicare Other | Attending: Emergency Medicine | Admitting: Emergency Medicine

## 2018-01-06 DIAGNOSIS — S39012A Strain of muscle, fascia and tendon of lower back, initial encounter: Secondary | ICD-10-CM | POA: Diagnosis not present

## 2018-01-06 DIAGNOSIS — Y9389 Activity, other specified: Secondary | ICD-10-CM | POA: Diagnosis not present

## 2018-01-06 DIAGNOSIS — S199XXA Unspecified injury of neck, initial encounter: Secondary | ICD-10-CM | POA: Diagnosis not present

## 2018-01-06 DIAGNOSIS — Z8546 Personal history of malignant neoplasm of prostate: Secondary | ICD-10-CM | POA: Diagnosis not present

## 2018-01-06 DIAGNOSIS — Y999 Unspecified external cause status: Secondary | ICD-10-CM | POA: Insufficient documentation

## 2018-01-06 DIAGNOSIS — Z87891 Personal history of nicotine dependence: Secondary | ICD-10-CM | POA: Diagnosis not present

## 2018-01-06 DIAGNOSIS — S161XXA Strain of muscle, fascia and tendon at neck level, initial encounter: Secondary | ICD-10-CM

## 2018-01-06 DIAGNOSIS — Z79899 Other long term (current) drug therapy: Secondary | ICD-10-CM | POA: Insufficient documentation

## 2018-01-06 DIAGNOSIS — C9000 Multiple myeloma not having achieved remission: Secondary | ICD-10-CM | POA: Diagnosis not present

## 2018-01-06 DIAGNOSIS — S29019A Strain of muscle and tendon of unspecified wall of thorax, initial encounter: Secondary | ICD-10-CM | POA: Insufficient documentation

## 2018-01-06 DIAGNOSIS — W19XXXA Unspecified fall, initial encounter: Secondary | ICD-10-CM

## 2018-01-06 DIAGNOSIS — Y929 Unspecified place or not applicable: Secondary | ICD-10-CM | POA: Diagnosis not present

## 2018-01-06 DIAGNOSIS — I10 Essential (primary) hypertension: Secondary | ICD-10-CM | POA: Insufficient documentation

## 2018-01-06 DIAGNOSIS — S0990XA Unspecified injury of head, initial encounter: Secondary | ICD-10-CM | POA: Diagnosis not present

## 2018-01-06 DIAGNOSIS — W0110XA Fall on same level from slipping, tripping and stumbling with subsequent striking against unspecified object, initial encounter: Secondary | ICD-10-CM | POA: Diagnosis not present

## 2018-01-06 DIAGNOSIS — S0003XA Contusion of scalp, initial encounter: Secondary | ICD-10-CM | POA: Insufficient documentation

## 2018-01-06 HISTORY — DX: Unspecified fall, initial encounter: W19.XXXA

## 2018-01-06 MED ORDER — LORAZEPAM 2 MG/ML IJ SOLN: 1.0000 mg | Freq: Once | INTRAMUSCULAR | Status: DC

## 2018-01-06 MED ORDER — DIAZEPAM 5 MG PO TABS: 5.0000 mg | ORAL_TABLET | Freq: Two times a day (BID) | ORAL | 0 refills | Status: DC

## 2018-01-06 MED ORDER — LORAZEPAM 2 MG/ML IJ SOLN
1.0000 mg | Freq: Once | INTRAMUSCULAR | Status: AC
  Administered 2018-01-06: 1 mg via INTRAVENOUS
  Filled 2018-01-06: qty 1

## 2018-01-06 MED ORDER — ONDANSETRON HCL 4 MG/2ML IJ SOLN
4.0000 mg | Freq: Once | INTRAMUSCULAR | Status: AC
  Administered 2018-01-06: 4 mg via INTRAVENOUS
  Filled 2018-01-06: qty 2

## 2018-01-06 MED ORDER — MORPHINE SULFATE (PF) 4 MG/ML IV SOLN: 4.0000 mg | Freq: Once | INTRAVENOUS | Status: DC

## 2018-01-06 MED ORDER — MORPHINE SULFATE (PF) 4 MG/ML IV SOLN
4.0000 mg | Freq: Once | INTRAVENOUS | Status: AC
  Administered 2018-01-06: 4 mg via INTRAVENOUS
  Filled 2018-01-06: qty 1

## 2018-01-06 MED ORDER — HYDROCODONE-ACETAMINOPHEN 5-325 MG PO TABS: 1.0000 | ORAL_TABLET | ORAL | 0 refills | Status: DC | PRN

## 2018-01-06 NOTE — ED Triage Notes (Signed)
PT reports that he fell today while fishing. Reports upper to mid back pain.  Hx of back fracture. Denies neck pain. Denies hitting head or LOC. Ambulatory with assistance to wheelchair.

## 2018-01-06 NOTE — ED Provider Notes (Addendum)
[facility] Provider Note   CSN: [id number] Arrival date & time: 01/06/18  2013     History   Chief Complaint Chief Complaint  Patient presents with  . Fall    HPI Derek Castro is a 77 y.o. male.  Pt presents to the ED today with upper back pain.  Pt was fishing this evening in crocs.  He walked up the muddy bank with those shoes and slipped.  He fell backwards hitting his head.  He said he was stunned, but no loc.  He does have a hx of cervical fx with surgery and has pain in his neck.       Past Medical History:  Diagnosis Date  . Alcoholism in remission ([place])    [facility] three times per week as of 01/2017.  Marland Kitchen Bone lesion   . Depression   . Family history of colon cancer in mother   . Hyperlipidemia    Pt declined statin 08/2017  . Hypertension   . Insomnia    uses quetiapine for this  . Multiple myeloma ([place])    [facility]  . Nephrolithiasis   . Osteoarthritis    C-spine--hx of fusion surgery.  R foot. Knees (hx of L TKA)  . Prostate cancer [facility])    [facility]    Patient Active Problem List   Diagnosis Date Noted  . Dysuria 06/02/2016  . Malignant neoplasm of prostate ([place]) 03/28/2016    Past Surgical History:  Procedure Laterality Date  . ANTERIOR FUSION CERVICAL SPINE  2002  . autologous stem cell transplant  2010 and 2012   [facility] in [place].  Marland Kitchen COLONOSCOPY  2015   Recall 5 yrs ([facility] in [place], [place])  . EXCISIONAL TOTAL KNEE ARTHROPLASTY  2014 and 2016   Both knees  . LAPAROSCOPIC RETROPUBIC PROSTATECTOMY     + hx of radiation to pelvis.        Home Medications    Prior to Admission medications   Medication Sig Start Date End Date Taking? Authorizing Provider  acetaminophen (TYLENOL) 325 MG tablet Take 650 mg by mouth every 4 (four) hours as needed.     [provider]  acyclovir (ZOVIRAX) 400 MG tablet Take 1 tablet (400 mg total) by mouth  daily. 01/31/17   McGowen, Adrian Blackwater, MD  amLODipine (NORVASC) 10 MG tablet TAKE ONE TABLET BY MOUTH DAILY 12/27/17   McGowen, Adrian Blackwater, MD  ammonium lactate (AMLACTIN) 12 % cream Apply at least once daily after bathing and patting dry. 09/16/16   [provider]  amoxicillin-clavulanate (AUGMENTIN) 875-125 MG tablet Take 1 tablet by mouth 2 (two) times daily. 11/09/17   McGowen, Adrian Blackwater, MD  aspirin EC 81 MG tablet Take by mouth. 08/31/13   [provider]  atorvastatin (LIPITOR) 20 MG tablet Take 1 tablet (20 mg total) by mouth daily. 09/11/17   McGowen, Adrian Blackwater, MD  bortezomib IV (VELCADE) 3.5 MG injection Inject into the skin.    [provider]  diazepam (VALIUM) 5 MG tablet Take 1 tablet (5 mg total) by mouth 2 (two) times daily. 01/06/18   Isla Pence, MD  Fluocinolone Acetonide Body 0.01 % OIL Apply daily to affected areas.  Stop when smooth. 06/10/16   [provider]  fluorouracil (EFUDEX) 5 % cream Apply once daily to scalp for two weeks 09/16/16   [provider]  FLUoxetine (PROZAC) 40 MG capsule Take 1 capsule (  40 mg total) by mouth daily. 01/05/18   McGowen, Adrian Blackwater, MD  HYDROcodone-acetaminophen (NORCO/VICODIN) 5-325 MG tablet Take 1 tablet by mouth every 4 (four) hours as needed. 01/06/18   Isla Pence, MD  lisinopril (PRINIVIL,ZESTRIL) 30 MG tablet Take 1 tablet (30 mg total) by mouth daily. 12/12/17   McGowen, Adrian Blackwater, MD  multivitamin (ONE-A-DAY MEN'S) TABS tablet Take 1 tablet by mouth daily. 01/04/17   Ward, Delice Bison, DO  ondansetron (ZOFRAN ODT) 4 MG disintegrating tablet Take 1 tablet (4 mg total) by mouth every 8 (eight) hours as needed for nausea or vomiting. 01/04/17   Ward, Delice Bison, DO  ondansetron (ZOFRAN) 8 MG tablet TAKE 1 TABLET (8 MG TOTAL) BY MOUTH EVERY 8 (EIGHT) HOURS AS NEEDED FOR NAUSEA FOR UP TO 30 DOSES. 01/18/16   [provider]  QUEtiapine (SEROQUEL) 100 MG tablet Take 1 tablet (100 mg total) by mouth at  bedtime. 10/24/17   McGowen, Adrian Blackwater, MD  thiamine (VITAMIN B-1) 100 MG tablet Take 1 tablet (100 mg total) by mouth daily. 01/04/17   Ward, Delice Bison, DO  zolendronic acid (ZOMETA) 4 MG/5ML injection Inject into the vein.    [provider]  zolpidem (AMBIEN) 10 MG tablet TAKE ONE TABLET BY MOUTH NIGHTLY AS NEEDED FOR SLEEP 11/17/17   McGowen, Adrian Blackwater, MD    Family History Family History  Problem Relation Age of Onset  . Cancer Mother        colon    Social History Social History   Tobacco Use  . Smoking status: Former Smoker    Packs/day: 1.00    Years: 5.00    Pack years: 5.00    Types: Cigarettes    Last attempt to quit: 02/11/1996    Years since quitting: 21.9  . Smokeless tobacco: Never Used  Substance Use Topics  . Alcohol use: No  . Drug use: No     Allergies   Patient has no known allergies.   Review of Systems Review of Systems  Musculoskeletal: Positive for back pain and neck pain.  Neurological: Positive for headaches.  All other systems reviewed and are negative.    Physical Exam Updated Vital Signs BP 133/61 (BP Location: Left Arm)   Pulse (!) 59   Temp 98.4 F (36.9 C) (Oral)   Resp 16   Ht '5\' 10"'  (1.778 m)   Wt 108.9 kg   SpO2 95%   BMI 34.44 kg/m   Physical Exam  Constitutional: He is oriented to person, place, and time. He appears well-developed and well-nourished.  HENT:  Head: Normocephalic and atraumatic.  Right Ear: External ear normal.  Left Ear: External ear normal.  Nose: Nose normal.  Mouth/Throat: Oropharynx is clear and moist.  Eyes: Pupils are equal, round, and reactive to light. Conjunctivae and EOM are normal.  Neck: Neck supple. Spinous process tenderness and muscular tenderness present.  Cardiovascular: Normal rate, regular rhythm, normal heart sounds and intact distal pulses.  Pulmonary/Chest: Effort normal and breath sounds normal.  Abdominal: Soft. Bowel sounds are normal.  Musculoskeletal: Normal range of  motion.       Back:  Neurological: He is alert and oriented to person, place, and time.  Skin: Skin is warm. Capillary refill takes less than 2 seconds.  Psychiatric: He has a normal mood and affect. His behavior is normal. Judgment and thought content normal.  Nursing note and vitals reviewed.    ED Treatments / Results  Labs (all labs ordered are  listed, but only abnormal results are displayed) Labs Reviewed - No data to display  EKG None  Radiology Dg Thoracic Spine W/swimmers  Result Date: 01/06/2018 CLINICAL DATA:  Multiple myeloma with radiation to the thoracic spine. EXAM: THORACIC SPINE 2 VIEWS COMPARISON:  CT 06/26/2017 FINDINGS: Patchy osteopenic appearance of the thoracic spine with chronic superior endplate compression of T5 unchanged in appearance. Lytic appearance of T5 is stable. Small lytic lucencies are also noted of T3 and T4, similar in appearance to prior CT. No acute osseous abnormality. Slightly expansile stable focal abnormality of what corresponds with the right fifth rib on prior CT is seen on the lateral view and largely unchanged in appearance. IMPRESSION: 1. Chronic stable lytic abnormalities of the upper thoracic spine in keeping with the patient's known multiple myeloma. Superior endplate compression of T5 appears stable. 2. Focal bony expansion of what corresponds with right fifth rib is unchanged relative to prior CT. 3. No acute osseous appearing abnormality. Electronically Signed   By: Ashley Royalty M.D.   On: 01/06/2018 22:38   Ct Head Wo Contrast  Result Date: 01/06/2018 CLINICAL DATA:  Status post fall backwards, with occipital abrasion. Concern for head or cervical spine injury. Personal history of multiple myeloma. EXAM: CT HEAD WITHOUT CONTRAST CT CERVICAL SPINE WITHOUT CONTRAST TECHNIQUE: Multidetector CT imaging of the head and cervical spine was performed following the standard protocol without intravenous contrast. Multiplanar CT image  reconstructions of the cervical spine were also generated. COMPARISON:  CT of the head and cervical spine performed 01/04/2017 FINDINGS: CT HEAD FINDINGS Brain: No evidence of acute infarction, hemorrhage, hydrocephalus, extra-axial collection or mass lesion / mass effect. Prominence of the ventricles and sulci reflects mild cortical volume loss. Mild periventricular white matter change likely reflects small vessel ischemic microangiopathy. The brainstem and fourth ventricle are within normal limits. The basal ganglia are unremarkable in appearance. The cerebral hemispheres demonstrate grossly normal gray-white differentiation. No mass effect or midline shift is seen. Vascular: No hyperdense vessel or unexpected calcification. Skull: There is no evidence of fracture; heterotopic bone formation is noted at the right temporomandibular joint, likely reflecting remote traumatic injury. Sinuses/Orbits: The orbits are within normal limits. Mucosal thickening is noted at the maxillary sinuses bilaterally. The remaining paranasal sinuses and mastoid air cells are well-aerated. Other: No significant soft tissue abnormalities are seen. CT CERVICAL SPINE FINDINGS Alignment: Grossly normal. Skull base and vertebrae: There is chronic diffuse resorption of C2 and C3, with surrounding degenerative change and underlying posterior fusion hardware. No definite acute fracture is seen. Soft tissues and spinal canal: No prevertebral fluid or swelling. No visible canal hematoma. Disc levels: Mild intervertebral disc space narrowing is noted along the lower cervical and upper thoracic spine. Scattered anterior and posterior disc osteophyte complexes are seen along the cervical spine. Upper chest: The visualized lung apices are clear. A small hypodensity at the right thyroid lobe is likely benign, given its size. A calcification is also noted at the right thyroid lobe. Dense calcification is noted at the proximal internal carotid arteries  bilaterally. Other: No additional soft tissue abnormalities are seen. IMPRESSION: 1. No evidence of traumatic intracranial injury or fracture. 2. No evidence of fracture or subluxation along the cervical spine. 3. Mild cortical volume loss and scattered small vessel ischemic microangiopathy. 4. Chronic diffuse resorption of C2 and C3, with surrounding degenerative change and underlying posterior fusion hardware. Degenerative change along the cervical spine. 5. Heterotopic bone formation at the right temporomandibular joint likely reflects remote  traumatic injury. 6. Mucosal thickening at the maxillary sinuses bilaterally. 7. Dense calcification at the proximal internal carotid arteries bilaterally. Carotid ultrasound would be helpful for further evaluation, when and as deemed clinically appropriate. Electronically Signed   By: Garald Balding M.D.   On: 01/06/2018 22:39   Ct Cervical Spine Wo Contrast  Result Date: 01/06/2018 CLINICAL DATA:  Status post fall backwards, with occipital abrasion. Concern for head or cervical spine injury. Personal history of multiple myeloma. EXAM: CT HEAD WITHOUT CONTRAST CT CERVICAL SPINE WITHOUT CONTRAST TECHNIQUE: Multidetector CT imaging of the head and cervical spine was performed following the standard protocol without intravenous contrast. Multiplanar CT image reconstructions of the cervical spine were also generated. COMPARISON:  CT of the head and cervical spine performed 01/04/2017 FINDINGS: CT HEAD FINDINGS Brain: No evidence of acute infarction, hemorrhage, hydrocephalus, extra-axial collection or mass lesion / mass effect. Prominence of the ventricles and sulci reflects mild cortical volume loss. Mild periventricular white matter change likely reflects small vessel ischemic microangiopathy. The brainstem and fourth ventricle are within normal limits. The basal ganglia are unremarkable in appearance. The cerebral hemispheres demonstrate grossly normal gray-white  differentiation. No mass effect or midline shift is seen. Vascular: No hyperdense vessel or unexpected calcification. Skull: There is no evidence of fracture; heterotopic bone formation is noted at the right temporomandibular joint, likely reflecting remote traumatic injury. Sinuses/Orbits: The orbits are within normal limits. Mucosal thickening is noted at the maxillary sinuses bilaterally. The remaining paranasal sinuses and mastoid air cells are well-aerated. Other: No significant soft tissue abnormalities are seen. CT CERVICAL SPINE FINDINGS Alignment: Grossly normal. Skull base and vertebrae: There is chronic diffuse resorption of C2 and C3, with surrounding degenerative change and underlying posterior fusion hardware. No definite acute fracture is seen. Soft tissues and spinal canal: No prevertebral fluid or swelling. No visible canal hematoma. Disc levels: Mild intervertebral disc space narrowing is noted along the lower cervical and upper thoracic spine. Scattered anterior and posterior disc osteophyte complexes are seen along the cervical spine. Upper chest: The visualized lung apices are clear. A small hypodensity at the right thyroid lobe is likely benign, given its size. A calcification is also noted at the right thyroid lobe. Dense calcification is noted at the proximal internal carotid arteries bilaterally. Other: No additional soft tissue abnormalities are seen. IMPRESSION: 1. No evidence of traumatic intracranial injury or fracture. 2. No evidence of fracture or subluxation along the cervical spine. 3. Mild cortical volume loss and scattered small vessel ischemic microangiopathy. 4. Chronic diffuse resorption of C2 and C3, with surrounding degenerative change and underlying posterior fusion hardware. Degenerative change along the cervical spine. 5. Heterotopic bone formation at the right temporomandibular joint likely reflects remote traumatic injury. 6. Mucosal thickening at the maxillary sinuses  bilaterally. 7. Dense calcification at the proximal internal carotid arteries bilaterally. Carotid ultrasound would be helpful for further evaluation, when and as deemed clinically appropriate. Electronically Signed   By: Garald Balding M.D.   On: 01/06/2018 22:39    Procedures Procedures (including critical care time)  Medications Ordered in ED Medications  morphine 4 MG/ML injection 4 mg (has no administration in time range)  ondansetron (ZOFRAN) injection 4 mg (has no administration in time range)  LORazepam (ATIVAN) injection 1 mg (has no administration in time range)  morphine 4 MG/ML injection 4 mg (has no administration in time range)  LORazepam (ATIVAN) injection 1 mg (has no administration in time range)     Initial Impression /  Assessment and Plan / ED Course  I have reviewed the triage vital signs and the nursing notes.  Pertinent labs & imaging results that were available during my care of the patient were reviewed by me and considered in my medical decision making (see chart for details).    No evidence of fracture.  Pt able to ambulate.  Pt to be d/c home on lortab and valium.  Return if worse.  F/u with pcp.  Final Clinical Impressions(s) / ED Diagnoses   Final diagnoses:  Fall, initial encounter  Cervical strain, acute, initial encounter  Thoracic myofascial strain, initial encounter  Contusion of scalp, initial encounter    ED Discharge Orders         Ordered    diazepam (VALIUM) 5 MG tablet  2 times daily     01/06/18 2259    HYDROcodone-acetaminophen (NORCO/VICODIN) 5-325 MG tablet  Every 4 hours PRN     01/06/18 2259           Isla Pence, MD 01/06/18 2300    Isla Pence, MD 01/06/18 2340

## 2018-01-06 NOTE — ED Notes (Signed)
Pt ambulated in hallway with assistance. 

## 2018-01-08 ENCOUNTER — Encounter: Payer: Self-pay | Admitting: Family Medicine

## 2018-01-08 ENCOUNTER — Ambulatory Visit (INDEPENDENT_AMBULATORY_CARE_PROVIDER_SITE_OTHER): Payer: Medicare Other | Admitting: Family Medicine

## 2018-01-08 VITALS — BP 111/65 | HR 68 | Temp 98.5°F | Resp 20 | Ht 70.0 in | Wt 247.5 lb

## 2018-01-08 DIAGNOSIS — T161XXA Foreign body in right ear, initial encounter: Secondary | ICD-10-CM | POA: Diagnosis not present

## 2018-01-08 MED ORDER — CARBAMIDE PEROXIDE 6.5 % OT SOLN: 5.0000 [drp] | Freq: Two times a day (BID) | OTIC | 0 refills | Status: DC

## 2018-01-08 NOTE — Patient Instructions (Signed)
Use debrox solution twice a day and follow it with warm water flushes. Hopefully this will continue to flush cotton out. We were able to remove a few small pieces today, but the majority of it is deep in your ear canal.   If not fully removed by your ENT appt, they will be able to remove it for you.  If needing to see ENT sooner secondary to pain or discomfort> call in and I will be happy to see if we can get you in sooner with them.  Do not use a suction device or stick anything in the ear to try and retrieve the cotton--> you could rupture your ear drum and cause more damage.

## 2018-01-08 NOTE — Progress Notes (Signed)
Derek Castro , 1941-03-23, 77 y.o., male MRN: [id number] Patient Care Team    Relationship Specialty Notifications Start End  McGowen, Adrian Blackwater, MD PCP - General Family Medicine  11/18/16   Jeanann Lewandowsky, MD Consulting Physician Internal Medicine  06/05/17     Chief Complaint  Patient presents with  . Foreign Body in Ear    right ear (Q-tip)     Subjective: Pt presents for an OV with complaints of cotton tip of Q-tip stuck in his right ear of 2 weeks duration.  Associated symptoms include muffled sounding/decrease hearing. He used a "cheap" q-tip and the tip came off, he then tried to get it out and thinks he pushed it back further. He has an ENT appt next month for another issue.    Depression screen [facility] 2/9 06/05/2017 06/05/2017 02/23/2017 08/01/2016 03/28/2016  Decreased Interest 0 0 0 0 0  Down, Depressed, Hopeless 0 0 0 0 0  PHQ - 2 Score 0 0 0 0 0  Altered sleeping 0 - 0 - -  Tired, decreased energy 0 - 0 - -  Change in appetite 0 - 0 - -  Feeling bad or failure about yourself  0 - 0 - -  Trouble concentrating 0 - 0 - -  Moving slowly or fidgety/restless 0 - 0 - -  Suicidal thoughts 0 - 0 - -  PHQ-9 Score 0 - 0 - -    No Known Allergies Social History   Tobacco Use  . Smoking status: Former Smoker    Packs/day: 1.00    Years: 5.00    Pack years: 5.00    Types: Cigarettes    Last attempt to quit: 02/11/1996    Years since quitting: 21.9  . Smokeless tobacco: Never Used  Substance Use Topics  . Alcohol use: No   Past Medical History:  Diagnosis Date  . Alcoholism in remission ([place])    [facility] counseling three times per week as of 01/2017.  Marland Kitchen Bone lesion   . Depression   . Family history of colon cancer in mother   . Hyperlipidemia    Pt declined statin 08/2017  . Hypertension   . Insomnia    uses quetiapine for this  . Multiple myeloma ([place])    [facility]  . Nephrolithiasis   . Osteoarthritis    C-spine--hx of fusion surgery.  R  foot. Knees (hx of L TKA)  . Prostate cancer [facility])    [facility]   Past Surgical History:  Procedure Laterality Date  . ANTERIOR FUSION CERVICAL SPINE  2002  . autologous stem cell transplant  2010 and 2012   [facility] in [place].  Marland Kitchen COLONOSCOPY  2015   Recall 5 yrs ([facility] in [place], [place])  . EXCISIONAL TOTAL KNEE ARTHROPLASTY  2014 and 2016   Both knees  . LAPAROSCOPIC RETROPUBIC PROSTATECTOMY     + hx of radiation to pelvis.   Family History  Problem Relation Age of Onset  . Cancer Mother        colon   Allergies as of 01/08/2018   No Known Allergies     Medication List        Accurate as of 01/08/18  2:35 PM. Always use your most recent med list.          acetaminophen 325 MG tablet Commonly known as:  TYLENOL Take 650 mg by mouth every 4 (four) hours as needed.  acyclovir 400 MG tablet Commonly known as:  ZOVIRAX Take 1 tablet (400 mg total) by mouth daily.   amLODipine 10 MG tablet Commonly known as:  NORVASC TAKE ONE TABLET BY MOUTH DAILY   ammonium lactate 12 % cream Commonly known as:  AMLACTIN Apply at least once daily after bathing and patting dry.   aspirin EC 81 MG tablet Take by mouth.   atorvastatin 20 MG tablet Commonly known as:  LIPITOR Take 1 tablet (20 mg total) by mouth daily.   bortezomib IV 3.5 MG injection Commonly known as:  VELCADE Inject into the skin.   diazepam 5 MG tablet Commonly known as:  VALIUM Take 1 tablet (5 mg total) by mouth 2 (two) times daily.   Fluocinolone Acetonide Body 0.01 % Oil Apply daily to affected areas.  Stop when smooth.   fluorouracil 5 % cream Commonly known as:  EFUDEX Apply once daily to scalp for two weeks   FLUoxetine 40 MG capsule Commonly known as:  PROZAC Take 1 capsule (40 mg total) by mouth daily.   HYDROcodone-acetaminophen 5-325 MG tablet Commonly known as:  NORCO/VICODIN Take 1 tablet by mouth every 4 (four) hours as needed.   lisinopril 30 MG  tablet Commonly known as:  PRINIVIL,ZESTRIL Take 1 tablet (30 mg total) by mouth daily.   multivitamin Tabs tablet Take 1 tablet by mouth daily.   ondansetron 4 MG disintegrating tablet Commonly known as:  ZOFRAN-ODT Take 1 tablet (4 mg total) by mouth every 8 (eight) hours as needed for nausea or vomiting.   ondansetron 8 MG tablet Commonly known as:  ZOFRAN TAKE 1 TABLET (8 MG TOTAL) BY MOUTH EVERY 8 (EIGHT) HOURS AS NEEDED FOR NAUSEA FOR UP TO 30 DOSES.   QUEtiapine 100 MG tablet Commonly known as:  SEROQUEL Take 1 tablet (100 mg total) by mouth at bedtime.   thiamine 100 MG tablet Commonly known as:  VITAMIN B-1 Take 1 tablet (100 mg total) by mouth daily.   zolendronic acid 4 MG/5ML injection Commonly known as:  ZOMETA Inject into the vein.   zolpidem 10 MG tablet Commonly known as:  AMBIEN TAKE ONE TABLET BY MOUTH NIGHTLY AS NEEDED FOR SLEEP       All past medical history, surgical history, allergies, family history, immunizations andmedications were updated in the EMR today and reviewed under the history and medication portions of their EMR.     ROS: Negative, with the exception of above mentioned in HPI   Objective:  BP 111/65 (BP Location: Left Arm, Patient Position: Sitting, Cuff Size: Large)   Pulse 68   Temp 98.5 F (36.9 C)   Resp 20   Ht '5\' 10"'  (1.778 m)   Wt 247 lb 8 oz (112.3 kg)   SpO2 96%   BMI 35.51 kg/m  Body mass index is 35.51 kg/m. Gen: Afebrile. No acute distress. Nontoxic in appearance, well developed, well nourished.  HENT: AT. [place]. Right TM- unable to visualize with cotton swab in canal. Left TM WNL- mild cerumen against TM. MMM Eyes:Pupils Equal Round Reactive to light, Extraocular movements intact,  Conjunctiva without redness, discharge or icterus..  Neuro:  Normal gait. PERLA. EOMi. Alert. Oriented x3   No exam data present No results found. No results found for this or any previous visit (from the past 24  hour(s)).  Assessment/Plan: Derek Castro is a 77 y.o. male present for OV for  Acute foreign body of right ear, initial encounter - ear lavage completed today. Instrumentation  used as was able to retrieve small portions of swab, a large portion still remained against TM. Visualization of TM able to be performed and it is intact and w/o erythema.  - debrox BID with warm water flushes. Pt advised to Avoid placing instruments or suction device on ear.  He has ENT in 1 month--> can remove if still present.  - if bothering hip too much can call to see if we can get ENT appt pushed up.  - F/u PRN   Reviewed expectations re: course of current medical issues.  Discussed self-management of symptoms.  Outlined signs and symptoms indicating need for more acute intervention.  Patient verbalized understanding and all questions were answered.  Patient received an After-Visit Summary.   > 15 minutes spent with patient, >50% of time spent face to face   No orders of the defined types were placed in this encounter.    Note is dictated utilizing voice recognition software. Although note has been proof read prior to signing, occasional typographical errors still can be missed. If any questions arise, please do not hesitate to call for verification.   electronically signed by:  Howard Pouch, DO  [place]

## 2018-01-09 ENCOUNTER — Telehealth: Payer: Self-pay

## 2018-01-09 NOTE — Telephone Encounter (Signed)
Copied from [place] [id number]. Topic: General - Other >> Jan 09, 2018  9:10 AM Marin Olp L wrote: Reason for CRM: Patient needs to know when he needs another colonoscopy? Please advise. >> Jan 09, 2018  9:12 AM Marin Olp L wrote: Leave vmail if no answer

## 2018-01-09 NOTE — Telephone Encounter (Signed)
Left detailed message advising pt that he is due for his next colonoscopy in 2020. Not sure when, we do not have records of his prior colonoscopy just a note that he was to return in 5 yrs for recheck. Okay per DPR.

## 2018-01-10 ENCOUNTER — Other Ambulatory Visit: Payer: Self-pay | Admitting: Otolaryngology

## 2018-01-10 DIAGNOSIS — C44209 Unspecified malignant neoplasm of skin of left ear and external auricular canal: Secondary | ICD-10-CM | POA: Diagnosis not present

## 2018-01-10 DIAGNOSIS — K219 Gastro-esophageal reflux disease without esophagitis: Secondary | ICD-10-CM | POA: Diagnosis not present

## 2018-01-10 DIAGNOSIS — R05 Cough: Secondary | ICD-10-CM | POA: Diagnosis not present

## 2018-01-10 DIAGNOSIS — R070 Pain in throat: Secondary | ICD-10-CM | POA: Diagnosis not present

## 2018-01-16 DIAGNOSIS — Z5111 Encounter for antineoplastic chemotherapy: Secondary | ICD-10-CM | POA: Diagnosis not present

## 2018-01-16 DIAGNOSIS — C9000 Multiple myeloma not having achieved remission: Secondary | ICD-10-CM | POA: Diagnosis not present

## 2018-01-18 DIAGNOSIS — C9000 Multiple myeloma not having achieved remission: Secondary | ICD-10-CM | POA: Diagnosis not present

## 2018-01-18 DIAGNOSIS — M48061 Spinal stenosis, lumbar region without neurogenic claudication: Secondary | ICD-10-CM | POA: Diagnosis not present

## 2018-01-18 DIAGNOSIS — M5126 Other intervertebral disc displacement, lumbar region: Secondary | ICD-10-CM | POA: Diagnosis not present

## 2018-01-18 DIAGNOSIS — M8448XA Pathological fracture, other site, initial encounter for fracture: Secondary | ICD-10-CM | POA: Diagnosis not present

## 2018-01-18 DIAGNOSIS — E882 Lipomatosis, not elsewhere classified: Secondary | ICD-10-CM | POA: Diagnosis not present

## 2018-01-24 ENCOUNTER — Encounter (HOSPITAL_BASED_OUTPATIENT_CLINIC_OR_DEPARTMENT_OTHER): Payer: Self-pay | Admitting: *Deleted

## 2018-01-24 ENCOUNTER — Other Ambulatory Visit: Payer: Self-pay

## 2018-01-24 NOTE — Progress Notes (Signed)
Pt told to come in to [facility] sometime between now and 09/030/19 Mon-Fri between 7-3 pm for EKG.

## 2018-01-25 ENCOUNTER — Encounter (HOSPITAL_BASED_OUTPATIENT_CLINIC_OR_DEPARTMENT_OTHER)
Admission: RE | Admit: 2018-01-25 | Discharge: 2018-01-25 | Disposition: A | Discharge status: Home / Self Care | Payer: Medicare Other | Source: Ambulatory Visit | Attending: Otolaryngology | Admitting: Otolaryngology

## 2018-01-25 DIAGNOSIS — I10 Essential (primary) hypertension: Secondary | ICD-10-CM | POA: Diagnosis not present

## 2018-01-25 DIAGNOSIS — Z01810 Encounter for preprocedural cardiovascular examination: Secondary | ICD-10-CM | POA: Diagnosis not present

## 2018-01-25 NOTE — Progress Notes (Signed)
EKG reviewed by Dr. Turk, will proceed with surgery as scheduled. 

## 2018-01-31 ENCOUNTER — Ambulatory Visit (HOSPITAL_BASED_OUTPATIENT_CLINIC_OR_DEPARTMENT_OTHER): Payer: Medicare Other | Admitting: Anesthesiology

## 2018-01-31 ENCOUNTER — Encounter (HOSPITAL_BASED_OUTPATIENT_CLINIC_OR_DEPARTMENT_OTHER)
Admission: RE | Disposition: A | Discharge status: Home / Self Care | Payer: Self-pay | Source: Ambulatory Visit | Attending: Otolaryngology

## 2018-01-31 ENCOUNTER — Encounter (HOSPITAL_BASED_OUTPATIENT_CLINIC_OR_DEPARTMENT_OTHER): Payer: Self-pay

## 2018-01-31 ENCOUNTER — Ambulatory Visit (HOSPITAL_BASED_OUTPATIENT_CLINIC_OR_DEPARTMENT_OTHER)
Admission: RE | Admit: 2018-01-31 | Discharge: 2018-01-31 | Disposition: A | Discharge status: Home / Self Care | Payer: Medicare Other | Source: Ambulatory Visit | Attending: Otolaryngology | Admitting: Otolaryngology

## 2018-01-31 ENCOUNTER — Other Ambulatory Visit: Payer: Self-pay

## 2018-01-31 DIAGNOSIS — H938X2 Other specified disorders of left ear: Secondary | ICD-10-CM | POA: Diagnosis present

## 2018-01-31 DIAGNOSIS — M1990 Unspecified osteoarthritis, unspecified site: Secondary | ICD-10-CM | POA: Insufficient documentation

## 2018-01-31 DIAGNOSIS — H61892 Other specified disorders of left external ear: Secondary | ICD-10-CM | POA: Diagnosis not present

## 2018-01-31 DIAGNOSIS — C9000 Multiple myeloma not having achieved remission: Secondary | ICD-10-CM | POA: Diagnosis not present

## 2018-01-31 DIAGNOSIS — F329 Major depressive disorder, single episode, unspecified: Secondary | ICD-10-CM | POA: Diagnosis not present

## 2018-01-31 DIAGNOSIS — Z7982 Long term (current) use of aspirin: Secondary | ICD-10-CM | POA: Insufficient documentation

## 2018-01-31 DIAGNOSIS — Z79899 Other long term (current) drug therapy: Secondary | ICD-10-CM | POA: Diagnosis not present

## 2018-01-31 DIAGNOSIS — B078 Other viral warts: Secondary | ICD-10-CM | POA: Insufficient documentation

## 2018-01-31 DIAGNOSIS — I10 Essential (primary) hypertension: Secondary | ICD-10-CM | POA: Insufficient documentation

## 2018-01-31 DIAGNOSIS — Z87891 Personal history of nicotine dependence: Secondary | ICD-10-CM | POA: Diagnosis not present

## 2018-01-31 DIAGNOSIS — R300 Dysuria: Secondary | ICD-10-CM | POA: Diagnosis not present

## 2018-01-31 HISTORY — PX: MASS EXCISION: SHX2000

## 2018-01-31 HISTORY — PX: SKIN FULL THICKNESS GRAFT: SHX442

## 2018-01-31 HISTORY — DX: Unspecified fall, initial encounter: W19.XXXA

## 2018-01-31 SURGERY — EXCISION MASS
Anesthesia: General | Site: Ear | Laterality: Left

## 2018-01-31 MED ORDER — MIDAZOLAM HCL 2 MG/2ML IJ SOLN: 1.0000 mg | INTRAMUSCULAR | Status: DC | PRN

## 2018-01-31 MED ORDER — LIDOCAINE-EPINEPHRINE 1 %-1:100000 IJ SOLN
INTRAMUSCULAR | Status: DC | PRN
  Administered 2018-01-31: 6.5 mL

## 2018-01-31 MED ORDER — FENTANYL CITRATE (PF) 100 MCG/2ML IJ SOLN
INTRAMUSCULAR | Status: DC | PRN
  Administered 2018-01-31 (×4): 25 ug via INTRAVENOUS

## 2018-01-31 MED ORDER — CEFAZOLIN SODIUM 1 G IJ SOLR
INTRAMUSCULAR | Status: AC
  Filled 2018-01-31: qty 20

## 2018-01-31 MED ORDER — PHENYLEPHRINE 40 MCG/ML (10ML) SYRINGE FOR IV PUSH (FOR BLOOD PRESSURE SUPPORT)
PREFILLED_SYRINGE | INTRAVENOUS | Status: DC | PRN
  Administered 2018-01-31 (×5): 80 ug via INTRAVENOUS

## 2018-01-31 MED ORDER — EPHEDRINE SULFATE-NACL 50-0.9 MG/10ML-% IV SOSY
PREFILLED_SYRINGE | INTRAVENOUS | Status: DC | PRN
  Administered 2018-01-31: 15 mg via INTRAVENOUS
  Administered 2018-01-31: 10 mg via INTRAVENOUS
  Administered 2018-01-31: 15 mg via INTRAVENOUS
  Administered 2018-01-31: 10 mg via INTRAVENOUS

## 2018-01-31 MED ORDER — AMOXICILLIN 875 MG PO TABS: 875.0000 mg | ORAL_TABLET | Freq: Two times a day (BID) | ORAL | 0 refills | Status: AC

## 2018-01-31 MED ORDER — CEFAZOLIN SODIUM-DEXTROSE 2-3 GM-%(50ML) IV SOLR
INTRAVENOUS | Status: DC | PRN
  Administered 2018-01-31: 2 g via INTRAVENOUS

## 2018-01-31 MED ORDER — LIDOCAINE 2% (20 MG/ML) 5 ML SYRINGE
INTRAMUSCULAR | Status: AC
  Filled 2018-01-31: qty 5

## 2018-01-31 MED ORDER — ONDANSETRON HCL 4 MG/2ML IJ SOLN: 4.0000 mg | Freq: Once | INTRAMUSCULAR | Status: DC | PRN

## 2018-01-31 MED ORDER — VASOPRESSIN 20 UNIT/ML IV SOLN
INTRAVENOUS | Status: AC
  Filled 2018-01-31: qty 1

## 2018-01-31 MED ORDER — LIDOCAINE 2% (20 MG/ML) 5 ML SYRINGE
INTRAMUSCULAR | Status: DC | PRN
  Administered 2018-01-31: 100 mg via INTRAVENOUS

## 2018-01-31 MED ORDER — DEXAMETHASONE SODIUM PHOSPHATE 10 MG/ML IJ SOLN
INTRAMUSCULAR | Status: AC
  Filled 2018-01-31: qty 1

## 2018-01-31 MED ORDER — ONDANSETRON HCL 4 MG/2ML IJ SOLN
INTRAMUSCULAR | Status: AC
  Filled 2018-01-31: qty 2

## 2018-01-31 MED ORDER — EPHEDRINE 5 MG/ML INJ
INTRAVENOUS | Status: AC
  Filled 2018-01-31: qty 10

## 2018-01-31 MED ORDER — DEXAMETHASONE SODIUM PHOSPHATE 10 MG/ML IJ SOLN
INTRAMUSCULAR | Status: DC | PRN
  Administered 2018-01-31: 10 mg via INTRAVENOUS

## 2018-01-31 MED ORDER — SCOPOLAMINE 1 MG/3DAYS TD PT72: 1.0000 | MEDICATED_PATCH | Freq: Once | TRANSDERMAL | Status: DC | PRN

## 2018-01-31 MED ORDER — PROPOFOL 10 MG/ML IV BOLUS
INTRAVENOUS | Status: DC | PRN
  Administered 2018-01-31: 200 mg via INTRAVENOUS

## 2018-01-31 MED ORDER — FENTANYL CITRATE (PF) 100 MCG/2ML IJ SOLN: 50.0000 ug | INTRAMUSCULAR | Status: DC | PRN

## 2018-01-31 MED ORDER — LACTATED RINGERS IV SOLN
INTRAVENOUS | Status: DC
  Administered 2018-01-31 (×2): via INTRAVENOUS

## 2018-01-31 MED ORDER — LIDOCAINE-EPINEPHRINE 1 %-1:100000 IJ SOLN
INTRAMUSCULAR | Status: AC
  Filled 2018-01-31: qty 1

## 2018-01-31 MED ORDER — ONDANSETRON HCL 4 MG/2ML IJ SOLN
INTRAMUSCULAR | Status: DC | PRN
  Administered 2018-01-31: 4 mg via INTRAVENOUS

## 2018-01-31 MED ORDER — PROPOFOL 500 MG/50ML IV EMUL
INTRAVENOUS | Status: AC
  Filled 2018-01-31: qty 50

## 2018-01-31 MED ORDER — FENTANYL CITRATE (PF) 100 MCG/2ML IJ SOLN: 25.0000 ug | INTRAMUSCULAR | Status: DC | PRN

## 2018-01-31 MED ORDER — PHENYLEPHRINE 40 MCG/ML (10ML) SYRINGE FOR IV PUSH (FOR BLOOD PRESSURE SUPPORT)
PREFILLED_SYRINGE | INTRAVENOUS | Status: AC
  Filled 2018-01-31: qty 10

## 2018-01-31 MED ORDER — FENTANYL CITRATE (PF) 100 MCG/2ML IJ SOLN
INTRAMUSCULAR | Status: AC
  Filled 2018-01-31: qty 2

## 2018-01-31 MED ORDER — OXYCODONE-ACETAMINOPHEN 5-325 MG PO TABS: 1.0000 | ORAL_TABLET | ORAL | 0 refills | Status: DC | PRN

## 2018-01-31 MED ORDER — GLYCOPYRROLATE PF 0.2 MG/ML IJ SOSY
PREFILLED_SYRINGE | INTRAMUSCULAR | Status: DC | PRN
  Administered 2018-01-31: .2 mg via INTRAVENOUS

## 2018-01-31 SURGICAL SUPPLY — 63 items
ATTRACTOMAT 16X20 MAGNETIC DRP (DRAPES) IMPLANT
BLADE SURG 15 STRL LF DISP TIS (BLADE) ×4 IMPLANT
BLADE SURG 15 STRL SS (BLADE) ×2
CANISTER SUCT 1200ML W/VALVE (MISCELLANEOUS) ×3 IMPLANT
CORD BIPOLAR FORCEPS 12FT (ELECTRODE) IMPLANT
COTTONBALL LRG STERILE PKG (GAUZE/BANDAGES/DRESSINGS) IMPLANT
COVER BACK TABLE 60X90IN (DRAPES) ×3 IMPLANT
COVER MAYO STAND STRL (DRAPES) ×3 IMPLANT
DECANTER SPIKE VIAL GLASS SM (MISCELLANEOUS) IMPLANT
DERMABOND ADVANCED (GAUZE/BANDAGES/DRESSINGS) ×1
DERMABOND ADVANCED .7 DNX12 (GAUZE/BANDAGES/DRESSINGS) ×2 IMPLANT
DRAIN JACKSON RD 7FR 3/32 (WOUND CARE) IMPLANT
DRAIN PENROSE 1/4X12 LTX STRL (WOUND CARE) IMPLANT
DRAIN TLS ROUND 10FR (DRAIN) IMPLANT
DRAPE EENT ADH APERT 31X51 STR (DRAPES) IMPLANT
DRAPE SURG 17X23 STRL (DRAPES) IMPLANT
DRAPE U-SHAPE 76X120 STRL (DRAPES) ×3 IMPLANT
DRSG EMULSION OIL 3X3 NADH (GAUZE/BANDAGES/DRESSINGS) IMPLANT
ELECT COATED BLADE 2.86 ST (ELECTRODE) ×3 IMPLANT
ELECT NEEDLE BLADE 2-5/6 (NEEDLE) IMPLANT
ELECT REM PT RETURN 9FT ADLT (ELECTROSURGICAL) ×3
ELECTRODE REM PT RTRN 9FT ADLT (ELECTROSURGICAL) ×2 IMPLANT
EVACUATOR SILICONE 100CC (DRAIN) IMPLANT
FORCEPS BIPOLAR SPETZLER 8 1.0 (NEUROSURGERY SUPPLIES) IMPLANT
GAUZE 4X4 16PLY RFD (DISPOSABLE) IMPLANT
GAUZE SPONGE 4X4 12PLY STRL LF (GAUZE/BANDAGES/DRESSINGS) IMPLANT
GLOVE BIO SURGEON STRL SZ 6.5 (GLOVE) ×3 IMPLANT
GLOVE BIO SURGEON STRL SZ7.5 (GLOVE) ×3 IMPLANT
GLOVE BIOGEL PI IND STRL 7.0 (GLOVE) ×2 IMPLANT
GLOVE BIOGEL PI IND STRL 8 (GLOVE) ×2 IMPLANT
GLOVE BIOGEL PI INDICATOR 7.0 (GLOVE) ×1
GLOVE BIOGEL PI INDICATOR 8 (GLOVE) ×1
GOWN STRL REUS W/ TWL LRG LVL3 (GOWN DISPOSABLE) ×4 IMPLANT
GOWN STRL REUS W/TWL LRG LVL3 (GOWN DISPOSABLE) ×2
HEMOSTAT SURGICEL 2X14 (HEMOSTASIS) IMPLANT
NEEDLE HYPO 25X1 1.5 SAFETY (NEEDLE) ×3 IMPLANT
NS IRRIG 1000ML POUR BTL (IV SOLUTION) ×3 IMPLANT
PACK BASIN DAY SURGERY FS (CUSTOM PROCEDURE TRAY) ×3 IMPLANT
PENCIL BUTTON HOLSTER BLD 10FT (ELECTRODE) ×3 IMPLANT
PIN SAFETY STERILE (MISCELLANEOUS) IMPLANT
SHEARS HARMONIC 9CM CVD (BLADE) IMPLANT
SLEEVE SCD COMPRESS KNEE MED (MISCELLANEOUS) ×3 IMPLANT
SPONGE GAUZE 2X2 8PLY STRL LF (GAUZE/BANDAGES/DRESSINGS) IMPLANT
SUCTION FRAZIER HANDLE 10FR (MISCELLANEOUS)
SUCTION TUBE FRAZIER 10FR DISP (MISCELLANEOUS) IMPLANT
SUT ETHILON 3 0 PS 1 (SUTURE) IMPLANT
SUT ETHILON 5 0 P 3 18 (SUTURE)
SUT MON AB 5-0 P3 18 (SUTURE) IMPLANT
SUT NYLON ETHILON 5-0 P-3 1X18 (SUTURE) IMPLANT
SUT PROLENE 4 0 P 3 18 (SUTURE) IMPLANT
SUT SILK 3 0 SH CR/8 (SUTURE) IMPLANT
SUT SILK 3 0 TIES 17X18 (SUTURE)
SUT SILK 3-0 18XBRD TIE BLK (SUTURE) IMPLANT
SUT SILK 4 0 TIES 17X18 (SUTURE) IMPLANT
SUT VIC AB 3-0 FS2 27 (SUTURE) IMPLANT
SUT VIC AB 4-0 P-3 18XBRD (SUTURE) IMPLANT
SUT VIC AB 4-0 P3 18 (SUTURE)
SUT VICRYL 4-0 PS2 18IN ABS (SUTURE) ×3 IMPLANT
SYR BULB 3OZ (MISCELLANEOUS) ×3 IMPLANT
SYR CONTROL 10ML LL (SYRINGE) ×3 IMPLANT
TOWEL GREEN STERILE FF (TOWEL DISPOSABLE) ×6 IMPLANT
TUBE CONNECTING 20X1/4 (TUBING) ×3 IMPLANT
YANKAUER SUCT BULB TIP NO VENT (SUCTIONS) ×3 IMPLANT

## 2018-01-31 NOTE — Anesthesia Postprocedure Evaluation (Signed)
Anesthesia Post Note  Patient: NAKUL AVINO  Procedure(s) Performed: LEFT EXCISION MASS (Left Ear) SKIN GRAFT FULL THICKNESS (Left Abdomen)     Patient location during evaluation: PACU Anesthesia Type: General Level of consciousness: awake and alert Pain management: pain level controlled Vital Signs Assessment: post-procedure vital signs reviewed and stable Respiratory status: spontaneous breathing, nonlabored ventilation, respiratory function stable and patient connected to nasal cannula oxygen Cardiovascular status: blood pressure returned to baseline and stable Postop Assessment: no apparent nausea or vomiting Anesthetic complications: no    Last Vitals:  Vitals:   01/31/18 1030 01/31/18 1105  BP: (!) 114/54 (!) 120/53  Pulse:  70  Resp:  16  Temp:  36.4 C  SpO2:  95%    Last Pain:  Vitals:   01/31/18 1105  TempSrc: Oral  PainSc: 0-No pain                 Ryan P Ellender

## 2018-01-31 NOTE — Op Note (Signed)
DATE OF PROCEDURE: 01/31/2018  OPERATIVE REPORT   SURGEON: Leta Baptist, MD  PREOPERATIVE DIAGNOSIS: Left ear canal mass.  POSTOPERATIVE DIAGNOSIS: Left ear canal mass.  PROCEDURES PERFORMED: 1. Excision of left ear canal mass 2. Full thickness skin graft from abdomen (LLQ).  ANESTHESIA: General laryngeal mask anesthesia.  COMPLICATIONS: None.  ESTIMATED BLOOD LOSS: Minimal.  INDICATION FOR PROCEDURE:  Derek Castro is a 77 y.o. male with a gradually enlarging left ear canal mass. The 1cm mass is at the junction of the concha bowl and the entrance to the left ear canal. The appearance was concerning for basal cell carcinoma. Based on the above findings, the decision was made for the patient to undergo the above-stated procedures. The risks, benefits, alternatives, and details of the procedures were discussed with the patient. Questions were invited and answered. Informed consent was obtained.  DESCRIPTION OF PROCEDURE: The patient was taken to the operating room and placed supine on the operating table. General laryngeal mask anesthesia was induced by the anesthesiologist.  The patient was positioned and prepped and draped in a standard fashion for left ear surgery. The left lower quadrant of the abdomen was also prepped sterilely.  Attention was first focused on the left ear. The 1 cm lesion at the entrance to the left ear canal was noted. 1% lidocaine with 1-100,000 epinephrine was infiltrated around the lesion. A 1 x 2 cm elliptical incision was made around the lesion. The entire lesion, together with the subcutaneous tissue and the perichondrium were excised. The specimen was sent to the pathology department for frozen section analysis. The results was benign.   Attention was then focused on obtaining the full-thickness skin graft. A 1 x 2 cm elliptical shaped full-thickness skin graft was harvested from the left lower quadrant. The surrounding skin was extensively  undermined. The skin was closed in layers with 4-0 Vicryl and Dermabond.   The skin graft was then used to cover the surgical defect of the left ear. It was sutured in place with 5-0 plain gut sutures. A Xeroform bolster was then placed over the left ear.  The care of the patient was turned over to the anesthesiologist. The patient was awakened from anesthesia without difficulty. He was extubated and transferred to the recovery room in good condition.  OPERATIVE FINDINGS: A 1 cm left ear canal mass.  SPECIMEN: Left ear mass.  FOLLOWUP CARE: The patient will be discharged home once he is awake and alert. He will follow up in my office in 1 week.

## 2018-01-31 NOTE — H&P (Signed)
Cc: Left ear mass, globus sensation, frequent cough  HPI: The patient is a 77 year old male who presents today with multiple medical issues.  According to the patient, he first noted a mass in his left ear 1 year ago.  The size of the mass has gradually increased.  He denies any significant otalgia or otorrhea.  In addition, he suspects a cotton ball may be lodged in his right ear.  He uses Q-tips to clean his ears regularly.  He suspects a piece of cotton may be lodged in the right ear canal. He has been irrigating the right ear.  The patient also complains of persistent globus sensation and frequent coughing spells for the past 2 months.  He denies the use of tobacco.  He is unaware of any significant gastroesophageal reflux symptoms.  He denies any significant dysphagia or odynophagia.   The patient's review of systems (constitutional, eyes, ENT, cardiovascular, respiratory, GI, musculoskeletal, skin, neurologic, psychiatric, endocrine, hematologic, allergic) is noted in the ROS questionnaire.  It is reviewed with the patient.   Family health history: Hearing loss.   Major events: Steam cell transplant.   Ongoing medical problems: Multiple melanoma.   Social history: The patient is married. He denies the use of tobacco, alcohol or illegal drugs. objective   General: Communicates without difficulty, well nourished, no acute distress. Head: Normocephalic, no evidence injury, no tenderness, facial buttresses intact without stepoff. Face/sinus: No tenderness to palpation and percussion. Facial movement is normal and symmetric. Eyes: PERRL, EOMI. No scleral icterus, conjunctivae clear. Neuro: CN II exam reveals vision grossly intact.  No nystagmus at any point of gaze. Ears: Auricles well formed without lesions.  Ear canals are intact without mass or lesion.  No erythema or edema is appreciated.  The TMs are intact without fluid. Ears: The patient is noted to have a 1 cm lesion at the left concha bowl.  The appearance is worrisome for basal cell carcinoma. The patient's ear canals, tympanic membranes and middle ear spaces are all normal.  No foreign body is noted within the right ear canal. Nose: External evaluation reveals normal support and skin without lesions.  Dorsum is intact.  Anterior rhinoscopy reveals healthy pink mucosa over anterior aspect of inferior turbinates and intact septum.  No purulence noted. Oral:  Oral cavity and oropharynx are intact, symmetric, without erythema or edema.  Mucosa is moist without lesions. Neck: Full range of motion without pain.  There is no significant lymphadenopathy.  No masses palpable.  Thyroid bed within normal limits to palpation.  Parotid glands and submandibular glands equal bilaterally without mass.  Trachea is midline. Neuro:  CN 2-12 grossly intact. Gait normal.   Exam: 1.  The patient is noted to have a 1 cm lesion at the left concha bowl. The appearance is worrisome for basal cell carcinoma.  2.  The patient's ear canals, tympanic membranes and middle ear spaces are all normal.  No foreign body is noted within the right ear canal.  3.  The patient has severe posterior laryngeal edema on today's laryngoscopy examination. This may be the cause of his globus sensation and chronic cough.  No other suspicious mass or lesion is noted.  His vocal cords are mobile bilaterally.   Plan: 1.  The physical exam and laryngoscopy findings are reviewed with the patient.  2.  Ranitidine 150 mg p.o. q.h.s. Diet modifications that could affect his laryngopharyngeal reflux are also discussed.   3.  The patient will benefit from excision of  his left ear mass.  This will likely require repair with a full-thickness skin graft.  The risks, benefits, and details of the procedure are extensively reviewed with the patient.  4.  The patient would like to proceed with the procedure.

## 2018-01-31 NOTE — Anesthesia Preprocedure Evaluation (Addendum)
Anesthesia Evaluation  Patient identified by MRN, date of birth, ID band Patient awake    Reviewed: Allergy & Precautions, NPO status , Patient's Chart, lab work & pertinent test results  Airway Mallampati: III  TM Distance: >3 FB Neck ROM: Full    Dental no notable dental hx.    Pulmonary former smoker,    Pulmonary exam normal breath sounds clear to auscultation       Cardiovascular hypertension, Pt. on medications Normal cardiovascular exam Rhythm:Regular Rate:Normal  ECG: NSR, rate 63   Neuro/Psych PSYCHIATRIC DISORDERS Depression negative neurological ROS     GI/Hepatic negative GI ROS, (+)     substance abuse  ,   Endo/Other  negative endocrine ROS  Renal/GU negative Renal ROS     Musculoskeletal  (+) Arthritis , Osteoarthritis,    Abdominal (+) + obese,   Peds  Hematology HLD Multiple myeloma   Anesthesia Other Findings Left ear lesion  Reproductive/Obstetrics                            Anesthesia Physical Anesthesia Plan  ASA: III  Anesthesia Plan: General   Post-op Pain Management:    Induction: Intravenous  PONV Risk Score and Plan: 2 and Ondansetron, Dexamethasone and Treatment may vary due to age or medical condition  Airway Management Planned: LMA  Additional Equipment:   Intra-op Plan:   Post-operative Plan: Extubation in OR  Informed Consent: I have reviewed the patients History and Physical, chart, labs and discussed the procedure including the risks, benefits and alternatives for the proposed anesthesia with the patient or authorized representative who has indicated his/her understanding and acceptance.   Dental advisory given  Plan Discussed with: CRNA  Anesthesia Plan Comments:        Anesthesia Quick Evaluation

## 2018-01-31 NOTE — Anesthesia Procedure Notes (Signed)
Procedure Name: LMA Insertion Date/Time: 01/31/2018 8:38 AM Performed by: Genelle Bal, CRNA Pre-anesthesia Checklist: Patient identified, Emergency Drugs available, Suction available and Patient being monitored Patient Re-evaluated:Patient Re-evaluated prior to induction Oxygen Delivery Method: Circle system utilized Preoxygenation: Pre-oxygenation with 100% oxygen Induction Type: IV induction Ventilation: Mask ventilation without difficulty LMA: LMA inserted LMA Size: 5.0 Number of attempts: 1 Placement Confirmation: positive ETCO2 Tube secured with: Tape Dental Injury: Teeth and Oropharynx as per pre-operative assessment

## 2018-01-31 NOTE — Discharge Instructions (Addendum)
The patient may resume all his previous activities and diet. He will follow-up in my office in one week. ° ° ° °Post Anesthesia Home Care Instructions ° °Activity: °Get plenty of rest for the remainder of the day. A responsible individual must stay with you for 24 hours following the procedure.  °For the next 24 hours, DO NOT: °-Drive a car °-Operate machinery °-Drink alcoholic beverages °-Take any medication unless instructed by your physician °-Make any legal decisions or sign important papers. ° °Meals: °Start with liquid foods such as gelatin or soup. Progress to regular foods as tolerated. Avoid greasy, spicy, heavy foods. If nausea and/or vomiting occur, drink only clear liquids until the nausea and/or vomiting subsides. Call your physician if vomiting continues. ° °Special Instructions/Symptoms: °Your throat may feel dry or sore from the anesthesia or the breathing tube placed in your throat during surgery. If this causes discomfort, gargle with warm salt water. The discomfort should disappear within 24 hours. ° °If you had a scopolamine patch placed behind your ear for the management of post- operative nausea and/or vomiting: ° °1. The medication in the patch is effective for 72 hours, after which it should be removed.  Wrap patch in a tissue and discard in the trash. Wash hands thoroughly with soap and water. °2. You may remove the patch earlier than 72 hours if you experience unpleasant side effects which may include dry mouth, dizziness or visual disturbances. °3. Avoid touching the patch. Wash your hands with soap and water after contact with the patch. °  ° °

## 2018-01-31 NOTE — Transfer of Care (Signed)
Immediate Anesthesia Transfer of Care Note  Patient: Derek Castro  Procedure(s) Performed: LEFT EXCISION MASS (Left Ear) SKIN GRAFT FULL THICKNESS (Left Abdomen)  Patient Location: PACU  Anesthesia Type:General  Level of Consciousness: awake, alert  and oriented  Airway & Oxygen Therapy: Patient Spontanous Breathing and Patient connected to face mask oxygen  Post-op Assessment: Report given to RN and Post -op Vital signs reviewed and stable  Post vital signs: Reviewed and stable  Last Vitals:  Vitals Value Taken Time  BP 127/54 01/31/2018 10:05 AM  Temp    Pulse 62 01/31/2018 10:06 AM  Resp 16 01/31/2018 10:06 AM  SpO2 98 % 01/31/2018 10:06 AM  Vitals shown include unvalidated device data.  Last Pain:  Vitals:   01/31/18 0802  TempSrc: Oral  PainSc: 0-No pain      Patients Stated Pain Goal: 4 (33/61/22 4497)  Complications: No apparent anesthesia complications

## 2018-02-01 ENCOUNTER — Encounter (HOSPITAL_BASED_OUTPATIENT_CLINIC_OR_DEPARTMENT_OTHER): Payer: Self-pay | Admitting: Otolaryngology

## 2018-02-02 ENCOUNTER — Encounter: Payer: Self-pay | Admitting: Family Medicine

## 2018-02-05 DIAGNOSIS — C9000 Multiple myeloma not having achieved remission: Secondary | ICD-10-CM | POA: Diagnosis not present

## 2018-02-08 ENCOUNTER — Other Ambulatory Visit: Payer: Self-pay | Admitting: Family Medicine

## 2018-02-08 NOTE — Telephone Encounter (Signed)
RF request for acyclovir LOV: 09/08/17 Next ov: None Last written: 02/04/17 #90 w/ 3RF  Please advise. Thanks.

## 2018-02-21 DIAGNOSIS — D4989 Neoplasm of unspecified behavior of other specified sites: Secondary | ICD-10-CM | POA: Diagnosis not present

## 2018-02-21 DIAGNOSIS — C9000 Multiple myeloma not having achieved remission: Secondary | ICD-10-CM | POA: Diagnosis not present

## 2018-03-14 DIAGNOSIS — M8448XD Pathological fracture, other site, subsequent encounter for fracture with routine healing: Secondary | ICD-10-CM | POA: Diagnosis not present

## 2018-03-14 DIAGNOSIS — Z5112 Encounter for antineoplastic immunotherapy: Secondary | ICD-10-CM | POA: Diagnosis not present

## 2018-03-14 DIAGNOSIS — M48061 Spinal stenosis, lumbar region without neurogenic claudication: Secondary | ICD-10-CM | POA: Diagnosis not present

## 2018-03-14 DIAGNOSIS — C9000 Multiple myeloma not having achieved remission: Secondary | ICD-10-CM | POA: Diagnosis not present

## 2018-03-14 DIAGNOSIS — M79671 Pain in right foot: Secondary | ICD-10-CM | POA: Diagnosis not present

## 2018-03-14 DIAGNOSIS — M21071 Valgus deformity, not elsewhere classified, right ankle: Secondary | ICD-10-CM | POA: Diagnosis not present

## 2018-03-14 DIAGNOSIS — Z8546 Personal history of malignant neoplasm of prostate: Secondary | ICD-10-CM | POA: Diagnosis not present

## 2018-03-14 DIAGNOSIS — E882 Lipomatosis, not elsewhere classified: Secondary | ICD-10-CM | POA: Diagnosis not present

## 2018-03-22 DIAGNOSIS — C9000 Multiple myeloma not having achieved remission: Secondary | ICD-10-CM | POA: Diagnosis not present

## 2018-03-28 DIAGNOSIS — C9000 Multiple myeloma not having achieved remission: Secondary | ICD-10-CM | POA: Diagnosis not present

## 2018-04-02 ENCOUNTER — Other Ambulatory Visit: Payer: Self-pay | Admitting: Family Medicine

## 2018-04-02 NOTE — Telephone Encounter (Signed)
Pt advised that he is over due for f/u. Apt made for 04/17/18 at 11:30am.

## 2018-04-04 DIAGNOSIS — C9000 Multiple myeloma not having achieved remission: Secondary | ICD-10-CM | POA: Diagnosis not present

## 2018-04-11 DIAGNOSIS — C9000 Multiple myeloma not having achieved remission: Secondary | ICD-10-CM | POA: Diagnosis not present

## 2018-04-16 ENCOUNTER — Encounter: Payer: Self-pay | Admitting: *Deleted

## 2018-04-16 DIAGNOSIS — F329 Major depressive disorder, single episode, unspecified: Secondary | ICD-10-CM | POA: Insufficient documentation

## 2018-04-17 ENCOUNTER — Encounter: Payer: Self-pay | Admitting: Family Medicine

## 2018-04-17 ENCOUNTER — Ambulatory Visit (INDEPENDENT_AMBULATORY_CARE_PROVIDER_SITE_OTHER): Payer: Medicare Other | Admitting: Family Medicine

## 2018-04-17 VITALS — BP 116/69 | HR 66 | Temp 98.4°F | Resp 16 | Ht 70.0 in | Wt 255.0 lb

## 2018-04-17 DIAGNOSIS — F5101 Primary insomnia: Secondary | ICD-10-CM | POA: Diagnosis not present

## 2018-04-17 DIAGNOSIS — I10 Essential (primary) hypertension: Secondary | ICD-10-CM

## 2018-04-17 DIAGNOSIS — C9000 Multiple myeloma not having achieved remission: Secondary | ICD-10-CM | POA: Diagnosis not present

## 2018-04-17 DIAGNOSIS — R7303 Prediabetes: Secondary | ICD-10-CM | POA: Diagnosis not present

## 2018-04-17 DIAGNOSIS — E669 Obesity, unspecified: Secondary | ICD-10-CM

## 2018-04-17 DIAGNOSIS — F3342 Major depressive disorder, recurrent, in full remission: Secondary | ICD-10-CM | POA: Diagnosis not present

## 2018-04-17 LAB — BASIC METABOLIC PANEL
BUN: 19 mg/dL (ref 6–23)
CHLORIDE: 103 meq/L (ref 96–112)
CO2: 28 mEq/L (ref 19–32)
CREATININE: 1.18 mg/dL (ref 0.40–1.50)
Calcium: 9.2 mg/dL (ref 8.4–10.5)
GFR: 63.79 mL/min (ref >60.00)
Glucose, Bld: 91 mg/dL (ref 70–99)
Potassium: 4.7 mEq/L (ref 3.5–5.1)
SODIUM: 140 meq/L (ref 135–145)

## 2018-04-17 LAB — HEMOGLOBIN A1C: HEMOGLOBIN A1C: 6 % (ref 4.6–6.5)

## 2018-04-17 NOTE — Progress Notes (Signed)
OFFICE VISIT  04/17/2018   CC:  Chief Complaint  Patient presents with  . Follow-up    RCI, pt is not fasting.    HPI:    Patient is a 77 y.o. Caucasian male who presents for 6 mo f/u HTN, MDD with insomnia, and prediabetes. He has multiple myeloma and is followed by hem/onc with [facility]. He says he feels great.  He gets velcade weekly now, and the day after getting this med he feels very tired and has diarrhea.    HTN: Home bp's consistently <130/80.  MDD/insomnia: he says he thinks his mood is fine, no signif fluctuations u or down.  Sleep is fine.  Prediabetes: he has not been working on a diabetic type diet. He is not a sedentary person, but he does no formal exercise regimen.    Mult myeloma: 01/18/18 surveillance MRI of C, T, and L spine w and w/o contrast: IMPRESSION:   1. Stable chronic pathologic fracture of T5.  2. No substantial change in marrow edema and enhancement within the T4  vertebral body without compression fracture.  3. Stable multilevel degenerative changes including mild to moderate spinal  canal stenosis at L3-4 secondary to diffuse disc bulge and dorsal epidural  Lipomatosis.  He also had a bone marrow aspiration w/biopsy 01/2018--->abnl result; he now gets velcade WEEKLY at [facility].  ROS: no CP, no SOB, no wheezing, no cough, no dizziness, no HAs, no rashes, no melena/hematochezia.  No polyuria or polydipsia.  No myalgias or arthralgias.   Past Medical History:  Diagnosis Date  . Alcoholism in remission ([place])    [facility] three times per week as of 01/2017.  Marland Kitchen Bone lesion   . Depression   . Fall 01/06/2018   pt fell wearing crocs while going up muddy embankment and hit head,no LOC went to ER no fractures pt sent home  . Family history of colon cancer in mother   . Hyperlipidemia    Pt declined statin 08/2017  . Hypertension   . Insomnia    uses quetiapine for this  . Multiple myeloma ([place])    [facility]  .  Osteoarthritis    C-spine--hx of fusion surgery.  R foot. Knees (hx of L TKA)  . Prostate cancer [facility])    [facility]    Past Surgical History:  Procedure Laterality Date  . ANTERIOR FUSION CERVICAL SPINE  2002  . autologous stem cell transplant  2010 and 2012   [facility] in [place].  Marland Kitchen COLONOSCOPY  2015   Recall 5 yrs ([facility] in [place], [place])  . EXCISIONAL TOTAL KNEE ARTHROPLASTY  2014 and 2016   Both knees  . LAPAROSCOPIC RETROPUBIC PROSTATECTOMY     + hx of radiation to pelvis.  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  Procedure: LEFT EXCISION MASS;  Surgeon: Leta Baptist, MD;  Location: [place];  Service: ENT;  Laterality: Left;  . SKIN FULL THICKNESS GRAFT Left 01/31/2018   Procedure: SKIN GRAFT FULL THICKNESS;  Surgeon: Leta Baptist, MD;  Location: [place];  Service: ENT;  Laterality: Left;    Outpatient Medications Prior to Visit  Medication Sig Dispense Refill  . acyclovir (ZOVIRAX) 400 MG tablet TAKE ONE TABLET BY MOUTH DAILY 90 tablet 3  . amLODipine (NORVASC) 10 MG tablet TAKE ONE TABLET BY MOUTH DAILY 90 tablet 1  . ammonium lactate (AMLACTIN) 12 % cream Apply  at least once daily after bathing and patting dry.  99  . aspirin EC 81 MG tablet Take by mouth.    . bortezomib IV (VELCADE) 3.5 MG injection Inject into the skin.    . Fluocinolone Acetonide Body 0.01 % OIL Apply daily to affected areas.  Stop when smooth.    . fluorouracil (EFUDEX) 5 % cream Apply once daily to scalp for two weeks  2  . FLUoxetine (PROZAC) 40 MG capsule TAKE ONE CAPSULE BY MOUTH EVERY DAY 90 capsule 0  . lisinopril (PRINIVIL,ZESTRIL) 30 MG tablet Take 1 tablet (30 mg total) by mouth daily. 90 tablet 0  . ondansetron (ZOFRAN) 8 MG tablet TAKE 1 TABLET (8 MG TOTAL) BY MOUTH EVERY 8 (EIGHT) HOURS AS NEEDED FOR NAUSEA FOR UP TO 30 DOSES.  3  . QUEtiapine (SEROQUEL) 100 MG tablet Take 1 tablet (100 mg  total) by mouth at bedtime. 90 tablet 1  . zolendronic acid (ZOMETA) 4 MG/5ML injection Inject into the vein.    Marland Kitchen oxyCODONE-acetaminophen (PERCOCET) 5-325 MG tablet Take 1 tablet by mouth every 4 (four) hours as needed for severe pain. (Patient not taking: Reported on 04/17/2018) 20 tablet 0   No facility-administered medications prior to visit.     No Known Allergies  ROS As per HPI  PE: Blood pressure 116/69, pulse 66, temperature 98.4 F (36.9 C), temperature source Oral, resp. rate 16, height '5\' 10"'  (1.778 m), weight 255 lb (115.7 kg), SpO2 95 %. Gen: Alert, well appearing.  Patient is oriented to person, place, time, and situation. AFFECT: pleasant, lucid thought and speech. CV: RRR, no m/r/g.   LUNGS: CTA bilat, nonlabored resps, good aeration in all lung fields. EXT: no clubbing or cyanosis.  1+ pittinge edema in both LL's    LABS:    Chemistry      Component Value Date/Time   NA 137 09/08/2017 1023   K 4.7 09/08/2017 1023   CL 101 09/08/2017 1023   CO2 30 09/08/2017 1023   BUN 25 (H) 09/08/2017 1023   CREATININE 0.97 09/08/2017 1023      Component Value Date/Time   CALCIUM 9.4 09/08/2017 1023   ALKPHOS 58 09/08/2017 1023   AST 18 09/08/2017 1023   ALT 13 09/08/2017 1023   BILITOT 0.4 09/08/2017 1023     Lab Results  Component Value Date   WBC (H) 06/14/2017    10.9 A Manual Differential was performed and is consistent with the Automated Differential.   HGB 11.1 (L) 06/14/2017   HCT 34.0 (L) 06/14/2017   MCV 93.2 06/14/2017   PLT 251.0 06/14/2017   Lab Results  Component Value Date   CHOL 255 (H) 09/08/2017   HDL 33.40 (L) 09/08/2017   LDLCALC 184 (H) 09/08/2017   TRIG 189.0 (H) 09/08/2017   CHOLHDL 8 09/08/2017   Lab Results  Component Value Date   HGBA1C 6.0 09/08/2017    IMPRESSION AND PLAN:  1) HTN: The current medical regimen is effective;  continue present plan and medications. Lytes/cr today.  2) Prediabetes: emphasized need to  gradually change over to diabetic diet and also maximize exercise. Recheck (non fasting) glucose and also Hba1c today.  3) MDD, with severe insomnia: well controlled. Continue fluoxetine and quetiapine.  4) Multiple myeloma: 01/2018 bone marrow bx apparently showed some abnormal activity, so he is now on WEEKLY injections of his velcade.  He follows up with [facility] for this weekly.  An After Visit Summary was printed and given to  the patient.  FOLLOW UP: Return in about 6 months (around 10/16/2018) for routine chronic illness f/u.  Signed:  Crissie Sickles, MD           04/17/2018

## 2018-04-20 DIAGNOSIS — C9000 Multiple myeloma not having achieved remission: Secondary | ICD-10-CM | POA: Diagnosis not present

## 2018-04-25 DIAGNOSIS — Z5111 Encounter for antineoplastic chemotherapy: Secondary | ICD-10-CM | POA: Diagnosis not present

## 2018-04-25 DIAGNOSIS — Z9484 Stem cells transplant status: Secondary | ICD-10-CM | POA: Diagnosis not present

## 2018-04-25 DIAGNOSIS — M2011 Hallux valgus (acquired), right foot: Secondary | ICD-10-CM | POA: Diagnosis not present

## 2018-04-25 DIAGNOSIS — E882 Lipomatosis, not elsewhere classified: Secondary | ICD-10-CM | POA: Diagnosis not present

## 2018-04-25 DIAGNOSIS — C9000 Multiple myeloma not having achieved remission: Secondary | ICD-10-CM | POA: Diagnosis not present

## 2018-04-25 DIAGNOSIS — M5136 Other intervertebral disc degeneration, lumbar region: Secondary | ICD-10-CM | POA: Diagnosis not present

## 2018-04-25 DIAGNOSIS — M48061 Spinal stenosis, lumbar region without neurogenic claudication: Secondary | ICD-10-CM | POA: Diagnosis not present

## 2018-04-25 DIAGNOSIS — Z8546 Personal history of malignant neoplasm of prostate: Secondary | ICD-10-CM | POA: Diagnosis not present

## 2018-04-25 DIAGNOSIS — Z923 Personal history of irradiation: Secondary | ICD-10-CM | POA: Diagnosis not present

## 2018-04-27 ENCOUNTER — Ambulatory Visit: Payer: Self-pay | Admitting: *Deleted

## 2018-04-27 NOTE — Telephone Encounter (Signed)
Tammy PeaceBurlison) called and requested the call be routed to Dr Nani Ravens. Ok'd to route call per M. Rothrock.

## 2018-04-27 NOTE — Telephone Encounter (Signed)
Lucita Ferrara- patient's wife called- contact # [phone] Patient is chemo patient and exposed to granddaughter who tested positive for flu- can something be sent in- he is very high risk. Any 24 hour pharmacy will be fine.  Reason for Disposition . [1] Influenza EXPOSURE (Close Contact) within last 48 hours (2 days) AND [2] exposed person is HIGH RISK (e.g., age > 9 years, pregnant, HIV+, chronic medical condition)  Answer Assessment - Initial Assessment Questions 1. TYPE of EXPOSURE: "How were you exposed?" (e.g., close contact, not a close contact)     Granddaughter tested positive today for flu- patient was exposed for 4 days during visit 2. DATE of EXPOSURE: "When did the exposure occur?" (e.g., hour, days, weeks)     Yesterday morning/last night and 4 days prior 3. PREGNANCY: "Is there any chance you are pregnant?" "When was your last menstrual period?"     n/a 4. HIGH RISK for COMPLICATIONS: "Do you have any heart or lung problems? Do you have a weakened immune system?" (e.g., CHF, COPD, asthma, HIV positive, chemotherapy, renal failure, diabetes mellitus, sickle cell anemia)     chemotherapy patient 5. SYMPTOMS: "Do you have any symptoms?" (e.g., cough, fever, sore throat, difficulty breathing).     No symptoms now  Protocols used: INFLUENZA EXPOSURE-A-AH

## 2018-04-28 MED ORDER — OSELTAMIVIR PHOSPHATE 75 MG PO CAPS: 75.0000 mg | ORAL_CAPSULE | Freq: Two times a day (BID) | ORAL | 0 refills | Status: DC

## 2018-04-28 NOTE — Telephone Encounter (Signed)
Called and spoke with patient. Advised that Rx was sent to [facility]. He advised that [facility] already called in Rx. If [facility] calls, he will request that they place Rx on file just incase he needs another course.

## 2018-04-28 NOTE — Telephone Encounter (Signed)
Tamiflu called in for 5 days. If continued exposure, may need more. Please let pt's wife know we called this in. TY.

## 2018-04-28 NOTE — Addendum Note (Signed)
Addended by: Ames Coupe on: 04/28/2018 09:56 AM   Modules accepted: Orders

## 2018-05-02 DIAGNOSIS — C9000 Multiple myeloma not having achieved remission: Secondary | ICD-10-CM | POA: Diagnosis not present

## 2018-05-03 ENCOUNTER — Ambulatory Visit (INDEPENDENT_AMBULATORY_CARE_PROVIDER_SITE_OTHER): Payer: Medicare Other | Admitting: Family Medicine

## 2018-05-03 ENCOUNTER — Encounter: Payer: Self-pay | Admitting: Family Medicine

## 2018-05-03 ENCOUNTER — Telehealth: Payer: Self-pay | Admitting: Family Medicine

## 2018-05-03 VITALS — BP 142/70 | HR 73 | Temp 102.8°F | Resp 16 | Ht 70.0 in | Wt 249.0 lb

## 2018-05-03 DIAGNOSIS — T451X5A Adverse effect of antineoplastic and immunosuppressive drugs, initial encounter: Secondary | ICD-10-CM

## 2018-05-03 DIAGNOSIS — R059 Cough, unspecified: Secondary | ICD-10-CM

## 2018-05-03 DIAGNOSIS — Z79899 Other long term (current) drug therapy: Secondary | ICD-10-CM

## 2018-05-03 DIAGNOSIS — D84821 Immunodeficiency due to drugs: Secondary | ICD-10-CM

## 2018-05-03 DIAGNOSIS — R05 Cough: Secondary | ICD-10-CM | POA: Diagnosis not present

## 2018-05-03 DIAGNOSIS — J069 Acute upper respiratory infection, unspecified: Secondary | ICD-10-CM

## 2018-05-03 DIAGNOSIS — J101 Influenza due to other identified influenza virus with other respiratory manifestations: Secondary | ICD-10-CM | POA: Diagnosis not present

## 2018-05-03 DIAGNOSIS — R509 Fever, unspecified: Secondary | ICD-10-CM

## 2018-05-03 LAB — POC INFLUENZA A&B (BINAX/QUICKVUE)
INFLUENZA A, POC: NEGATIVE
Influenza B, POC: POSITIVE — AB

## 2018-05-03 MED ORDER — ZANAMIVIR 5 MG/BLISTER IN AEPB: 2.0000 | INHALATION_SPRAY | Freq: Two times a day (BID) | RESPIRATORY_TRACT | 0 refills | Status: DC

## 2018-05-03 MED ORDER — BALOXAVIR MARBOXIL(80 MG DOSE) 2 X 40 MG PO TBPK: 80.0000 mg | ORAL_TABLET | Freq: Once | ORAL | 0 refills | Status: AC

## 2018-05-03 NOTE — Telephone Encounter (Signed)
Copied from [place] [id number]. Topic: General - Other >> May 03, 2018 11:31 AM Lennox Solders wrote: Reason for CRM: April pharmacist [facility] does not have relenza disk inhaler they can order tomorrow. Pt does not want to wait. [facility] they do not [facility] in stock. Please advise

## 2018-05-03 NOTE — Progress Notes (Signed)
OFFICE VISIT  05/03/2018   CC:  Chief Complaint  Patient presents with  . URI   HPI:    Patient is a 77 y.o. Caucasian male who is on weekly velcade injections for multiple myeloma relapse who presents for respiratory symptoms. Onset about 24h ago with feeling like he couldn't get warm. + Cough (minimally productive) and ST, runny nose.  No HA. No known fever at home, but temp here today is 102.8. Got Velcade yesterday at [facility]--has gotten his usual post injection diarrhea.  Somehow someone prescribed tamiflu and he couldn't tolerate it from a GI standpoint--took only 2 pills. No SOB.  Felt nauseated last night b/c he felt nauseated.  No vomiting.  He feels quite tired today and is diffusely achy.  No abd pain.     Past Medical History:  Diagnosis Date  . Alcoholism in remission ([place])    [facility] three times per week as of 01/2017.  Marland Kitchen Bone lesion   . Depression   . Fall 01/06/2018   pt fell wearing crocs while going up muddy embankment and hit head,no LOC went to ER no fractures pt sent home  . Family history of colon cancer in mother   . Hyperlipidemia    Pt declined statin 08/2017  . Hypertension   . Insomnia    uses quetiapine for this  . Multiple myeloma ([place])    [facility]  . Osteoarthritis    C-spine--hx of fusion surgery.  R foot. Knees (hx of L TKA)  . Prostate cancer [facility])    [facility]    Past Surgical History:  Procedure Laterality Date  . ANTERIOR FUSION CERVICAL SPINE  2002  . autologous stem cell transplant  2010 and 2012   [facility] in [place].  Marland Kitchen COLONOSCOPY  2015   Recall 5 yrs ([facility] in [place], [place])  . EXCISIONAL TOTAL KNEE ARTHROPLASTY  2014 and 2016   Both knees  . LAPAROSCOPIC RETROPUBIC PROSTATECTOMY     + hx of radiation to pelvis.  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  Procedure: LEFT EXCISION MASS;  Surgeon: Leta Baptist, MD;  Location: [place];  Service: ENT;  Laterality: Left;  . SKIN FULL THICKNESS GRAFT Left 01/31/2018   Procedure: SKIN GRAFT FULL THICKNESS;  Surgeon: Leta Baptist, MD;  Location: [place];  Service: ENT;  Laterality: Left;    Outpatient Medications Prior to Visit  Medication Sig Dispense Refill  . acyclovir (ZOVIRAX) 400 MG tablet TAKE ONE TABLET BY MOUTH DAILY 90 tablet 3  . amLODipine (NORVASC) 10 MG tablet TAKE ONE TABLET BY MOUTH DAILY 90 tablet 1  . ammonium lactate (AMLACTIN) 12 % cream Apply at least once daily after bathing and patting dry.  99  . aspirin EC 81 MG tablet Take by mouth.    . bortezomib IV (VELCADE) 3.5 MG injection Inject into the skin.    . Fluocinolone Acetonide Body 0.01 % OIL Apply daily to affected areas.  Stop when smooth.    . fluorouracil (EFUDEX) 5 % cream Apply once daily to scalp for two weeks  2  . FLUoxetine (PROZAC) 40 MG capsule TAKE ONE CAPSULE BY MOUTH EVERY DAY 90 capsule 0  . lisinopril (PRINIVIL,ZESTRIL) 30 MG tablet Take 1 tablet (30 mg total) by mouth daily. 90 tablet 0  . ondansetron (ZOFRAN) 8 MG tablet TAKE 1 TABLET (8 MG TOTAL) BY  MOUTH EVERY 8 (EIGHT) HOURS AS NEEDED FOR NAUSEA FOR UP TO 30 DOSES.  3  . QUEtiapine (SEROQUEL) 100 MG tablet Take 1 tablet (100 mg total) by mouth at bedtime. 90 tablet 1  . zolendronic acid (ZOMETA) 4 MG/5ML injection Inject into the vein.    Marland Kitchen oseltamivir (TAMIFLU) 75 MG capsule Take 1 capsule (75 mg total) by mouth 2 (two) times daily for 5 days. (Patient not taking: Reported on 05/03/2018) 10 capsule 0   No facility-administered medications prior to visit.     Allergies  Allergen Reactions  . Tamiflu [Oseltamivir] Nausea And Vomiting    ROS As per HPI  PE: Blood pressure (!) 142/70, pulse 73, temperature (!) 102.8 F (39.3 C), temperature source Oral, resp. rate 16, height '5\' 10"'  (1.778 m), weight 249 lb (112.9 kg), SpO2 94 %. VS: noted--temp 102.8 Gen: alert, NAD, NONTOXIC  APPEARING. HEENT: eyes without injection, drainage, or swelling.  Ears: EACs clear, TMs with normal light reflex and landmarks.  Nose: Clear rhinorrhea, with some dried, crusty exudate adherent to mildly injected mucosa.  No purulent d/c.  No paranasal sinus TTP.  No facial swelling.  Throat and mouth without focal lesion.  No pharyngial swelling, erythema, or exudate.   Neck: supple, no LAD.   LUNGS: CTA bilat, nonlabored resps.   CV: RRR, no m/r/g. EXT: no c/c/e SKIN: no rash  LABS:   Rapid FLU : positive for type B    Chemistry      Component Value Date/Time   NA 140 04/17/2018 1159   K 4.7 04/17/2018 1159   CL 103 04/17/2018 1159   CO2 28 04/17/2018 1159   BUN 19 04/17/2018 1159   CREATININE 1.18 04/17/2018 1159      Component Value Date/Time   CALCIUM 9.2 04/17/2018 1159   ALKPHOS 58 09/08/2017 1023   AST 18 09/08/2017 1023   ALT 13 09/08/2017 1023   BILITOT 0.4 09/08/2017 1023     Lab Results  Component Value Date   WBC (H) 06/14/2017    10.9 A Manual Differential was performed and is consistent with the Automated Differential.   HGB 11.1 (L) 06/14/2017   HCT 34.0 (L) 06/14/2017   MCV 93.2 06/14/2017   PLT 251.0 06/14/2017   IMPRESSION AND PLAN:  Influenza B-->he could not tolerate the tamiflu he tried to take yesterday. High risk patient due to being on velcade for tx of multiple myeloma (most recent injection YESTERDAY). Will rx relenza diskhaler, 2 p bid x 5d. Get otc generic robitussin DM OR Mucinex DM and use as directed on the packaging for cough and congestion. Use otc generic saline nasal spray 2-3 times per day to irrigate/moisturize your nasal passages. Signs/symptoms to call or return for were reviewed and pt expressed understanding.  An After Visit Summary was printed and given to the patient.  FOLLOW UP: Return if symptoms worsen or fail to improve.  Signed:  Crissie Sickles, MD           05/03/2018

## 2018-05-03 NOTE — Telephone Encounter (Signed)
Called [facility] to see if the received Rx and to see if they could start the PA process. [facility] stated that they have a coupon card, she ran Rx with card and cost came down to $93.70.  Advised pts wife, okay per DPR. She is going to contact the pharmacy and have them get the medication ready.

## 2018-05-03 NOTE — Patient Instructions (Signed)
Get otc generic robitussin DM OR Mucinex DM and use as directed on the packaging for cough and congestion. Use otc generic saline nasal spray 2-3 times per day to irrigate/moisturize your nasal passages.   

## 2018-05-03 NOTE — Telephone Encounter (Signed)
OK. I sent in Derek Castro to [facility] b/c there is no other option.

## 2018-05-03 NOTE — Telephone Encounter (Signed)
Called [facility], [facility] [address], [facility] they do not have relenza.  Per [facility] pts insurance will not cover xofluza.  Do you want to send Rx for xofluza (they have 20mg  and 40mg  in stock) and we do a PA to try to get it covered? Or do you want [facility] to go ahead and order Relenza, both will take about the same amount of time.   Please advise. Thanks.

## 2018-05-03 NOTE — Telephone Encounter (Signed)
Please advise. Thanks.  

## 2018-05-03 NOTE — Telephone Encounter (Signed)
Ugh-->I can't believe they don't have a single dose. See if they have Xofluza (Baloxavir)-->the new flu medication.  If they don't, will you try [facility], [facility], and one of the [place] pharmacies to see if they have [place] or Xofluza ?  Let me know.-thx

## 2018-05-09 DIAGNOSIS — R42 Dizziness and giddiness: Secondary | ICD-10-CM | POA: Diagnosis not present

## 2018-05-09 DIAGNOSIS — D2322 Other benign neoplasm of skin of left ear and external auricular canal: Secondary | ICD-10-CM | POA: Diagnosis not present

## 2018-05-17 ENCOUNTER — Ambulatory Visit (INDEPENDENT_AMBULATORY_CARE_PROVIDER_SITE_OTHER): Payer: Medicare Other | Admitting: Family Medicine

## 2018-05-17 ENCOUNTER — Encounter: Payer: Self-pay | Admitting: Family Medicine

## 2018-05-17 VITALS — BP 127/69 | HR 75 | Temp 98.4°F | Resp 16 | Ht 70.0 in | Wt 242.0 lb

## 2018-05-17 DIAGNOSIS — L219 Seborrheic dermatitis, unspecified: Secondary | ICD-10-CM | POA: Diagnosis not present

## 2018-05-17 MED ORDER — FLUOCINOLONE ACETONIDE BODY 0.01 % EX OIL: TOPICAL_OIL | CUTANEOUS | 6 refills | Status: DC

## 2018-05-17 MED ORDER — KETOCONAZOLE 2 % EX SHAM: MEDICATED_SHAMPOO | CUTANEOUS | 6 refills | Status: DC

## 2018-05-17 NOTE — Progress Notes (Signed)
OFFICE VISIT  05/23/2018   CC:  Chief Complaint  Patient presents with  . Rash    on face, scalp, and ears x3 comes and goes   HPI:    Patient is a 77 y.o. Caucasian male who presents for rash. Onset at least 10 yrs ago on both cheeks/temples, then gradually began to involve areas behind ears, in nasolabial folds, on scalp, on back of neck, in ear canals, on sternum, on forehead. Mildly itchy.  Has responded to fluocinolone acetonide 0.01% oil in the past but the efficacy seems to be waning.   Past Medical History:  Diagnosis Date  . Alcoholism in remission ([place])    [facility] three times per week as of 01/2017.  Marland Kitchen Bone lesion   . Depression   . Fall 01/06/2018   pt fell wearing crocs while going up muddy embankment and hit head,no LOC went to ER no fractures pt sent home  . Family history of colon cancer in mother   . Hyperlipidemia    Pt declined statin 08/2017  . Hypertension   . Insomnia    uses quetiapine for this  . Multiple myeloma ([place])    [facility]  . Osteoarthritis    C-spine--hx of fusion surgery.  R foot. Knees (hx of L TKA)  . Prostate cancer [facility])    [facility]    Past Surgical History:  Procedure Laterality Date  . ANTERIOR FUSION CERVICAL SPINE  2002  . autologous stem cell transplant  2010 and 2012   [facility] in [place].  Marland Kitchen COLONOSCOPY  2015   Recall 5 yrs ([facility] in [place], [place])  . EXCISIONAL TOTAL KNEE ARTHROPLASTY  2014 and 2016   Both knees  . LAPAROSCOPIC RETROPUBIC PROSTATECTOMY     + hx of radiation to pelvis.  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  Procedure: LEFT EXCISION MASS;  Surgeon: Leta Baptist, MD;  Location: [place];  Service: ENT;  Laterality: Left;  . SKIN FULL THICKNESS GRAFT Left 01/31/2018   Procedure: SKIN GRAFT FULL THICKNESS;  Surgeon: Leta Baptist, MD;  Location: [place];  Service: ENT;   Laterality: Left;    Outpatient Medications Prior to Visit  Medication Sig Dispense Refill  . acyclovir (ZOVIRAX) 400 MG tablet TAKE ONE TABLET BY MOUTH DAILY 90 tablet 3  . amLODipine (NORVASC) 10 MG tablet TAKE ONE TABLET BY MOUTH DAILY 90 tablet 1  . aspirin EC 81 MG tablet Take by mouth.    . bortezomib IV (VELCADE) 3.5 MG injection Inject into the skin.    . fluorouracil (EFUDEX) 5 % cream Apply once daily to scalp for two weeks  2  . FLUoxetine (PROZAC) 40 MG capsule TAKE ONE CAPSULE BY MOUTH EVERY DAY 90 capsule 0  . lisinopril (PRINIVIL,ZESTRIL) 30 MG tablet Take 1 tablet (30 mg total) by mouth daily. 90 tablet 0  . ondansetron (ZOFRAN) 8 MG tablet TAKE 1 TABLET (8 MG TOTAL) BY MOUTH EVERY 8 (EIGHT) HOURS AS NEEDED FOR NAUSEA FOR UP TO 30 DOSES.  3  . QUEtiapine (SEROQUEL) 100 MG tablet Take 1 tablet (100 mg total) by mouth at bedtime. 90 tablet 1  . zanamivir (RELENZA DISKHALER) inhaler Inhale 2 puffs into the lungs 2 (two) times daily. 1 Inhaler 0  . zolendronic acid (ZOMETA) 4 MG/5ML injection Inject into the vein.    . Fluocinolone Acetonide Body 0.01 %  OIL Apply daily to affected areas.  Stop when smooth.    Marland Kitchen ammonium lactate (AMLACTIN) 12 % cream Apply at least once daily after bathing and patting dry.  99   No facility-administered medications prior to visit.     Allergies  Allergen Reactions  . Tamiflu [Oseltamivir] Nausea And Vomiting    ROS As per HPI  PE: Blood pressure 127/69, pulse 75, temperature 98.4 F (36.9 C), temperature source Oral, resp. rate 16, height '5\' 10"'  (1.778 m), weight 242 lb (109.8 kg), SpO2 98 %. Skin: areas involved are scalp, behind ears, in nasolabial folds, a bit over eyebrows, back hairline, both temples.   The skin is superficially flaky, a bit greasy texture, mild erythema in some areas, with occasional small papular type lesion and occ excoriation.  No tenderness.  LABS:  none  IMPRESSION AND PLAN:  Seborrheic dermatitis. Add  ketoconazole 2% shampoo every other day to affected areas. Continue fluocinolone acetonide 0.01% oil as he is currently doing.  An After Visit Summary was printed and given to the patient.  FOLLOW UP: Return if symptoms worsen or fail to improve.  Signed:  Crissie Sickles, MD           05/23/2018

## 2018-05-18 DIAGNOSIS — Z79818 Long term (current) use of other agents affecting estrogen receptors and estrogen levels: Secondary | ICD-10-CM | POA: Diagnosis not present

## 2018-05-18 DIAGNOSIS — C61 Malignant neoplasm of prostate: Secondary | ICD-10-CM | POA: Diagnosis not present

## 2018-05-18 DIAGNOSIS — N529 Male erectile dysfunction, unspecified: Secondary | ICD-10-CM | POA: Diagnosis not present

## 2018-05-24 DIAGNOSIS — C9000 Multiple myeloma not having achieved remission: Secondary | ICD-10-CM | POA: Diagnosis not present

## 2018-05-31 DIAGNOSIS — C9000 Multiple myeloma not having achieved remission: Secondary | ICD-10-CM | POA: Diagnosis not present

## 2018-06-06 DIAGNOSIS — C9000 Multiple myeloma not having achieved remission: Secondary | ICD-10-CM | POA: Diagnosis not present

## 2018-06-13 DIAGNOSIS — C9000 Multiple myeloma not having achieved remission: Secondary | ICD-10-CM | POA: Diagnosis not present

## 2018-06-14 ENCOUNTER — Other Ambulatory Visit: Payer: Self-pay | Admitting: Family Medicine

## 2018-06-20 DIAGNOSIS — L309 Dermatitis, unspecified: Secondary | ICD-10-CM | POA: Diagnosis not present

## 2018-06-20 DIAGNOSIS — L219 Seborrheic dermatitis, unspecified: Secondary | ICD-10-CM | POA: Diagnosis not present

## 2018-06-20 DIAGNOSIS — L308 Other specified dermatitis: Secondary | ICD-10-CM | POA: Diagnosis not present

## 2018-06-20 DIAGNOSIS — C9000 Multiple myeloma not having achieved remission: Secondary | ICD-10-CM | POA: Diagnosis not present

## 2018-06-27 DIAGNOSIS — R197 Diarrhea, unspecified: Secondary | ICD-10-CM | POA: Diagnosis not present

## 2018-06-27 DIAGNOSIS — Z79899 Other long term (current) drug therapy: Secondary | ICD-10-CM | POA: Diagnosis not present

## 2018-06-27 DIAGNOSIS — Z5111 Encounter for antineoplastic chemotherapy: Secondary | ICD-10-CM | POA: Diagnosis not present

## 2018-06-27 DIAGNOSIS — Z923 Personal history of irradiation: Secondary | ICD-10-CM | POA: Diagnosis not present

## 2018-06-27 DIAGNOSIS — R21 Rash and other nonspecific skin eruption: Secondary | ICD-10-CM | POA: Diagnosis not present

## 2018-06-27 DIAGNOSIS — Z949 Transplanted organ and tissue status, unspecified: Secondary | ICD-10-CM | POA: Diagnosis not present

## 2018-06-27 DIAGNOSIS — M79671 Pain in right foot: Secondary | ICD-10-CM | POA: Diagnosis not present

## 2018-06-27 DIAGNOSIS — M2011 Hallux valgus (acquired), right foot: Secondary | ICD-10-CM | POA: Diagnosis not present

## 2018-06-27 DIAGNOSIS — C9000 Multiple myeloma not having achieved remission: Secondary | ICD-10-CM | POA: Diagnosis not present

## 2018-06-27 DIAGNOSIS — Z8546 Personal history of malignant neoplasm of prostate: Secondary | ICD-10-CM | POA: Diagnosis not present

## 2018-06-27 DIAGNOSIS — Z8589 Personal history of malignant neoplasm of other organs and systems: Secondary | ICD-10-CM | POA: Diagnosis not present

## 2018-06-27 DIAGNOSIS — T451X5D Adverse effect of antineoplastic and immunosuppressive drugs, subsequent encounter: Secondary | ICD-10-CM | POA: Diagnosis not present

## 2018-06-29 ENCOUNTER — Other Ambulatory Visit: Payer: Self-pay | Admitting: Family Medicine

## 2018-06-30 ENCOUNTER — Other Ambulatory Visit: Payer: Self-pay | Admitting: Family Medicine

## 2018-07-04 DIAGNOSIS — C9000 Multiple myeloma not having achieved remission: Secondary | ICD-10-CM | POA: Diagnosis not present

## 2018-07-11 DIAGNOSIS — C9000 Multiple myeloma not having achieved remission: Secondary | ICD-10-CM | POA: Diagnosis not present

## 2018-07-18 DIAGNOSIS — C9000 Multiple myeloma not having achieved remission: Secondary | ICD-10-CM | POA: Diagnosis not present

## 2018-07-25 DIAGNOSIS — C9000 Multiple myeloma not having achieved remission: Secondary | ICD-10-CM | POA: Diagnosis not present

## 2018-07-25 DIAGNOSIS — Z8546 Personal history of malignant neoplasm of prostate: Secondary | ICD-10-CM | POA: Diagnosis not present

## 2018-07-25 DIAGNOSIS — Z7982 Long term (current) use of aspirin: Secondary | ICD-10-CM | POA: Diagnosis not present

## 2018-07-25 DIAGNOSIS — C9002 Multiple myeloma in relapse: Secondary | ICD-10-CM | POA: Diagnosis not present

## 2018-07-25 DIAGNOSIS — Z79899 Other long term (current) drug therapy: Secondary | ICD-10-CM | POA: Diagnosis not present

## 2018-07-25 DIAGNOSIS — M79671 Pain in right foot: Secondary | ICD-10-CM | POA: Diagnosis not present

## 2018-07-25 DIAGNOSIS — Z5111 Encounter for antineoplastic chemotherapy: Secondary | ICD-10-CM | POA: Diagnosis not present

## 2018-07-25 DIAGNOSIS — E882 Lipomatosis, not elsewhere classified: Secondary | ICD-10-CM | POA: Diagnosis not present

## 2018-07-25 DIAGNOSIS — Z923 Personal history of irradiation: Secondary | ICD-10-CM | POA: Diagnosis not present

## 2018-07-26 ENCOUNTER — Other Ambulatory Visit: Payer: Self-pay | Admitting: Family Medicine

## 2018-07-26 NOTE — Telephone Encounter (Signed)
Copied from [place] [phone]. Topic: Quick Communication - Rx Refill/Question >> Jul 26, 2018  2:30 PM Margot Ables wrote: Medication: QUEtiapine (SEROQUEL) 100 MG tablet  - 90 day supply needed - has about 1 wk left Has the patient contacted their pharmacy? Yes - advised to call doctor - No refills Preferred Pharmacy (with phone number or street name): [facility], [place], [place] - [address] [phone] (Phone) [phone] (Fax)

## 2018-07-26 NOTE — Telephone Encounter (Signed)
Requested medication (s) are due for refill today -yes  Requested medication (s) are on the active medication list -yes  Future visit scheduled -yes  Last refill: 10/24/17 1 RF  Notes to clinic: patient is requesting a refill of nondelegated Rx.  Requested Prescriptions  Pending Prescriptions Disp Refills   QUEtiapine (SEROQUEL) 100 MG tablet 90 tablet 1    Sig: Take 1 tablet (100 mg total) by mouth at bedtime.     Not Delegated - Psychiatry:  Antipsychotics - Second Generation (Atypical) - quetiapine Failed - 07/26/2018  2:38 PM      Failed - This refill cannot be delegated      Failed - ALT in normal range and within 180 days    ALT  Date Value Ref Range Status  09/08/2017 13 0 - 53 U/L Final         Failed - AST in normal range and within 180 days    AST  Date Value Ref Range Status  09/08/2017 18 0 - 37 U/L Final         Failed - Completed PHQ-2 or PHQ-9 in the last 360 days.      Passed - Last BP in normal range    BP Readings from Last 1 Encounters:  05/17/18 127/69         Passed - Valid encounter within last 6 months    Recent Outpatient Visits          2 months ago Seborrheic dermatitis   [place], Adrian Blackwater, MD   2 months ago Influenza B   [place], Adrian Blackwater, MD   3 months ago Essential hypertension   [place], Adrian Blackwater, MD   6 months ago Acute foreign body of right ear, initial encounter   [place], Renee A, DO   8 months ago Ingrown left big toenail   [place], Adrian Blackwater, MD      Future Appointments            In 2 months [place], Adrian Blackwater, MD [place], [facility]            Requested Prescriptions  Pending Prescriptions Disp Refills   QUEtiapine (SEROQUEL) 100 MG tablet 90 tablet 1    Sig: Take 1 tablet (100 mg total) by mouth at bedtime.     Not Delegated -  Psychiatry:  Antipsychotics - Second Generation (Atypical) - quetiapine Failed - 07/26/2018  2:38 PM      Failed - This refill cannot be delegated      Failed - ALT in normal range and within 180 days    ALT  Date Value Ref Range Status  09/08/2017 13 0 - 53 U/L Final         Failed - AST in normal range and within 180 days    AST  Date Value Ref Range Status  09/08/2017 18 0 - 37 U/L Final         Failed - Completed PHQ-2 or PHQ-9 in the last 360 days.      Passed - Last BP in normal range    BP Readings from Last 1 Encounters:  05/17/18 127/69         Passed - Valid encounter within last 6 months    Recent Outpatient Visits  2 months ago Seborrheic dermatitis   [place], Adrian Blackwater, MD   2 months ago Influenza B   [place], Adrian Blackwater, MD   3 months ago Essential hypertension   [place], Adrian Blackwater, MD   6 months ago Acute foreign body of right ear, initial encounter   [place], [place], DO   8 months ago Ingrown left big toenail   [place] [facility] [place], Adrian Blackwater, MD      Future Appointments            In 2 months McGowen, Adrian Blackwater, MD [place], [facility]

## 2018-07-27 ENCOUNTER — Other Ambulatory Visit: Payer: Self-pay | Admitting: Family Medicine

## 2018-07-27 MED ORDER — QUETIAPINE FUMARATE 100 MG PO TABS: 100.0000 mg | ORAL_TABLET | Freq: Every day | ORAL | 1 refills | Status: DC

## 2018-07-27 NOTE — Telephone Encounter (Signed)
RF request for seroquel LOV: 04/17/18 Next ov: 10/16/18 Last written: 10/24/17 #90 w/ 1RF  Please advise. Thanks.

## 2018-08-01 DIAGNOSIS — C9000 Multiple myeloma not having achieved remission: Secondary | ICD-10-CM | POA: Diagnosis not present

## 2018-08-09 DIAGNOSIS — Z79899 Other long term (current) drug therapy: Secondary | ICD-10-CM | POA: Diagnosis not present

## 2018-08-09 DIAGNOSIS — Z5111 Encounter for antineoplastic chemotherapy: Secondary | ICD-10-CM | POA: Diagnosis not present

## 2018-08-09 DIAGNOSIS — Z87891 Personal history of nicotine dependence: Secondary | ICD-10-CM | POA: Diagnosis not present

## 2018-08-09 DIAGNOSIS — Z8546 Personal history of malignant neoplasm of prostate: Secondary | ICD-10-CM | POA: Diagnosis not present

## 2018-08-09 DIAGNOSIS — Z9079 Acquired absence of other genital organ(s): Secondary | ICD-10-CM | POA: Diagnosis not present

## 2018-08-09 DIAGNOSIS — C9002 Multiple myeloma in relapse: Secondary | ICD-10-CM | POA: Diagnosis not present

## 2018-08-09 DIAGNOSIS — N529 Male erectile dysfunction, unspecified: Secondary | ICD-10-CM | POA: Diagnosis not present

## 2018-08-09 DIAGNOSIS — M48061 Spinal stenosis, lumbar region without neurogenic claudication: Secondary | ICD-10-CM | POA: Diagnosis not present

## 2018-08-09 DIAGNOSIS — C9000 Multiple myeloma not having achieved remission: Secondary | ICD-10-CM | POA: Diagnosis not present

## 2018-08-09 DIAGNOSIS — M4804 Spinal stenosis, thoracic region: Secondary | ICD-10-CM | POA: Diagnosis not present

## 2018-08-09 DIAGNOSIS — M4802 Spinal stenosis, cervical region: Secondary | ICD-10-CM | POA: Diagnosis not present

## 2018-08-09 DIAGNOSIS — M8448XA Pathological fracture, other site, initial encounter for fracture: Secondary | ICD-10-CM | POA: Diagnosis not present

## 2018-09-14 ENCOUNTER — Telehealth: Payer: Self-pay | Admitting: Family Medicine

## 2018-09-18 ENCOUNTER — Ambulatory Visit: Payer: Self-pay | Admitting: Family Medicine

## 2018-09-21 ENCOUNTER — Other Ambulatory Visit: Payer: Self-pay | Admitting: Family Medicine

## 2018-09-21 NOTE — Telephone Encounter (Signed)
Pt stated he contacted the pharmacy regarding lisinopril refill and they did not receive it on 09/14/18. Asking if this can be resent. Please advise.   [place], [place] - [address] [phone] (Phone) [phone] (Fax)

## 2018-09-21 NOTE — Telephone Encounter (Signed)
Pharamcy contacted and [facility].

## 2018-09-26 ENCOUNTER — Other Ambulatory Visit: Payer: Self-pay | Admitting: Family Medicine

## 2018-10-03 DIAGNOSIS — C9000 Multiple myeloma not having achieved remission: Secondary | ICD-10-CM | POA: Diagnosis not present

## 2018-10-03 LAB — HEPATIC FUNCTION PANEL
ALT: 14 (ref 10–40)
AST: 18 (ref 14–40)
Alkaline Phosphatase: 59 (ref 25–125)
Bilirubin, Total: 0.7

## 2018-10-03 LAB — BASIC METABOLIC PANEL
BUN: 27 — AB (ref 4–21)
Creatinine: 0.9 (ref 0.6–1.3)
Glucose: 89
Potassium: 4.2 (ref 3.4–5.3)
Sodium: 136 — AB (ref 137–147)

## 2018-10-03 LAB — CBC AND DIFFERENTIAL
HCT: 36 — AB (ref 41–53)
Hemoglobin: 11.7 — AB (ref 13.5–17.5)
Platelets: 196 (ref 150–399)
WBC: 4.6

## 2018-10-16 ENCOUNTER — Other Ambulatory Visit: Payer: Self-pay

## 2018-10-16 ENCOUNTER — Ambulatory Visit (INDEPENDENT_AMBULATORY_CARE_PROVIDER_SITE_OTHER): Payer: Medicare Other | Admitting: Family Medicine

## 2018-10-16 ENCOUNTER — Encounter: Payer: Self-pay | Admitting: Family Medicine

## 2018-10-16 DIAGNOSIS — E7800 Pure hypercholesterolemia, unspecified: Secondary | ICD-10-CM | POA: Diagnosis not present

## 2018-10-16 DIAGNOSIS — R7303 Prediabetes: Secondary | ICD-10-CM | POA: Diagnosis not present

## 2018-10-16 DIAGNOSIS — I10 Essential (primary) hypertension: Secondary | ICD-10-CM

## 2018-10-16 DIAGNOSIS — N3281 Overactive bladder: Secondary | ICD-10-CM

## 2018-10-16 MED ORDER — OXYBUTYNIN CHLORIDE ER 10 MG PO TB24: ORAL_TABLET | ORAL | 1 refills | Status: DC

## 2018-10-16 NOTE — Progress Notes (Signed)
Virtual Visit via Video Note  I connected with pt on 10/16/18 at 11:30 AM EDT by a video enabled telemedicine application and verified that I am speaking with the correct person using two identifiers.  Location patient: home Location provider:work or home office Persons participating in the virtual visit: patient, provider  I discussed the limitations of evaluation and management by telemedicine and the availability of in person appointments. The patient expressed understanding and agreed to proceed.  Telemedicine visit is a necessity given the COVID-19 restrictions in place at the current time.  HPI: 78 y/o with whom I am doing a telephone visit today (due to COVID-19 pandemic restrictions) for 6 mo f/u prediabetes, HTN, insomnia, and depression. He has multiple myeloma and is followed by [facility], most recently 10/03/18, at which time a CBC was normal except for stable low Hb at 11.7.  CMET was normal at that time as well (GFR in the 80s). He got velcade injection at that time as per his usual treatment regimen (gets these injections weekly).  Interim hx: chronic problem has him concerned-->gets up to go to the bathroom 3-4 times per night, tired/sleepy more lately. Urinates frequently during daytime as well.  Some urgency and sometimes poor stream and some straining needed to empty.  He DOES seem to feel like he empties every time though.  No dysuria. Has dry mouth, drinks sips of fluids at night to help.  Pt and wife concerned about these sx's possibly meaning he has DM. Eats large amounts of bad food choices, diet Dr. Malachi Bonds all day and not clear liquids to his wife's dismay.  HTN: home monitoring consistently 130/60s.  Same at regular visits to his hematologist's.  Insomnia and mood: doing well.  Says he is a happy camper, goes fishing, enjoys his wife and his home.  Sleep is good except for being interrupted by having to urinate 3-4 times a night.  ROS: no CP, no SOB, no wheezing, no  cough, no dizziness, no HAs, no rashes, no melena/hematochezia.  No abd pain, no n/v, no flank pain, no gross hematuria.  No myalgias or arthralgias.   Past Medical History:  Diagnosis Date  . Alcoholism in remission ([place])    [facility] three times per week as of 01/2017.  Marland Kitchen Bone lesion   . Depression   . Fall 01/06/2018   pt fell wearing crocs while going up muddy embankment and hit head,no LOC went to ER no fractures pt sent home  . Family history of colon cancer in mother   . Hyperlipidemia    Pt declined statin 08/2017  . Hypertension   . Insomnia    uses quetiapine for this  . Multiple myeloma ([place])    [facility]  . Osteoarthritis    C-spine--hx of fusion surgery.  R foot. Knees (hx of L TKA)  . Prostate cancer [facility])    [facility]    Past Surgical History:  Procedure Laterality Date  . ANTERIOR FUSION CERVICAL SPINE  2002  . autologous stem cell transplant  2010 and 2012   [facility] in [place].  Marland Kitchen COLONOSCOPY  2015   Recall 5 yrs ([facility] in [place], [place])  . EXCISIONAL TOTAL KNEE ARTHROPLASTY  2014 and 2016   Both knees  . LAPAROSCOPIC RETROPUBIC PROSTATECTOMY     + hx of radiation to pelvis.  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  Procedure: LEFT EXCISION MASS;  Surgeon: Leta Baptist, MD;  Location: [place];  Service: ENT;  Laterality: Left;  . SKIN FULL THICKNESS GRAFT Left 01/31/2018   Procedure: SKIN GRAFT FULL THICKNESS;  Surgeon: Leta Baptist, MD;  Location: [place];  Service: ENT;  Laterality: Left;    Family History  Problem Relation Age of Onset  . 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  No T/A/Ds.   Current Outpatient Medications:  .  acyclovir (ZOVIRAX) 400 MG tablet, TAKE ONE TABLET BY MOUTH DAILY, Disp: 90 tablet, Rfl: 3 .  amLODipine (NORVASC) 10 MG  tablet, TAKE ONE TABLET BY MOUTH DAILY, Disp: 90 tablet, Rfl: 1 .  aspirin EC 81 MG tablet, Take by mouth., Disp: , Rfl:  .  bortezomib IV (VELCADE) 3.5 MG injection, Inject into the skin., Disp: , Rfl:  .  Fluocinolone Acetonide Body 0.01 % OIL, 120Apply daily to affected areas.  Stop when smooth., Disp: 120 mL, Rfl: 6 .  fluorouracil (EFUDEX) 5 % cream, Apply once daily to scalp for two weeks, Disp: , Rfl: 2 .  FLUoxetine (PROZAC) 40 MG capsule, TAKE ONE CAPSULE BY MOUTH DAILY, Disp: 90 capsule, Rfl: 0 .  ketoconazole (NIZORAL) 2 % shampoo, Apply to affected areas every other day, Disp: 120 mL, Rfl: 6 .  lisinopril (ZESTRIL) 30 MG tablet, TAKE ONE TABLET BY MOUTH EVERY DAY, Disp: 30 tablet, Rfl: 0 .  ondansetron (ZOFRAN) 8 MG tablet, TAKE 1 TABLET (8 MG TOTAL) BY MOUTH EVERY 8 (EIGHT) HOURS AS NEEDED FOR NAUSEA FOR UP TO 30 DOSES., Disp: , Rfl: 3 .  QUEtiapine (SEROQUEL) 100 MG tablet, Take 1 tablet (100 mg total) by mouth at bedtime., Disp: 90 tablet, Rfl: 1 .  zanamivir (RELENZA DISKHALER) inhaler, Inhale 2 puffs into the lungs 2 (two) times daily., Disp: 1 Inhaler, Rfl: 0 .  zolendronic acid (ZOMETA) 4 MG/5ML injection, Inject into the vein., Disp: , Rfl:   EXAM:  VITALS per patient if applicable: There were no vitals taken for this visit.   GENERAL: alert, oriented, appears well and in no acute distress  HEENT: atraumatic, conjunttiva clear, no obvious abnormalities on inspection of external nose and ears  NECK: normal movements of the head and neck  LUNGS: on inspection no signs of respiratory distress, breathing rate appears normal, no obvious gross SOB, gasping or wheezing  CV: no obvious cyanosis  MS: moves all visible extremities without noticeable abnormality  PSYCH/NEURO: pleasant and cooperative, no obvious depression or anxiety, speech and thought processing grossly intact  LABS: none today  No results found for: TSH Lab Results  Component Value Date   WBC (H)  06/14/2017    10.9 A Manual Differential was performed and is consistent with the Automated Differential.   HGB 11.1 (L) 06/14/2017   HCT 34.0 (L) 06/14/2017   MCV 93.2 06/14/2017   PLT 251.0 06/14/2017   Lab Results  Component Value Date   CREATININE 1.18 04/17/2018   BUN 19 04/17/2018   NA 140 04/17/2018   K 4.7 04/17/2018   CL 103 04/17/2018   CO2 28 04/17/2018   Lab Results  Component Value Date   ALT 13 09/08/2017   AST 18 09/08/2017   ALKPHOS 58 09/08/2017   BILITOT 0.4 09/08/2017   Lab Results  Component Value Date   CHOL 255 (H) 09/08/2017   Lab Results  Component Value Date  HDL 33.40 (L) 09/08/2017   Lab Results  Component Value Date   LDLCALC 184 (H) 09/08/2017   Lab Results  Component Value Date   TRIG 189.0 (H) 09/08/2017   Lab Results  Component Value Date   CHOLHDL 8 09/08/2017   Lab Results  Component Value Date   HGBA1C 6.0 04/17/2018   ASSESSMENT AND PLAN:  Discussed the following assessment and plan:  1) HTN: The current medical regimen is effective;  continue present plan and medications. Recent lytes and renal function normal 10/03/18 at hematologist's office. Will have results abstracted into his chart.  2) Prediabetes: poor diet.  He is not very active. Suspect his urinary habits are more a result of OAB than hyperglycemia, but certainly will check HbA1c again today.  Glucose at 10/03/18 hematology f/u was normal.  3) MDD, remission + insomnia: continues to do well on fluoxetine 69m qd and quetiapine 100 mg qhs.  4) OAB (he has hx of prostatectomy for prostate ca): start trial of ditropan xl 10 mg after supper each day.  Use sugarless hard candy for dry mouth and try to limit fluids after supper every night.  5) Hyperlipidemia: has declined statins in the past.  REcheck FLP -future.  I discussed the assessment and treatment plan with the patient. The patient was provided an opportunity to ask questions and all were answered. The  patient agreed with the plan and demonstrated an understanding of the instructions.   The patient was advised to call back or seek an in-person evaluation if the symptoms worsen or if the condition fails to improve as anticipated.  Spent 15 min with pt today, with >50% of this time spent in counseling and care coordination regarding the above problems.  F/u: 2-3 wks, f/u OAB  Signed:  PCrissie Sickles MD           10/16/2018

## 2018-10-16 NOTE — Progress Notes (Signed)
Labs abstracted 

## 2018-10-17 DIAGNOSIS — C9000 Multiple myeloma not having achieved remission: Secondary | ICD-10-CM | POA: Diagnosis not present

## 2018-10-18 ENCOUNTER — Other Ambulatory Visit: Payer: Self-pay | Admitting: Family Medicine

## 2018-10-23 ENCOUNTER — Ambulatory Visit (INDEPENDENT_AMBULATORY_CARE_PROVIDER_SITE_OTHER): Payer: Medicare Other | Admitting: Family Medicine

## 2018-10-23 ENCOUNTER — Other Ambulatory Visit: Payer: Self-pay

## 2018-10-23 ENCOUNTER — Telehealth: Payer: Self-pay | Admitting: Family Medicine

## 2018-10-23 DIAGNOSIS — R7303 Prediabetes: Secondary | ICD-10-CM | POA: Diagnosis not present

## 2018-10-23 LAB — HEMOGLOBIN A1C: Hgb A1c MFr Bld: 6.1 % (ref 4.6–6.5)

## 2018-10-23 NOTE — Telephone Encounter (Signed)
OK for in-office visit tomorrow.-thx

## 2018-10-23 NOTE — Telephone Encounter (Signed)
Patient came to office today for lab only visit.  Patient requesting Dr Anitra Lauth look at a bite on his arm.  I advised patient I would send message to see if an in person OV would be possible as Dr. Anitra Lauth was unavailable and with patients.  Please advise?

## 2018-10-23 NOTE — Telephone Encounter (Signed)
Now patient is refusing OV.   He will just watch bite to see what happens.

## 2018-10-31 DIAGNOSIS — C9000 Multiple myeloma not having achieved remission: Secondary | ICD-10-CM | POA: Diagnosis not present

## 2018-11-01 ENCOUNTER — Ambulatory Visit: Payer: Medicare Other | Admitting: Family Medicine

## 2018-11-01 ENCOUNTER — Encounter: Payer: Self-pay | Admitting: Family Medicine

## 2018-11-01 ENCOUNTER — Telehealth: Payer: Self-pay | Admitting: Family Medicine

## 2018-11-01 ENCOUNTER — Ambulatory Visit (INDEPENDENT_AMBULATORY_CARE_PROVIDER_SITE_OTHER): Payer: Medicare Other | Admitting: Family Medicine

## 2018-11-01 ENCOUNTER — Other Ambulatory Visit: Payer: Self-pay

## 2018-11-01 DIAGNOSIS — N3281 Overactive bladder: Secondary | ICD-10-CM

## 2018-11-01 MED ORDER — MIRABEGRON ER 25 MG PO TB24: 25.0000 mg | ORAL_TABLET | Freq: Every day | ORAL | 0 refills | Status: DC

## 2018-11-01 MED ORDER — SOLIFENACIN SUCCINATE 5 MG PO TABS: 5.0000 mg | ORAL_TABLET | Freq: Every day | ORAL | 0 refills | Status: DC

## 2018-11-01 NOTE — Telephone Encounter (Signed)
New med ([facility]) eRx'd.

## 2018-11-01 NOTE — Telephone Encounter (Signed)
Pt was notified regarding medication. 

## 2018-11-01 NOTE — Telephone Encounter (Signed)
Seen Dr. Anitra Lauth today. Meds that were prescribed to him today is too expensive $400 with insurance - patient refused  Please send something else in   [facility]   Thank you

## 2018-11-01 NOTE — Progress Notes (Signed)
Virtual Visit via Video Note  I connected with pt on 11/01/18 at 10:20 AM EDT by telephone (pt w/out technology necessary for video visit) and verified that I am speaking with the correct person using two identifiers.  Location patient: home Location provider:work or home office Persons participating in the virtual visit: patient, provider  I discussed the limitations of evaluation and management by telemedicine/telephone and the availability of in person appointments. The patient expressed understanding and agreed to proceed.  Telemedicine/telephone visit is a necessity given the COVID-19 restrictions in place at the current time.  HPI: 78 y/o WM with whom I am doing a telephone visit today (due to COVID-19 pandemic restrictions) for 3 wk f/u OAB. I rx'd trial of oxybutynin xl 23m w/supper each day. Advised him to limit fluid intake after supper every night.  Interim hx: No change.  Daytime and nighttime frequency. Nocturia x 3-4. Empties bladder fine. Poor stream.   No urine leakage. No side effects from the ditropan. He is limiting his fluids after supper.   No blood in urine.   ROS: See pertinent positives and negatives per HPI.  Past Medical History:  Diagnosis Date  . [place]    [facility] three times per week as of 01/2017.  .Marland KitchenBone lesion   . Depression   . Fall 01/06/2018   pt fell wearing crocs while going up muddy embankment and hit head,no LOC went to ER no fractures pt sent home  . Family history of colon cancer in mother   . Hyperlipidemia    Pt declined statin 08/2017  . Hypertension   . Insomnia    uses quetiapine for this  . [place]    [facility]  . Osteoarthritis    C-spine--hx of fusion surgery.  R foot. Knees (hx of L TKA)  . Prediabetes   . Prostate [facility] ([facility]    [facility]    Past Surgical History:  Procedure Laterality Date  . ANTERIOR FUSION CERVICAL SPINE   2002  . [facility]  [facility] [place]  .Marland KitchenCOLONOSCOPY  2015   [place] [place] [place]  . EXCISIONAL TOTAL KNEE ARTHROPLASTY  2014 and 2016   Both knees  . LAPAROSCOPIC RETROPUBIC PROSTATECTOMY     + hx of radiation to pelvis.  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  Procedure: LEFT EXCISION MASS;  Surgeon: TLeta Baptist MD;  [place]  Service: ENT;  Laterality: Left;  . SKIN FULL THICKNESS GRAFT Left 01/31/2018   Procedure: SKIN GRAFT FULL THICKNESS;  Surgeon: TLeta Baptist MD;  [place]  Service: ENT;  Laterality: Left;    Family History  Problem Relation Age of Onset  . Cancer Mother        colon    SOCIAL HX: Mar[place], 3 children[place], no T/[place]/Ds.   Current Outpatient Medications:  .  acyclovir (ZOVIRAX) 400 MG tablet, TAKE ONE TABLET BY MOUTH DAILY, Disp: 90 tablet, Rfl: 3 .  amLODipine (NORVASC) 10 MG tablet, TAKE ONE TABLET BY MOUTH DAILY, Disp: 90 tablet, Rfl: 1 .  aspirin EC 81 MG tablet, Take by mouth., Disp: , Rfl:  .  bortezomib IV (VELCADE) 3.5 MG injection, Inject into the skin., Disp: , Rfl:  .  Fluocinolone Acetonide Body 0.01 %  OIL, 120Apply daily to affected areas.  Stop when smooth., Disp: 120 mL, Rfl: 6 .  fluorouracil (EFUDEX) 5 % cream, Apply once daily to scalp for two weeks, Disp: , Rfl: 2 .  FLUoxetine (PROZAC) 40 MG capsule, TAKE ONE CAPSULE BY MOUTH DAILY, Disp: 90 capsule, Rfl: 0 .  ketoconazole (NIZORAL) 2 % shampoo, Apply to affected areas every other day, Disp: 120 mL, Rfl: 6 .  lisinopril (ZESTRIL) 30 MG tablet, TAKE ONE TABLET BY MOUTH EVERY DAY, Disp: 30 tablet, Rfl: 0 .  ondansetron (ZOFRAN) 8 MG tablet, TAKE 1 TABLET (8 MG TOTAL) BY MOUTH EVERY 8 (EIGHT) HOURS AS NEEDED FOR NAUSEA FOR UP TO 30 DOSES., Disp: , Rfl: 3 .  oxybutynin (DITROPAN-XL) 10 MG 24 hr tablet, 1 tab po  after your evening meal every day to help with overactive bladder, Disp: 30 tablet, Rfl: 1 .  QUEtiapine (SEROQUEL) 100 MG tablet, Take 1 tablet (100 mg total) by mouth at bedtime., Disp: 90 tablet, Rfl: 1 .  zanamivir (RELENZA DISKHALER) inhaler, Inhale 2 puffs into the lungs 2 (two) times daily., Disp: 1 Inhaler, Rfl: 0 .  zolendronic acid (ZOMETA) 4 MG/5ML injection, Inject into the vein., Disp: , Rfl:   EXAM:  VITALS per patient if applicable: There were no vitals taken for this visit.   GENERAL: alert, pleasant, lucid thought and speech. No further exam b/c this was a telephone visit.  LABS: none today  Lab Results  Component Value Date   HGBA1C 6.1 10/23/2018     Chemistry      Component Value Date/Time   NA 136 (A) 10/03/2018   K 4.2 10/03/2018   CL 103 04/17/2018 1159   CO2 28 04/17/2018 1159   BUN 27 (A) 10/03/2018   CREATININE 0.9 10/03/2018   CREATININE 1.18 04/17/2018 1159   GLU 89 10/03/2018      Component Value Date/Time   CALCIUM 9.2 04/17/2018 1159   ALKPHOS 59 10/03/2018   AST 18 10/03/2018   ALT 14 10/03/2018   BILITOT 0.4 09/08/2017 1023      ASSESSMENT AND PLAN:  Discussed the following assessment and plan:  1) OAB.  The thing that bothers him the most is the nocturia x 3-4 every single night despite limiting evening fluid intake. D/c oxybutinyn since no diff felt with 71m evening dose. Start trial of myrbetriq 260mqd. We'll see if he's seeing any improvement in 1 mo.   I discussed the assessment and treatment plan with the patient. The patient was provided an opportunity to ask questions and all were answered. The patient agreed with the plan and demonstrated an understanding of the instructions.   The patient was advised to call back or seek an in-person evaluation if the symptoms worsen or if the condition fails to improve as anticipated.  Spent 10 min with pt today, with >50% of this time spent in counseling and care coordination  regarding the above problems.  F/u: 1 mo f/u OAB  Signed:  PhCrissie SicklesMD           11/01/2018

## 2018-11-08 ENCOUNTER — Other Ambulatory Visit: Payer: Self-pay | Admitting: Family Medicine

## 2018-11-14 DIAGNOSIS — C9000 Multiple myeloma not having achieved remission: Secondary | ICD-10-CM | POA: Diagnosis not present

## 2018-11-20 ENCOUNTER — Other Ambulatory Visit: Payer: Self-pay | Admitting: Family Medicine

## 2018-11-28 DIAGNOSIS — C9000 Multiple myeloma not having achieved remission: Secondary | ICD-10-CM | POA: Diagnosis not present

## 2018-12-03 ENCOUNTER — Other Ambulatory Visit: Payer: Self-pay

## 2018-12-03 ENCOUNTER — Encounter: Payer: Self-pay | Admitting: Family Medicine

## 2018-12-03 ENCOUNTER — Ambulatory Visit (INDEPENDENT_AMBULATORY_CARE_PROVIDER_SITE_OTHER): Payer: Medicare Other | Admitting: Family Medicine

## 2018-12-03 DIAGNOSIS — N3281 Overactive bladder: Secondary | ICD-10-CM | POA: Diagnosis not present

## 2018-12-03 DIAGNOSIS — Z789 Other specified health status: Secondary | ICD-10-CM | POA: Diagnosis not present

## 2018-12-03 MED ORDER — SOLIFENACIN SUCCINATE 10 MG PO TABS: 10.0000 mg | ORAL_TABLET | Freq: Every day | ORAL | 1 refills | Status: DC

## 2018-12-03 NOTE — Progress Notes (Signed)
Virtual Visit via Video Note  I connected with pt on 12/03/18 at 11:00 AM EDT by telephone (pt lacks the technology necessary for video telemedicine encounter) and verified that I am speaking with the correct person using two identifiers.  Location patient: home Location provider:work or home office Persons participating in the virtual visit: patient, pt's wife, and myself.  I discussed the limitations of evaluation and management by telephone and the availability of in person appointments. The patient expressed understanding and agreed to proceed.  Telephone visit is a necessity given the COVID-19 restrictions in place at the current time.  HPI: 78 y/o WM with whom I am doing a telephone visit today (due to COVID-19 pandemic restrictions) for 1 mo f/u overactive bladder. I rx'd myrbetriq last visit but he called back stating that it was too expensive. I rx'd vesicare 62m qd at that time.  Interim hx: He can't tell any difference.   He has to urinate q2-3 hours overnight, same frequency during the daytime.   He does feel pressure.  Occ leakage + urgency. He denies any side effects. He drinks at least 3 of the 12 oz diet Dr. PSamson Fredericper day per his report but his wife is in the background saying he drinks at least 5 or 6 per day.  He does drink one AFTER supper. We discussed the fact that caffeine acts as a diuretic AND a bladder irritant/stimulant.   ROS: See pertinent positives and negatives per HPI.  Past Medical History:  Diagnosis Date  . Alcoholism in remission ([place]    [facility] three times per week as of 01/2017.  .Marland KitchenBone lesion   . Depression   . Fall 01/06/2018   pt fell wearing crocs while going up muddy embankment and hit head,no LOC went to ER no fractures pt sent home  . Family history of colon cancer in mother   . Hyperlipidemia    Pt declined statin 08/2017  . Hypertension   . Insomnia    uses quetiapine for this  . [place] myeloma ([place]    [facility]  . Osteoarthritis    C-spine--hx of fusion surgery.  R foot. Knees (hx of L TKA)  . Prediabetes   . Prostate cancer ([facility]    [facility]    Past Surgical History:  Procedure Laterality Date  . ANTERIOR FUSION CERVICAL SPINE  2002  . [facility]  [facility] [place]  .Marland KitchenCOLONOSCOPY  2015   [place]  . EXCISIONAL TOTAL KNEE ARTHROPLASTY  2014 and 2016   Both knees  . LAPAROSCOPIC RETROPUBIC PROSTATECTOMY     + hx of radiation to pelvis.  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  Procedure: LEFT EXCISION MASS;  Surgeon: TLeta Baptist MD;  [facility]  Service: ENT;  Laterality: Left;  . SKIN FULL THICKNESS GRAFT Left 01/31/2018   Procedure: SKIN GRAFT FULL THICKNESS;  Surgeon: TLeta Baptist MD;  [place] [facility]  Service: ENT;  Laterality: Left;    Family History  Problem Relation Age of Onset  . Cancer Mother        colon     Current Outpatient Medications:  .  acyclovir (ZOVIRAX) 400 MG tablet, TAKE ONE TABLET BY MOUTH DAILY, Disp: 90 tablet, Rfl: 0 .  amLODipine (NORVASC) 10 MG tablet, TAKE  ONE TABLET BY MOUTH DAILY, Disp: 90 tablet, Rfl: 1 .  aspirin EC 81 MG tablet, Take by mouth., Disp: , Rfl:  .  bortezomib IV (VELCADE) 3.5 MG injection, Inject into the skin., Disp: , Rfl:  .  Fluocinolone Acetonide Body 0.01 % OIL, 120Apply daily to affected areas.  Stop when smooth., Disp: 120 mL, Rfl: 6 .  fluorouracil (EFUDEX) 5 % cream, Apply once daily to scalp for two weeks, Disp: , Rfl: 2 .  FLUoxetine (PROZAC) 40 MG capsule, TAKE ONE CAPSULE BY MOUTH DAILY, Disp: 90 capsule, Rfl: 0 .  ketoconazole (NIZORAL) 2 % shampoo, Apply to affected areas every other day, Disp: 120 mL, Rfl: 6 .  lisinopril (ZESTRIL) 30 MG tablet, TAKE ONE TABLET BY MOUTH DAILY, Disp: 30 tablet, Rfl: 0 .  ondansetron  (ZOFRAN) 8 MG tablet, TAKE 1 TABLET (8 MG TOTAL) BY MOUTH EVERY 8 (EIGHT) HOURS AS NEEDED FOR NAUSEA FOR UP TO 30 DOSES., Disp: , Rfl: 3 .  QUEtiapine (SEROQUEL) 100 MG tablet, Take 1 tablet (100 mg total) by mouth at bedtime., Disp: 90 tablet, Rfl: 1 .  solifenacin (VESICARE) 5 MG tablet, Take 1 tablet (5 mg total) by mouth daily., Disp: 30 tablet, Rfl: 0 .  zolendronic acid (ZOMETA) 4 MG/5ML injection, Inject into the vein., Disp: , Rfl:   EXAM:  VITALS per patient if applicable: There were no vitals taken for this visit.   GENERAL: alert, oriented, pleasant, lucid thought and speech. No further exam today b/c it was a telephone encounter.  LABS: none today  Lab Results  Component Value Date   WBC 4.6 10/03/2018   HGB 11.7 (A) 10/03/2018   HCT 36 (A) 10/03/2018   MCV 93.2 06/14/2017   PLT 196 10/03/2018     Chemistry      Component Value Date/Time   NA 136 (A) 10/03/2018   K 4.2 10/03/2018   CL 103 04/17/2018 1159   CO2 28 04/17/2018 1159   BUN 27 (A) 10/03/2018   CREATININE 0.9 10/03/2018   CREATININE 1.18 04/17/2018 1159   GLU 89 10/03/2018      Component Value Date/Time   CALCIUM 9.2 04/17/2018 1159   ALKPHOS 59 10/03/2018   AST 18 10/03/2018   ALT 14 10/03/2018   BILITOT 0.4 09/08/2017 1023      ASSESSMENT AND PLAN:  Discussed the following assessment and plan:  OAB: there is a larger factor affecting his urinary frequency, urgency, and nocturia that initially thought. His caffeine intake is immense: diet Dr. Malachi Bonds. I recommended he stop his evening 12 oz Dr. Malachi Bonds first. Then after 1 wk of this, cut out one of his daytime 12 oz Dr. Samson Frederic. Trying to avoid caffeine w/drawal HAs/irritability. Will also increase his vesicare to 82m qd.   I discussed the assessment and treatment plan with the patient. The patient was provided an opportunity to ask questions and all were answered. The patient agreed with the plan and demonstrated an understanding of the  instructions.   The patient was advised to call back or seek an in-person evaluation if the symptoms worsen or if the condition fails to improve as anticipated.  Spent 15 min with pt today, with >50% of this time spent in counseling and care coordination regarding the above problems.  F/u: 1 mo  Signed:  PCrissie Sickles MD           12/03/2018

## 2018-12-12 DIAGNOSIS — Z923 Personal history of irradiation: Secondary | ICD-10-CM | POA: Diagnosis not present

## 2018-12-12 DIAGNOSIS — Z5111 Encounter for antineoplastic chemotherapy: Secondary | ICD-10-CM | POA: Diagnosis not present

## 2018-12-12 DIAGNOSIS — Z8546 Personal history of malignant neoplasm of prostate: Secondary | ICD-10-CM | POA: Diagnosis not present

## 2018-12-12 DIAGNOSIS — Z79899 Other long term (current) drug therapy: Secondary | ICD-10-CM | POA: Diagnosis not present

## 2018-12-12 DIAGNOSIS — C9000 Multiple myeloma not having achieved remission: Secondary | ICD-10-CM | POA: Diagnosis not present

## 2018-12-12 DIAGNOSIS — R197 Diarrhea, unspecified: Secondary | ICD-10-CM | POA: Diagnosis not present

## 2018-12-12 DIAGNOSIS — M79671 Pain in right foot: Secondary | ICD-10-CM | POA: Diagnosis not present

## 2018-12-24 ENCOUNTER — Other Ambulatory Visit: Payer: Self-pay | Admitting: Family Medicine

## 2018-12-27 ENCOUNTER — Other Ambulatory Visit: Payer: Self-pay | Admitting: Family Medicine

## 2019-01-09 DIAGNOSIS — C9000 Multiple myeloma not having achieved remission: Secondary | ICD-10-CM | POA: Diagnosis not present

## 2019-01-09 DIAGNOSIS — Z5111 Encounter for antineoplastic chemotherapy: Secondary | ICD-10-CM | POA: Diagnosis not present

## 2019-01-18 DIAGNOSIS — C9000 Multiple myeloma not having achieved remission: Secondary | ICD-10-CM | POA: Diagnosis not present

## 2019-01-22 ENCOUNTER — Other Ambulatory Visit: Payer: Self-pay

## 2019-01-22 MED ORDER — LISINOPRIL 30 MG PO TABS: 30.0000 mg | ORAL_TABLET | Freq: Every day | ORAL | 0 refills | Status: DC

## 2019-01-31 DIAGNOSIS — M8448XD Pathological fracture, other site, subsequent encounter for fracture with routine healing: Secondary | ICD-10-CM | POA: Diagnosis not present

## 2019-01-31 DIAGNOSIS — C9000 Multiple myeloma not having achieved remission: Secondary | ICD-10-CM | POA: Diagnosis not present

## 2019-01-31 DIAGNOSIS — M48061 Spinal stenosis, lumbar region without neurogenic claudication: Secondary | ICD-10-CM | POA: Diagnosis not present

## 2019-02-02 DIAGNOSIS — C7951 Secondary malignant neoplasm of bone: Secondary | ICD-10-CM | POA: Insufficient documentation

## 2019-02-06 DIAGNOSIS — Z923 Personal history of irradiation: Secondary | ICD-10-CM | POA: Diagnosis not present

## 2019-02-06 DIAGNOSIS — M549 Dorsalgia, unspecified: Secondary | ICD-10-CM | POA: Diagnosis not present

## 2019-02-06 DIAGNOSIS — C9002 Multiple myeloma in relapse: Secondary | ICD-10-CM | POA: Diagnosis not present

## 2019-02-06 DIAGNOSIS — E882 Lipomatosis, not elsewhere classified: Secondary | ICD-10-CM | POA: Diagnosis not present

## 2019-02-06 DIAGNOSIS — Z8546 Personal history of malignant neoplasm of prostate: Secondary | ICD-10-CM | POA: Diagnosis not present

## 2019-02-06 DIAGNOSIS — Z5111 Encounter for antineoplastic chemotherapy: Secondary | ICD-10-CM | POA: Diagnosis not present

## 2019-02-06 DIAGNOSIS — M4804 Spinal stenosis, thoracic region: Secondary | ICD-10-CM | POA: Diagnosis not present

## 2019-02-06 DIAGNOSIS — M5136 Other intervertebral disc degeneration, lumbar region: Secondary | ICD-10-CM | POA: Diagnosis not present

## 2019-02-06 DIAGNOSIS — M79671 Pain in right foot: Secondary | ICD-10-CM | POA: Diagnosis not present

## 2019-02-06 DIAGNOSIS — M2011 Hallux valgus (acquired), right foot: Secondary | ICD-10-CM | POA: Diagnosis not present

## 2019-02-06 DIAGNOSIS — M48061 Spinal stenosis, lumbar region without neurogenic claudication: Secondary | ICD-10-CM | POA: Diagnosis not present

## 2019-02-06 DIAGNOSIS — C9000 Multiple myeloma not having achieved remission: Secondary | ICD-10-CM | POA: Diagnosis not present

## 2019-02-20 DIAGNOSIS — C9000 Multiple myeloma not having achieved remission: Secondary | ICD-10-CM | POA: Diagnosis not present

## 2019-02-20 DIAGNOSIS — M2012 Hallux valgus (acquired), left foot: Secondary | ICD-10-CM | POA: Diagnosis not present

## 2019-02-20 DIAGNOSIS — Z8546 Personal history of malignant neoplasm of prostate: Secondary | ICD-10-CM | POA: Diagnosis not present

## 2019-02-20 DIAGNOSIS — Z79899 Other long term (current) drug therapy: Secondary | ICD-10-CM | POA: Diagnosis not present

## 2019-02-20 DIAGNOSIS — M79671 Pain in right foot: Secondary | ICD-10-CM | POA: Diagnosis not present

## 2019-02-20 DIAGNOSIS — Z5112 Encounter for antineoplastic immunotherapy: Secondary | ICD-10-CM | POA: Diagnosis not present

## 2019-02-20 DIAGNOSIS — M2011 Hallux valgus (acquired), right foot: Secondary | ICD-10-CM | POA: Diagnosis not present

## 2019-02-20 DIAGNOSIS — Z5111 Encounter for antineoplastic chemotherapy: Secondary | ICD-10-CM | POA: Diagnosis not present

## 2019-03-04 DIAGNOSIS — C9000 Multiple myeloma not having achieved remission: Secondary | ICD-10-CM | POA: Diagnosis not present

## 2019-03-13 DIAGNOSIS — C9000 Multiple myeloma not having achieved remission: Secondary | ICD-10-CM | POA: Diagnosis not present

## 2019-03-20 DIAGNOSIS — Z923 Personal history of irradiation: Secondary | ICD-10-CM | POA: Diagnosis not present

## 2019-03-20 DIAGNOSIS — M2011 Hallux valgus (acquired), right foot: Secondary | ICD-10-CM | POA: Diagnosis not present

## 2019-03-20 DIAGNOSIS — C9000 Multiple myeloma not having achieved remission: Secondary | ICD-10-CM | POA: Diagnosis not present

## 2019-03-20 DIAGNOSIS — Z5111 Encounter for antineoplastic chemotherapy: Secondary | ICD-10-CM | POA: Diagnosis not present

## 2019-03-20 DIAGNOSIS — Z8546 Personal history of malignant neoplasm of prostate: Secondary | ICD-10-CM | POA: Diagnosis not present

## 2019-03-20 DIAGNOSIS — R110 Nausea: Secondary | ICD-10-CM | POA: Diagnosis not present

## 2019-03-20 DIAGNOSIS — M899 Disorder of bone, unspecified: Secondary | ICD-10-CM | POA: Diagnosis not present

## 2019-03-20 DIAGNOSIS — M48061 Spinal stenosis, lumbar region without neurogenic claudication: Secondary | ICD-10-CM | POA: Diagnosis not present

## 2019-03-20 DIAGNOSIS — C9002 Multiple myeloma in relapse: Secondary | ICD-10-CM | POA: Diagnosis not present

## 2019-03-20 DIAGNOSIS — Z79899 Other long term (current) drug therapy: Secondary | ICD-10-CM | POA: Diagnosis not present

## 2019-03-20 DIAGNOSIS — M79671 Pain in right foot: Secondary | ICD-10-CM | POA: Diagnosis not present

## 2019-03-20 DIAGNOSIS — E882 Lipomatosis, not elsewhere classified: Secondary | ICD-10-CM | POA: Diagnosis not present

## 2019-03-20 DIAGNOSIS — Z8701 Personal history of pneumonia (recurrent): Secondary | ICD-10-CM | POA: Diagnosis not present

## 2019-03-20 DIAGNOSIS — Z5112 Encounter for antineoplastic immunotherapy: Secondary | ICD-10-CM | POA: Diagnosis not present

## 2019-03-20 DIAGNOSIS — G4700 Insomnia, unspecified: Secondary | ICD-10-CM | POA: Diagnosis not present

## 2019-03-28 DIAGNOSIS — G8929 Other chronic pain: Secondary | ICD-10-CM | POA: Diagnosis not present

## 2019-03-28 DIAGNOSIS — M545 Low back pain: Secondary | ICD-10-CM | POA: Diagnosis not present

## 2019-03-28 DIAGNOSIS — C9002 Multiple myeloma in relapse: Secondary | ICD-10-CM | POA: Diagnosis not present

## 2019-04-05 ENCOUNTER — Other Ambulatory Visit: Payer: Self-pay | Admitting: Family Medicine

## 2019-04-07 DIAGNOSIS — G8929 Other chronic pain: Secondary | ICD-10-CM | POA: Diagnosis not present

## 2019-04-07 DIAGNOSIS — C9002 Multiple myeloma in relapse: Secondary | ICD-10-CM | POA: Diagnosis not present

## 2019-04-07 DIAGNOSIS — M545 Low back pain: Secondary | ICD-10-CM | POA: Diagnosis not present

## 2019-04-07 DIAGNOSIS — M8448XA Pathological fracture, other site, initial encounter for fracture: Secondary | ICD-10-CM | POA: Diagnosis not present

## 2019-04-07 DIAGNOSIS — M48061 Spinal stenosis, lumbar region without neurogenic claudication: Secondary | ICD-10-CM | POA: Diagnosis not present

## 2019-04-17 DIAGNOSIS — C9000 Multiple myeloma not having achieved remission: Secondary | ICD-10-CM | POA: Diagnosis not present

## 2019-04-17 DIAGNOSIS — R768 Other specified abnormal immunological findings in serum: Secondary | ICD-10-CM | POA: Diagnosis not present

## 2019-04-18 DIAGNOSIS — R768 Other specified abnormal immunological findings in serum: Secondary | ICD-10-CM | POA: Insufficient documentation

## 2019-04-22 ENCOUNTER — Other Ambulatory Visit: Payer: Self-pay | Admitting: Family Medicine

## 2019-04-24 DIAGNOSIS — C9000 Multiple myeloma not having achieved remission: Secondary | ICD-10-CM | POA: Diagnosis not present

## 2019-04-30 ENCOUNTER — Ambulatory Visit: Payer: Medicare Other | Admitting: Family Medicine

## 2019-05-02 DIAGNOSIS — C9000 Multiple myeloma not having achieved remission: Secondary | ICD-10-CM | POA: Diagnosis not present

## 2019-05-08 DIAGNOSIS — C9000 Multiple myeloma not having achieved remission: Secondary | ICD-10-CM | POA: Diagnosis not present

## 2019-05-13 ENCOUNTER — Other Ambulatory Visit: Payer: Self-pay | Admitting: Family Medicine

## 2019-05-31 DIAGNOSIS — R310 Gross hematuria: Secondary | ICD-10-CM

## 2019-05-31 DIAGNOSIS — C9000 Multiple myeloma not having achieved remission: Secondary | ICD-10-CM | POA: Diagnosis not present

## 2019-05-31 HISTORY — DX: Gross hematuria: R31.0

## 2019-06-12 DIAGNOSIS — C9000 Multiple myeloma not having achieved remission: Secondary | ICD-10-CM | POA: Diagnosis not present

## 2019-06-12 LAB — CBC: RBC: 4.04 (ref 3.87–5.11)

## 2019-06-12 LAB — HEPATIC FUNCTION PANEL
ALT: 19 (ref 10–40)
AST: 20 (ref 14–40)
Alkaline Phosphatase: 61 (ref 25–125)
Bilirubin, Total: 0.4

## 2019-06-12 LAB — COMPREHENSIVE METABOLIC PANEL
Albumin: 4.5 (ref 3.5–5.0)
Calcium: 9.4 (ref 8.7–10.7)

## 2019-06-12 LAB — BASIC METABOLIC PANEL
BUN: 27 — AB (ref 4–21)
CO2: 25 — AB (ref 13–22)
Chloride: 100 (ref 99–108)
Creatinine: 1.2 (ref 0.6–1.3)
Glucose: 93
Potassium: 4.6 (ref 3.4–5.3)
Sodium: 135 — AB (ref 137–147)

## 2019-06-12 LAB — CBC AND DIFFERENTIAL
HCT: 37 — AB (ref 41–53)
Hemoglobin: 12 — AB (ref 13.5–17.5)
Platelets: 229 (ref 150–399)
WBC: 6.5

## 2019-06-14 ENCOUNTER — Other Ambulatory Visit: Payer: Self-pay | Admitting: Family Medicine

## 2019-06-14 NOTE — Telephone Encounter (Signed)
RF request for Acyclovir LOV: 12/03/18 Next ov: advised to f/u 1 mo. Last written: 05/13/19 (30,0)  Please advise. Medication pending

## 2019-06-18 ENCOUNTER — Telehealth: Payer: Self-pay | Admitting: Family Medicine

## 2019-06-18 NOTE — Telephone Encounter (Signed)
Retrieved voicemail from patient  He requested to speak to someone regarding blood in his urine.  Patient can be reached at [phone]

## 2019-06-18 NOTE — Telephone Encounter (Signed)
Appointment scheduled for patient to see Dr Anitra Lauth to discuss.

## 2019-06-20 ENCOUNTER — Ambulatory Visit (INDEPENDENT_AMBULATORY_CARE_PROVIDER_SITE_OTHER): Payer: Medicare Other | Admitting: Family Medicine

## 2019-06-20 ENCOUNTER — Other Ambulatory Visit: Payer: Self-pay

## 2019-06-20 ENCOUNTER — Encounter: Payer: Self-pay | Admitting: Family Medicine

## 2019-06-20 VITALS — BP 131/76 | HR 62 | Temp 98.7°F | Resp 16 | Ht 70.0 in | Wt 253.0 lb

## 2019-06-20 DIAGNOSIS — Z79899 Other long term (current) drug therapy: Secondary | ICD-10-CM

## 2019-06-20 DIAGNOSIS — R310 Gross hematuria: Secondary | ICD-10-CM

## 2019-06-20 DIAGNOSIS — C9000 Multiple myeloma not having achieved remission: Secondary | ICD-10-CM

## 2019-06-20 DIAGNOSIS — R319 Hematuria, unspecified: Secondary | ICD-10-CM

## 2019-06-20 LAB — BASIC METABOLIC PANEL
BUN: 17 mg/dL (ref 6–23)
CO2: 29 mEq/L (ref 19–32)
Calcium: 9.2 mg/dL (ref 8.4–10.5)
Chloride: 102 mEq/L (ref 96–112)
Creatinine, Ser: 0.89 mg/dL (ref 0.40–1.50)
GFR: 82.85 mL/min (ref >60.00)
Glucose, Bld: 84 mg/dL (ref 70–99)
Potassium: 4.4 mEq/L (ref 3.5–5.1)
Sodium: 137 mEq/L (ref 135–145)

## 2019-06-20 LAB — POCT URINALYSIS DIPSTICK
Bilirubin, UA: NEGATIVE
Blood, UA: NEGATIVE
Glucose, UA: NEGATIVE
Leukocytes, UA: NEGATIVE
Nitrite, UA: NEGATIVE
Protein, UA: NEGATIVE
Spec Grav, UA: 1.025 (ref 1.010–1.025)
Urobilinogen, UA: 0.2 E.U./dL
pH, UA: 6 (ref 5.0–8.0)

## 2019-06-20 LAB — CBC WITH DIFFERENTIAL/PLATELET
Basophils Absolute: 0 10*3/uL (ref 0.0–0.1)
Basophils Relative: 0.3 % (ref 0.0–3.0)
Eosinophils Absolute: 0.1 10*3/uL (ref 0.0–0.7)
Eosinophils Relative: 1.3 % (ref 0.0–5.0)
HCT: 37.2 % — ABNORMAL LOW (ref 39.0–52.0)
Hemoglobin: 12.3 g/dL — ABNORMAL LOW (ref 13.0–17.0)
Lymphocytes Relative: 26.6 % (ref 12.0–46.0)
Lymphs Abs: 1.4 10*3/uL (ref 0.7–4.0)
MCHC: 33.2 g/dL (ref 30.0–36.0)
MCV: 91 fl (ref 78.0–100.0)
Monocytes Absolute: 0.6 10*3/uL (ref 0.1–1.0)
Monocytes Relative: 11.3 % (ref 3.0–12.0)
Neutro Abs: 3.2 10*3/uL (ref 1.4–7.7)
Neutrophils Relative %: 60.5 % (ref 43.0–77.0)
Platelets: 198 10*3/uL (ref 150.0–400.0)
RBC: 4.09 Mil/uL — ABNORMAL LOW (ref 4.22–5.81)
RDW: 14.8 % (ref 11.5–15.5)
WBC: 5.3 10*3/uL (ref 4.0–10.5)

## 2019-06-20 NOTE — Progress Notes (Signed)
OFFICE VISIT  06/20/2019   CC:  Chief Complaint  Patient presents with  . Hematuria    lasted all day Saturday but has subsided since then   HPI:    Patient is a 79 y.o. Caucasian male who presents accompanied by his wife for "blood in urine". Occurred 6 d/a all day (11-12 times, which is normal urinary frequency for him), every time he urinated he noted red urine, some clots. Lasted 1 day and then nothing abnormal since then.  No dysuria.  No flank pain or abd pain.  No fevers, no nausea.  No bleeding from nose or in stool.  No abnormal bruising.  He has multiple myeloma; was on velcade but this was changed to a diff inj med that he can't recall the name of--inj every 1-2 wks, also was placed on dexamethasone at that time.  Has hx of prostate ca, has had lap prostatectomy and radiation treatments in remote past.  ROS: no CP, no SOB, no wheezing, no cough, no dizziness, no HAs, no rashes, no melena/hematochezia.    No myalgias or arthralgias.   Past Medical History:  Diagnosis Date  . Alcoholism in remission ([place])    [facility] three times per week as of 01/2017.  Marland Kitchen Bone lesion   . Depression   . Fall 01/06/2018   pt fell wearing crocs while going up muddy embankment and hit head,no LOC went to ER no fractures pt sent home  . Family history of colon cancer in mother   . Hyperlipidemia    Pt declined statin 08/2017  . Hypertension   . Insomnia    uses quetiapine for this  . Multiple myeloma ([place])    [facility]  . Osteoarthritis    C-spine--hx of fusion surgery.  R foot. Knees (hx of L TKA)  . Prediabetes   . Prostate cancer [facility])    [facility]    Past Surgical History:  Procedure Laterality Date  . ANTERIOR FUSION CERVICAL SPINE  2002  . autologous stem cell transplant  2010 and 2012   [facility] in [place].  Marland Kitchen COLONOSCOPY  2015   Recall 5 yrs ([facility] in [place])  . EXCISIONAL TOTAL KNEE ARTHROPLASTY  2014  and 2016   Both knees  . LAPAROSCOPIC RETROPUBIC PROSTATECTOMY     + hx of radiation to pelvis.  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  Procedure: LEFT EXCISION MASS;  Surgeon: Leta Baptist, MD;  Location: [place];  Service: ENT;  Laterality: Left;  . SKIN FULL THICKNESS GRAFT Left 01/31/2018   Procedure: SKIN GRAFT FULL THICKNESS;  Surgeon: Leta Baptist, MD;  Location: [place];  Service: ENT;  Laterality: Left;    Outpatient Medications Prior to Visit  Medication Sig Dispense Refill  . acyclovir (ZOVIRAX) 400 MG tablet TAKE ONE TABLET BY MOUTH DAILY 30 tablet 6  . amLODipine (NORVASC) 10 MG tablet TAKE ONE TABLET BY MOUTH DAILY 90 tablet 0  . desonide (DESOWEN) 0.05 % ointment Apply 1 application topically as needed.    Marland Kitchen dexamethasone (DECADRON) 4 MG tablet Take 8 mg by mouth daily.    . Fluocinolone Acetonide Body 0.01 % OIL 120Apply daily to affected areas.  Stop when smooth. 120 mL 6  . fluorouracil (EFUDEX) 5 % cream Apply once daily to scalp for two weeks  2  . FLUoxetine (PROZAC) 40 MG capsule TAKE ONE  CAPSULE BY MOUTH DAILY 90 capsule 0  . lisinopril (ZESTRIL) 30 MG tablet Take 1 tablet (30 mg total) by mouth daily. OFFICE VISIT NEEDED 90 tablet 0  . ondansetron (ZOFRAN) 8 MG tablet TAKE 1 TABLET (8 MG TOTAL) BY MOUTH EVERY 8 (EIGHT) HOURS AS NEEDED FOR NAUSEA FOR UP TO 30 DOSES.  3  . QUEtiapine (SEROQUEL) 100 MG tablet Take 1 tablet (100 mg total) by mouth at bedtime. 90 tablet 1  . solifenacin (VESICARE) 10 MG tablet Take 1 tablet (10 mg total) by mouth daily. 30 tablet 1  . zolendronic acid (ZOMETA) 4 MG/5ML injection Inject into the vein.    Marland Kitchen zolpidem (AMBIEN) 10 MG tablet Take 10 mg by mouth at bedtime as needed.    Marland Kitchen aspirin EC 81 MG tablet Take by mouth.    . bortezomib IV (VELCADE) 3.5 MG injection Inject into the skin.    Marland Kitchen ketoconazole (NIZORAL) 2 % shampoo Apply to affected areas every other day  (Patient not taking: Reported on 06/20/2019) 120 mL 6   No facility-administered medications prior to visit.    Allergies  Allergen Reactions  . Tamiflu [Oseltamivir] Nausea And Vomiting    ROS As per HPI  PE: Blood pressure 131/76, pulse 62, temperature 98.7 F (37.1 C), temperature source Temporal, resp. rate 16, height '5\' 10"'  (1.778 m), weight 253 lb (114.8 kg), SpO2 96 %. Body mass index is 36.3 kg/m.  Gen: Alert, well appearing.  Patient is oriented to person, place, time, and situation. GEZ:MOQH: no injection, icteris, swelling, or exudate.  EOMI, PERRLA. Mouth: lips without lesion/swelling.  Oral mucosa pink and moist. Oropharynx without erythema, exudate, or swelling.  AFFECT: pleasant, lucid thought and speech. CV: RRR, no m/r/g.   LUNGS: CTA bilat, nonlabored resps, good aeration in all lung fields. ABD: soft, NT, ND, BS normal.  No hepatospenomegaly or mass.  No bruits. EXT: no clubbing or cyanosis.  no edema.  SKIN: no pallor or jaundice  LABS:    Chemistry      Component Value Date/Time   NA 136 (A) 10/03/2018 0000   K 4.2 10/03/2018 0000   CL 103 04/17/2018 1159   CO2 28 04/17/2018 1159   BUN 27 (A) 10/03/2018 0000   CREATININE 0.9 10/03/2018 0000   CREATININE 1.18 04/17/2018 1159   GLU 89 10/03/2018 0000      Component Value Date/Time   CALCIUM 9.2 04/17/2018 1159   ALKPHOS 59 10/03/2018 0000   AST 18 10/03/2018 0000   ALT 14 10/03/2018 0000   BILITOT 0.4 09/08/2017 1023     Lab Results  Component Value Date   HGBA1C 6.1 10/23/2018   01/04/2017 UA in chart shows NO BLOOD.  POC CC dipstick UA today: dark yellow, SG 1.025, otherwise all normal.  IMPRESSION AND PLAN:  Gross hematuria in the context of multiple myeloma on long term antineoplastic treatment.   No explanation detectable on history and exam today. Referral ordered for [facility] urology for further eval: likely abd/pelv CT and cystoscopy (he preferred this location due to already going to  hem/onc there regularly).  CBC and BMET today. Sent urine for c/s. D/C aspirin-he does not need this->risks outweight benefits when used for primary CV prevention, esp in this patient population.  An After Visit Summary was printed and given to the patient.  FOLLOW UP: No follow-ups on file.  Signed:  Crissie Sickles, MD           06/20/2019

## 2019-06-21 ENCOUNTER — Encounter: Payer: Self-pay | Admitting: Family Medicine

## 2019-06-21 LAB — URINE CULTURE
MICRO NUMBER:: 10067206
Result:: NO GROWTH
SPECIMEN QUALITY:: ADEQUATE

## 2019-06-24 ENCOUNTER — Telehealth: Payer: Self-pay

## 2019-06-24 NOTE — Telephone Encounter (Signed)
Patient advised nothing has been annotated just yet but will receive a call when completed.

## 2019-06-24 NOTE — Telephone Encounter (Signed)
Patient checking to see if blood/urine results are available. Please call.

## 2019-06-26 DIAGNOSIS — M8448XA Pathological fracture, other site, initial encounter for fracture: Secondary | ICD-10-CM | POA: Diagnosis not present

## 2019-06-26 DIAGNOSIS — M47812 Spondylosis without myelopathy or radiculopathy, cervical region: Secondary | ICD-10-CM | POA: Diagnosis not present

## 2019-06-26 DIAGNOSIS — C9000 Multiple myeloma not having achieved remission: Secondary | ICD-10-CM | POA: Diagnosis not present

## 2019-06-28 ENCOUNTER — Other Ambulatory Visit: Payer: Self-pay | Admitting: Family Medicine

## 2019-07-01 DIAGNOSIS — C679 Malignant neoplasm of bladder, unspecified: Secondary | ICD-10-CM

## 2019-07-01 HISTORY — DX: Malignant neoplasm of bladder, unspecified: C67.9

## 2019-07-05 DIAGNOSIS — R319 Hematuria, unspecified: Secondary | ICD-10-CM | POA: Diagnosis not present

## 2019-07-05 DIAGNOSIS — C61 Malignant neoplasm of prostate: Secondary | ICD-10-CM | POA: Diagnosis not present

## 2019-07-05 DIAGNOSIS — C9000 Multiple myeloma not having achieved remission: Secondary | ICD-10-CM | POA: Diagnosis not present

## 2019-07-05 DIAGNOSIS — Z9484 Stem cells transplant status: Secondary | ICD-10-CM | POA: Diagnosis not present

## 2019-07-05 DIAGNOSIS — R310 Gross hematuria: Secondary | ICD-10-CM | POA: Diagnosis not present

## 2019-07-05 DIAGNOSIS — R8289 Other abnormal findings on cytological and histological examination of urine: Secondary | ICD-10-CM | POA: Diagnosis not present

## 2019-07-05 DIAGNOSIS — Z9889 Other specified postprocedural states: Secondary | ICD-10-CM | POA: Diagnosis not present

## 2019-07-05 DIAGNOSIS — Z87891 Personal history of nicotine dependence: Secondary | ICD-10-CM | POA: Diagnosis not present

## 2019-07-05 DIAGNOSIS — D494 Neoplasm of unspecified behavior of bladder: Secondary | ICD-10-CM | POA: Diagnosis not present

## 2019-07-05 DIAGNOSIS — N5231 Erectile dysfunction following radical prostatectomy: Secondary | ICD-10-CM | POA: Diagnosis not present

## 2019-07-05 HISTORY — PX: CYSTOSCOPY: SUR368

## 2019-07-06 ENCOUNTER — Other Ambulatory Visit: Payer: Self-pay | Admitting: Family Medicine

## 2019-07-07 DIAGNOSIS — D494 Neoplasm of unspecified behavior of bladder: Secondary | ICD-10-CM | POA: Insufficient documentation

## 2019-07-09 DIAGNOSIS — D494 Neoplasm of unspecified behavior of bladder: Secondary | ICD-10-CM | POA: Diagnosis not present

## 2019-07-09 DIAGNOSIS — I358 Other nonrheumatic aortic valve disorders: Secondary | ICD-10-CM | POA: Diagnosis not present

## 2019-07-09 DIAGNOSIS — Z20822 Contact with and (suspected) exposure to covid-19: Secondary | ICD-10-CM | POA: Diagnosis not present

## 2019-07-10 DIAGNOSIS — I10 Essential (primary) hypertension: Secondary | ICD-10-CM | POA: Diagnosis not present

## 2019-07-10 DIAGNOSIS — C9000 Multiple myeloma not having achieved remission: Secondary | ICD-10-CM | POA: Diagnosis not present

## 2019-07-10 DIAGNOSIS — Z8546 Personal history of malignant neoplasm of prostate: Secondary | ICD-10-CM | POA: Diagnosis not present

## 2019-07-10 DIAGNOSIS — C670 Malignant neoplasm of trigone of bladder: Secondary | ICD-10-CM | POA: Diagnosis not present

## 2019-07-10 DIAGNOSIS — C679 Malignant neoplasm of bladder, unspecified: Secondary | ICD-10-CM | POA: Diagnosis not present

## 2019-07-10 DIAGNOSIS — Z9079 Acquired absence of other genital organ(s): Secondary | ICD-10-CM | POA: Diagnosis not present

## 2019-07-10 DIAGNOSIS — C678 Malignant neoplasm of overlapping sites of bladder: Secondary | ICD-10-CM | POA: Diagnosis not present

## 2019-07-10 DIAGNOSIS — Z923 Personal history of irradiation: Secondary | ICD-10-CM | POA: Diagnosis not present

## 2019-07-10 HISTORY — PX: TRANSURETHRAL RESECTION OF BLADDER TUMOR WITH GYRUS (TURBT-GYRUS): SHX6458

## 2019-07-18 DIAGNOSIS — C674 Malignant neoplasm of posterior wall of bladder: Secondary | ICD-10-CM | POA: Insufficient documentation

## 2019-07-24 ENCOUNTER — Encounter: Payer: Self-pay | Admitting: Family Medicine

## 2019-07-31 ENCOUNTER — Other Ambulatory Visit: Payer: Self-pay | Admitting: Family Medicine

## 2019-07-31 DIAGNOSIS — Z23 Encounter for immunization: Secondary | ICD-10-CM | POA: Diagnosis not present

## 2019-07-31 NOTE — Telephone Encounter (Signed)
RF request for Seroquel LOV:06/11/19, acute Next ov: n/a Last written: 07/27/18(90,1)   Please advise, thanks. Medication pending

## 2019-08-01 ENCOUNTER — Telehealth: Payer: Self-pay

## 2019-08-01 NOTE — Telephone Encounter (Signed)
2nd dose 3 wks after 1st.

## 2019-08-01 NOTE — Telephone Encounter (Signed)
I believe the time frame is 4-6 weeks after 1st vaccine? Please advise, thanks.

## 2019-08-01 NOTE — Telephone Encounter (Signed)
Patient had his first [place] vaccine yesterday through [facility]. Patient is asking what is the window of time that he has to get the 2nd vaccine. He is having difficulty scheduling it with [facility]

## 2019-08-01 NOTE — Telephone Encounter (Signed)
Patient advised.

## 2019-08-02 ENCOUNTER — Other Ambulatory Visit: Payer: Self-pay | Admitting: Family Medicine

## 2019-08-08 DIAGNOSIS — R768 Other specified abnormal immunological findings in serum: Secondary | ICD-10-CM | POA: Diagnosis not present

## 2019-08-08 DIAGNOSIS — C9000 Multiple myeloma not having achieved remission: Secondary | ICD-10-CM | POA: Diagnosis not present

## 2019-08-09 ENCOUNTER — Other Ambulatory Visit: Payer: Self-pay

## 2019-08-09 ENCOUNTER — Encounter: Payer: Self-pay | Admitting: Family Medicine

## 2019-08-09 ENCOUNTER — Ambulatory Visit (INDEPENDENT_AMBULATORY_CARE_PROVIDER_SITE_OTHER): Payer: Medicare Other | Admitting: Family Medicine

## 2019-08-09 VITALS — BP 130/76 | HR 65 | Temp 98.4°F | Resp 16 | Ht 70.0 in | Wt 256.2 lb

## 2019-08-09 DIAGNOSIS — F1021 Alcohol dependence, in remission: Secondary | ICD-10-CM

## 2019-08-09 DIAGNOSIS — R4587 Impulsiveness: Secondary | ICD-10-CM

## 2019-08-09 DIAGNOSIS — Z630 Problems in relationship with spouse or partner: Secondary | ICD-10-CM

## 2019-08-09 DIAGNOSIS — R454 Irritability and anger: Secondary | ICD-10-CM | POA: Diagnosis not present

## 2019-08-09 DIAGNOSIS — Z8659 Personal history of other mental and behavioral disorders: Secondary | ICD-10-CM

## 2019-08-09 MED ORDER — LAMOTRIGINE 25 MG PO TABS: ORAL_TABLET | ORAL | 0 refills | Status: DC
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
## 2019-08-15 DIAGNOSIS — C9000 Multiple myeloma not having achieved remission: Secondary | ICD-10-CM | POA: Diagnosis not present

## 2019-08-21 ENCOUNTER — Ambulatory Visit: Payer: Medicare Other | Admitting: Family Medicine

## 2019-08-22 ENCOUNTER — Ambulatory Visit (INDEPENDENT_AMBULATORY_CARE_PROVIDER_SITE_OTHER): Payer: Medicare Other | Admitting: Family Medicine

## 2019-08-22 ENCOUNTER — Ambulatory Visit: Payer: Medicare Other | Admitting: Family Medicine

## 2019-08-22 ENCOUNTER — Other Ambulatory Visit: Payer: Self-pay

## 2019-08-22 ENCOUNTER — Encounter: Payer: Self-pay | Admitting: Family Medicine

## 2019-08-22 VITALS — BP 130/70 | HR 73 | Temp 98.8°F | Resp 16 | Ht 70.0 in | Wt 256.2 lb

## 2019-08-22 DIAGNOSIS — F489 Nonpsychotic mental disorder, unspecified: Secondary | ICD-10-CM | POA: Diagnosis not present

## 2019-08-22 DIAGNOSIS — R4587 Impulsiveness: Secondary | ICD-10-CM | POA: Diagnosis not present

## 2019-08-22 DIAGNOSIS — R454 Irritability and anger: Secondary | ICD-10-CM | POA: Diagnosis not present

## 2019-08-22 MED ORDER — LAMOTRIGINE 100 MG PO TABS: 100.0000 mg | ORAL_TABLET | Freq: Every day | ORAL | 0 refills | Status: DC
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
## 2019-08-22 NOTE — Addendum Note (Signed)
Addended by: Tammi Sou on: 08/22/2019 12:08 PM   Modules accepted: Level of Service

## 2019-09-05 DIAGNOSIS — C9000 Multiple myeloma not having achieved remission: Secondary | ICD-10-CM | POA: Diagnosis not present

## 2019-09-11 ENCOUNTER — Other Ambulatory Visit: Payer: Self-pay

## 2019-09-12 ENCOUNTER — Other Ambulatory Visit: Payer: Self-pay

## 2019-09-12 ENCOUNTER — Encounter: Payer: Self-pay | Admitting: Family Medicine

## 2019-09-12 ENCOUNTER — Ambulatory Visit (INDEPENDENT_AMBULATORY_CARE_PROVIDER_SITE_OTHER): Payer: Medicare Other | Admitting: Family Medicine

## 2019-09-12 VITALS — BP 131/75 | HR 63 | Temp 98.3°F | Resp 16 | Ht 70.0 in | Wt 254.8 lb

## 2019-09-12 DIAGNOSIS — I10 Essential (primary) hypertension: Secondary | ICD-10-CM | POA: Diagnosis not present

## 2019-09-12 DIAGNOSIS — R7303 Prediabetes: Secondary | ICD-10-CM

## 2019-09-12 LAB — BASIC METABOLIC PANEL
BUN: 20 mg/dL (ref 6–23)
CO2: 29 mEq/L (ref 19–32)
Calcium: 9.5 mg/dL (ref 8.4–10.5)
Chloride: 99 mEq/L (ref 96–112)
Creatinine, Ser: 0.95 mg/dL (ref 0.40–1.50)
GFR: 76.8 mL/min (ref >60.00)
Glucose, Bld: 95 mg/dL (ref 70–99)
Potassium: 4.6 mEq/L (ref 3.5–5.1)
Sodium: 135 mEq/L (ref 135–145)

## 2019-09-12 LAB — HEMOGLOBIN A1C: Hgb A1c MFr Bld: 5.9 % (ref 4.6–6.5)

## 2019-09-12 NOTE — Patient Instructions (Signed)
If you choose to stop taking lamotrigine then take 1/2 of your 100mg  tab once EVERY OTHER DAY for 7 doses, then stop this medication completely.  If you choose to STAY on the lamotrigine then take 1 of the 100mg  tabs once daily and make follow up appt with me in 1 month.

## 2019-09-12 NOTE — Progress Notes (Signed)
OFFICE VISIT  09/12/2019   CC:  Chief Complaint  Patient presents with  . Follow-up    mood/anger   HPI:    Patient is a 79 y.o. Caucasian male who presents for 3 wk f/u for irritability and difficulty controlling anger.  Also f/u HTN and prediabetes. A/P as of last visit: "Multiple problematic behavioral difficulties: excessive anger and problems controlling it, irritability, seemingly indifferent/poor insight to the severity of his problem.  His wife is simply distraught at the situation. No change in sx's with starting low dose lamictal. Starts 34m qd lamictal today x 2 wks. I sent in rx for 100 mg lamictal to take 1 qd when he is finished with his 576mqd dosing---instructions on rx to "fill upon patient request".   Continue fluoxetine, seroquel, ambien at current doses."  Interim hx: Seems to be improved.  Wife agrees. Starts lamictal 100 mg qd dosing today. However, he states he really doesn't think he needs to be on med.  Says he will continue taking med b/c he thinks it may help but he is mainly taking it to appease his wife.  He asks if she can be put on meds to calm her down and make her less critical of him. She states she may go get therapy b/c she has been tra[facility]ized [facility]. She is seeking care with a PhD friend of her pastor. Pt states he feels overmedicated, makes him want to nap all the time. Wife is worried b/c he may have to get back on dexamethasone with each chemo treatment-->[facility] (irrit and anger control issues but never to the point of violence/physical abuse) from steroids. He has restaging in May and they'll see if he'll restart chemo/dexa at that time.  HTN: home bp monitoring->no home bp monitoring being done. Prediab: not focusing on any TLC at this time.  ROS: no fevers, no CP, no SOB, no wheezing, no cough, no dizziness, no HAs, no rashes, no melena/hematochezia.  No polyuria or polydipsia.  No  myalgias or arthralgias.  No focal weakness, paresthesias, or tremors.  No acute vision or hearing abnormalities. No n/v/d or abd pain.  No palpitations.     Past Medical History:  Diagnosis Date  . [place] in [place] ([facility]   [facility] three times per week as of 01/2017.  . [place] ([place]   07/10/2019 TURBT/gemcitabine instillation - LG, Ta, no muscle present  . Bone lesion   . Depression   . Fall 01/06/2018   pt fell wearing crocs while going up muddy embankment and hit head,no LOC went to ER no fractures pt sent home  . Family history of colon cancer in mother   . 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 Hyperlipidemia    Pt declined statin 08/2017  . Hypertension   . Insomnia    uses quetiapine for this  . [place]oma ([facility]   [facility]  . Osteoarthritis    C-spine--hx of fusion surgery.  R foot. Knees (hx of L TKA)  . Prediabetes   . [facility]    Past Surgical History:  Procedure Laterality Date  . ANTERIOR FUSION CERVICAL SPINE  2002  . [facility]  [facility] [place] [facility] Kitchen[facility]COPY  2015   Recall 5 yrs (  [facility] in [place])  . CYSTOSCOPY  07/05/2019   for gross hematuria; noninvasive-appearing tumor found on cystoscopy->plan for TURBT.  Marland Kitchen EXCISIONAL TOTAL KNEE ARTHROPLASTY  2014 and 2016   Both knees  . LAPAROSCOPIC RETROPUBIC PROSTATECTOMY     + hx of radiation to pelvis.  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  Procedure: LEFT EXCISION MASS;  Surgeon: Leta Baptist, MD;  Location: [place];  Service: ENT;  Laterality: Left;  . SKIN FULL THICKNESS GRAFT Left 01/31/2018   Procedure: SKIN GRAFT FULL THICKNESS;  Surgeon: Leta Baptist, MD;  Location: [place];  Service: ENT;  Laterality: Left;  .  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 acyclovir (ZOVIRAX) 400 MG tablet TAKE ONE TABLET BY MOUTH DAILY 30 tablet 6  . amLODipine (NORVASC) 10 MG tablet TAKE ONE TABLET BY MOUTH EVERY DAY 90 tablet 0  . desonide (DESOWEN) 0.05 % ointment Apply 1 application topically as needed.    Marland Kitchen FLUoxetine (PROZAC) 40 MG capsule TAKE ONE CAPSULE BY MOUTH DAILY 90 capsule 0  . ibuprofen (ADVIL) 800 MG tablet Take 800 mg by mouth every 8 (eight) hours as needed.    . lamoTRIgine (LAMICTAL) 100 MG tablet Take 1 tablet (100 mg total) by mouth daily. 30 tablet 0  . lisinopril (ZESTRIL) 30 MG tablet Take 1 tablet (30 mg total) by mouth daily. OFFICE VISIT NEEDED 90 tablet 0  . ondansetron (ZOFRAN) 8 MG tablet TAKE 1 TABLET (8 MG TOTAL) BY MOUTH EVERY 8 (EIGHT) HOURS AS NEEDED FOR NAUSEA FOR UP TO 30 DOSES.  3  . oxybutynin (DITROPAN) 5 MG tablet Take 5 mg by mouth 3 (three) times daily as needed.    Marland Kitchen QUEtiapine (SEROQUEL) 100 MG tablet TAKE ONE TABLET BY MOUTH AT BEDTIME 90 tablet 3  . zolendronic acid (ZOMETA) 4 MG/5ML injection Inject into the vein.    Marland Kitchen zolpidem (AMBIEN) 10 MG tablet Take 10 mg by mouth at bedtime as needed.    . lamoTRIgine (LAMICTAL) 25 MG tablet 1 tab po once a day x 15d, then increase to 2 tabs po once a day 45 tablet 0   No facility-administered medications prior to visit.    Allergies  Allergen Reactions  . Tamiflu [Oseltamivir] Nausea And Vomiting    ROS As per HPI  PE: Blood pressure 131/75, pulse 63, temperature 98.3 F (36.8 C), temperature source Temporal, resp. rate 16, height '5\' 10"'  (1.778 m), weight 254 lb 12.8 oz (115.6 kg), SpO2 98 %. Body mass index is 36.56 kg/m.  Gen: Alert, well appearing.  Patient is oriented to person, place, time, and situation. AFFECT: pleasant, lucid  thought and speech. No further exam today.  LABS:   Lab Results  Component Value Date   HGBA1C 6.1 10/23/2018   Lab Results  Component Value Date   CHOL 255 (H) 09/08/2017   HDL 33.40 (L) 09/08/2017   LDLCALC 184 (H) 09/08/2017   TRIG 189.0 (H) 09/08/2017   CHOLHDL 8 09/08/2017      Chemistry      Component Value Date/Time   NA 137 06/20/2019 1153   NA 135 (A) 06/12/2019 0000   K 4.4 06/20/2019 1153   CL 102 06/20/2019 1153   CO2 29 06/20/2019 1153  BUN 17 06/20/2019 1153   BUN 27 (A) 06/12/2019 0000   CREATININE 0.89 06/20/2019 1153   GLU 93 06/12/2019 0000      Component Value Date/Time   CALCIUM 9.2 06/20/2019 1153   ALKPHOS 61 06/12/2019 0000   AST 20 06/12/2019 0000   ALT 19 06/12/2019 0000   BILITOT 0.4 09/08/2017 1023      IMPRESSION AND PLAN:  1) Anger control problem, excessive irritability. Hx of anxiety and likely MDD, ? Bipolar II? Ultimately, at this time the biggest issue is that pt and his wife have a horrible marriage (they agree) and I told him I don't want him taking lamotrigine if he's taking it only to appease his wife.  He seems to think it may be helping some but is leaning towards stopping the med. I told him that if he continues to take it, his dose is 184m qd now, f/u in 1 mo. If he decides to NOT continue lamictal then I recommended he take 1/2 of 1019mtab qod x 7 doses then stop. No other med changes recommended today.  2) HTN: The current medical regimen is effective;  continue present plan and medications. Lytes/cr today.  3) Prediabetes: monitor glucose and Hba1c today.  An After Visit Summary was printed and given to the patient.  FOLLOW UP: Return in about 3 months (around 12/12/2019) for routine chronic illness f/u.  Signed:  PhCrissie SicklesMD           09/12/2019

## 2019-09-19 DIAGNOSIS — C9000 Multiple myeloma not having achieved remission: Secondary | ICD-10-CM | POA: Diagnosis not present

## 2019-09-19 DIAGNOSIS — G8929 Other chronic pain: Secondary | ICD-10-CM | POA: Diagnosis not present

## 2019-09-19 DIAGNOSIS — M549 Dorsalgia, unspecified: Secondary | ICD-10-CM | POA: Diagnosis not present

## 2019-09-30 ENCOUNTER — Other Ambulatory Visit: Payer: Self-pay | Admitting: Family Medicine

## 2019-10-04 ENCOUNTER — Telehealth: Payer: Self-pay

## 2019-10-04 DIAGNOSIS — C674 Malignant neoplasm of posterior wall of bladder: Secondary | ICD-10-CM | POA: Diagnosis not present

## 2019-10-04 DIAGNOSIS — C9000 Multiple myeloma not having achieved remission: Secondary | ICD-10-CM | POA: Diagnosis not present

## 2019-10-04 DIAGNOSIS — C679 Malignant neoplasm of bladder, unspecified: Secondary | ICD-10-CM | POA: Diagnosis not present

## 2019-10-04 DIAGNOSIS — D494 Neoplasm of unspecified behavior of bladder: Secondary | ICD-10-CM | POA: Diagnosis not present

## 2019-10-04 DIAGNOSIS — Z8579 Personal history of other malignant neoplasms of lymphoid, hematopoietic and related tissues: Secondary | ICD-10-CM | POA: Diagnosis not present

## 2019-10-04 NOTE — Telephone Encounter (Signed)
Pls notify pt's wife that I need to see Jarry first b/c I want to make sure it is HIM that wants the med and psych referral (virtual or in-person, either one is fine).  I need this visit to be with Dominica Severin only---wife NOT included on this visit.-thx

## 2019-10-04 NOTE — Telephone Encounter (Signed)
Please advise 

## 2019-10-04 NOTE — Telephone Encounter (Signed)
Wife Lucita Ferrara [facility]) needs referral to psychiatrist for patient. And also is requesting meds if Dr. Genelle Gather thinks he can prescribe anything for anxiety until he can get an appt.   Please call [phone]

## 2019-10-05 ENCOUNTER — Other Ambulatory Visit: Payer: Self-pay | Admitting: Family Medicine

## 2019-10-07 NOTE — Telephone Encounter (Signed)
LM for pt to returncall

## 2019-10-07 NOTE — Telephone Encounter (Signed)
Patient returned call and verified he wanted referral. O/v appt made, pt is aware he will be the only one allowed for appt.

## 2019-10-10 ENCOUNTER — Ambulatory Visit (INDEPENDENT_AMBULATORY_CARE_PROVIDER_SITE_OTHER): Payer: Medicare Other | Admitting: Family Medicine

## 2019-10-10 ENCOUNTER — Other Ambulatory Visit: Payer: Self-pay

## 2019-10-10 ENCOUNTER — Encounter: Payer: Self-pay | Admitting: Family Medicine

## 2019-10-10 VITALS — BP 124/69 | HR 69 | Temp 99.0°F | Resp 16 | Ht 70.0 in | Wt 257.6 lb

## 2019-10-10 DIAGNOSIS — R454 Irritability and anger: Secondary | ICD-10-CM

## 2019-10-10 NOTE — Progress Notes (Signed)
OFFICE NOTE  11/28/2019  CC:  Chief Complaint  Patient presents with  . Discuss psych referral   HPI:   Patient is a 79 y.o. Caucasian male who is here for discussion of psychiatric issues and essentially marital problems associated/due to these. Wife recently called to request psychiatrist referral for him.  However, he has not been one to initiate this in the past at all so I wanted to have him come in for discussion of this w/out his wife to make sure it is HIM that wants to proceed with psychiatrist eval/mgmt. 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   Has no libido, says he and his wife have not had sex in 4 yrs. Says he is happy, not depressed mood, thinks his wife is overly critical of his every thought and behavior.  Worse the last 5 yrs since she has become more religious. Wife says he is "jekyl and hyde", particularly when he has to be on steroid as part of his mult myeloma tx regimen. Wife accuses him of giving gifts to other women, infidelities, etc.  He says he does not think he has any psychiatric problem in need of medical treatment or counseling.  He admits all of his "psych" issues are simply a manifestation of a bad marriage.  He says he thinks his wife wants him more sedated.  He does NOT desire a psychiatrist referral.  He has not been taking the lamictal I have rx'd him in the past. He seems to be continuing to take the fluoxetine. Staying on ambien and low dose seroquel for bad insomnia.  Pertinent PMH:  Anx and dep  MEDS;   Outpatient Medications Prior to Visit  Medication Sig Dispense Refill  . acyclovir (ZOVIRAX) 400 MG tablet TAKE ONE TABLET BY MOUTH DAILY 30 tablet 6  . amLODipine  (NORVASC) 10 MG tablet TAKE ONE TABLET BY MOUTH EVERY DAY 90 tablet 0  . desonide (DESOWEN) 0.05 % ointment Apply 1 application topically as needed.    Marland Kitchen FLUoxetine (PROZAC) 40 MG capsule TAKE ONE CAPSULE BY MOUTH EVERY DAY 90 capsule 0  . ibuprofen (ADVIL) 800 MG tablet Take 800 mg by mouth every 8 (eight) hours as needed.    . lamoTRIgine (LAMICTAL) 100 MG tablet Take 1 tablet (100 mg total) by mouth daily. 30 tablet 0  . ondansetron (ZOFRAN) 8 MG tablet TAKE 1 TABLET (8 MG TOTAL) BY MOUTH EVERY 8 (EIGHT) HOURS AS NEEDED FOR NAUSEA FOR UP TO 30 DOSES.  3  . oxybutynin (DITROPAN) 5 MG tablet Take 5 mg by mouth 3 (three) times daily as needed.    Marland Kitchen QUEtiapine (SEROQUEL) 100 MG tablet TAKE ONE TABLET BY MOUTH AT BEDTIME 90 tablet 3  . zolendronic acid (ZOMETA) 4 MG/5ML injection Inject into the vein.    Marland Kitchen zolpidem (AMBIEN) 10 MG tablet Take 10 mg by mouth at bedtime as needed.    Marland Kitchen lisinopril (ZESTRIL) 30 MG tablet Take 1 tablet (30 mg total) by mouth daily. OFFICE VISIT NEEDED 90 tablet 0   No facility-administered medications prior to visit.    PE: Blood pressure 124/69, pulse 69, temperature 99 F (37.2 C), temperature source Temporal,  resp. rate 16, height 5\' 10"  (1.778 m), weight 257 lb 9.6 oz (116.8 kg), SpO2 95 %. Gen: Alert, well appearing.  Patient is oriented to person, place, time, and situation. AFFECT: pleasant, lucid thought and speech. No further exam today.  IMPRESSION AND PLAN:  1) Irritability: he has a horrible marriage and he blames himself some but his spouse more.  His wife feels he is the problem and not her. I don't think he is clinically depressed at this time although in the relatively remote past I think he was so I'll keep him on fluoxetine maintenance at this time.  No referral to psychiatrist is desired by pt, nor do I think he needs one.   I reiterated that I think all of this is a marital issue and I am unable to help. He and wife decline marital counseling  b/c it has never worked for them in the past. I encouraged him to have his wife come in for a separate visit w/out him so she could give her side of the story so I could see if any change needs to be made in my assessment of this whole situation.  He said he would be glad to do this.  Spent 35 min with pt today, with >50% of this time spent in counseling and care coordination regarding the above problems.  An After Visit Summary was printed and given to the patient.  FOLLOW UP:  No follow-ups on file.  Signed:  Crissie Sickles, MD           11/28/2019

## 2019-10-14 ENCOUNTER — Other Ambulatory Visit: Payer: Self-pay | Admitting: Family Medicine

## 2019-10-18 DIAGNOSIS — G8929 Other chronic pain: Secondary | ICD-10-CM | POA: Diagnosis not present

## 2019-10-18 DIAGNOSIS — C9000 Multiple myeloma not having achieved remission: Secondary | ICD-10-CM | POA: Diagnosis not present

## 2019-10-18 DIAGNOSIS — M549 Dorsalgia, unspecified: Secondary | ICD-10-CM | POA: Diagnosis not present

## 2019-10-31 ENCOUNTER — Other Ambulatory Visit: Payer: Self-pay | Admitting: Family Medicine

## 2019-11-08 DIAGNOSIS — Z01818 Encounter for other preprocedural examination: Secondary | ICD-10-CM | POA: Diagnosis not present

## 2019-11-08 DIAGNOSIS — C9000 Multiple myeloma not having achieved remission: Secondary | ICD-10-CM | POA: Diagnosis not present

## 2019-11-11 ENCOUNTER — Other Ambulatory Visit: Payer: Self-pay | Admitting: Family Medicine

## 2019-11-22 IMAGING — CT CT CERVICAL SPINE W/O CM
4 of 7 series · 13 of 33 positions shown, 14 images · non-contrast
Comparison: CT of the head and cervical spine performed 01/04/2017

CLINICAL DATA: Status post fall backwards, with occipital abrasion.
Concern for head or cervical spine injury. Personal history of
multiple myeloma.

EXAM:
CT HEAD WITHOUT CONTRAST
CT CERVICAL SPINE WITHOUT CONTRAST
TECHNIQUE: Multidetector CT imaging of the head and cervical spine was
performed following the standard protocol without intravenous
contrast. Multiplanar CT image reconstructions of the cervical spine
were also generated.

[Series 7: c_spine 2.0 i30s 3 · axial · 0.38mm/px · z∈[-256,-176]mm · 3 of 81 slices shown]
[im 21/81  bone]
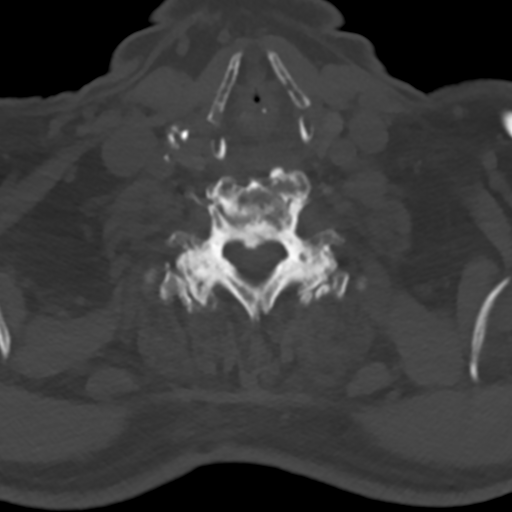
[im 41/81  bone]
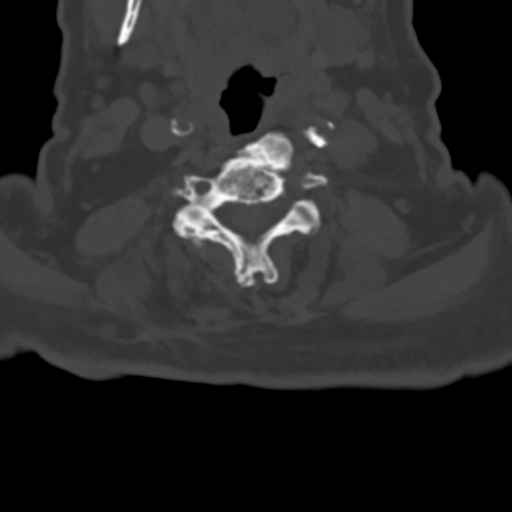
[im 61/81  bone]
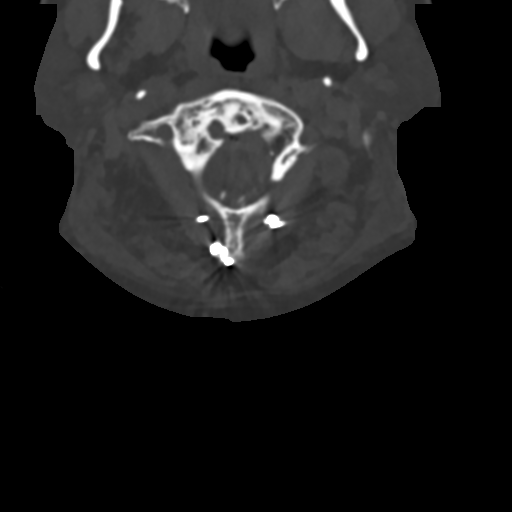

[Series 9: coronals · coronal · 0.25mm/px · 1 of 67 slices shown]
[im 34/67  bone]
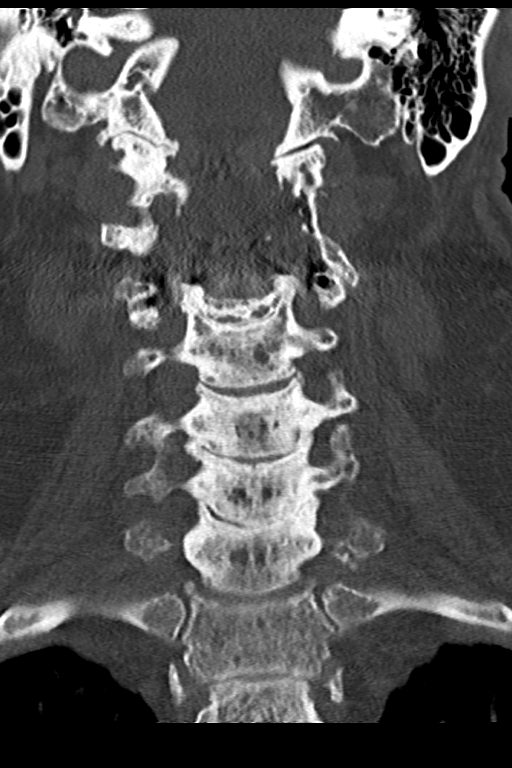

[Series 10: sagittals · sagittal · 0.25mm/px · 5 of 59 slices shown]
[im 10/59  bone]
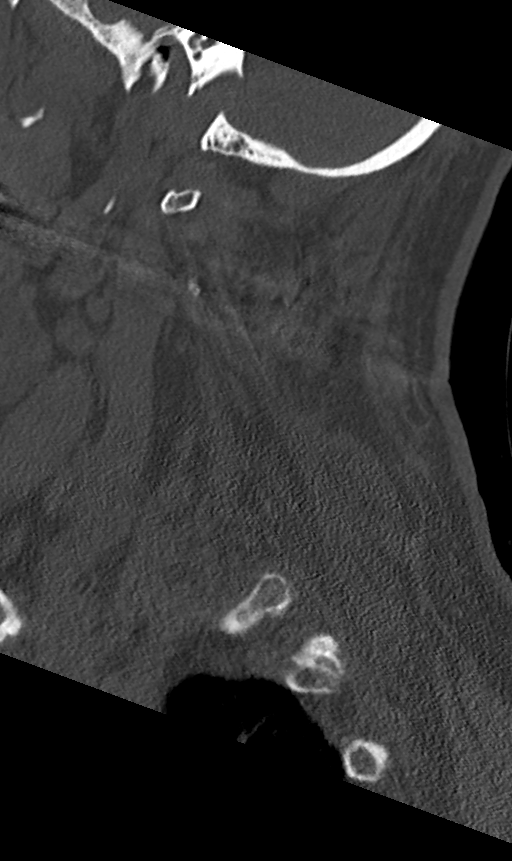
[im 20/59  bone]
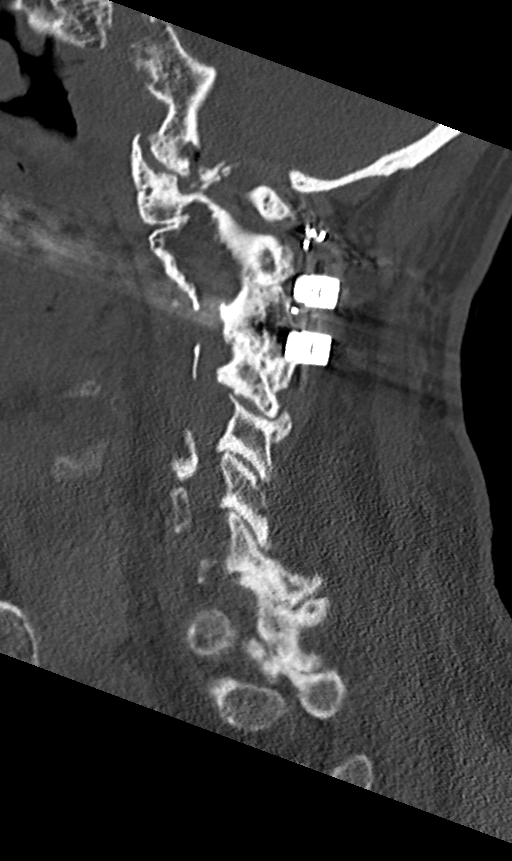
[im 30/59  bone]
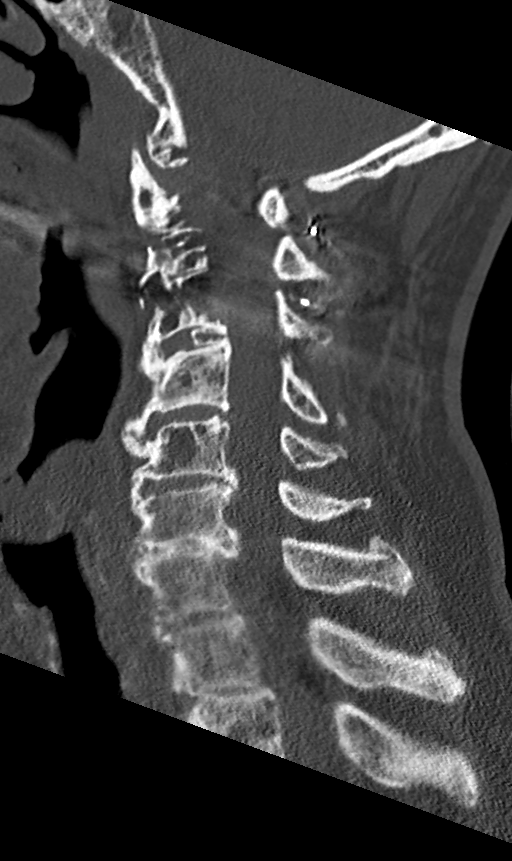
[im 39/59  bone]
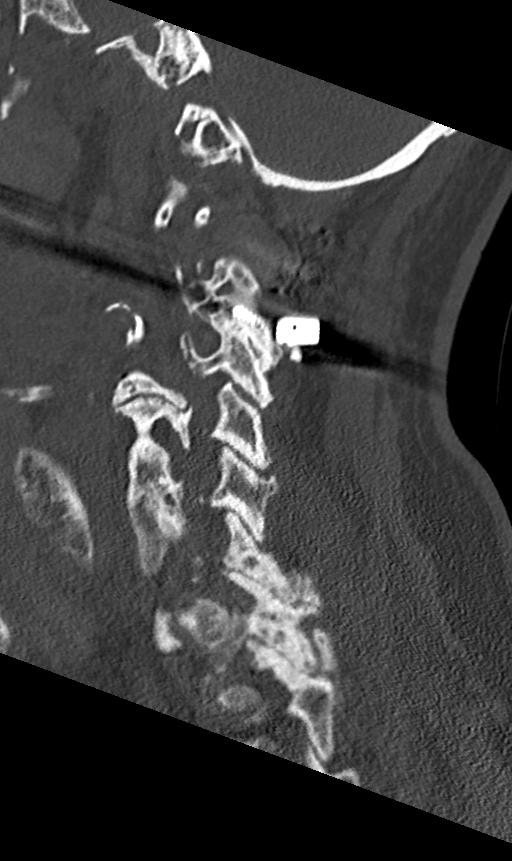
[im 49/59  bone]
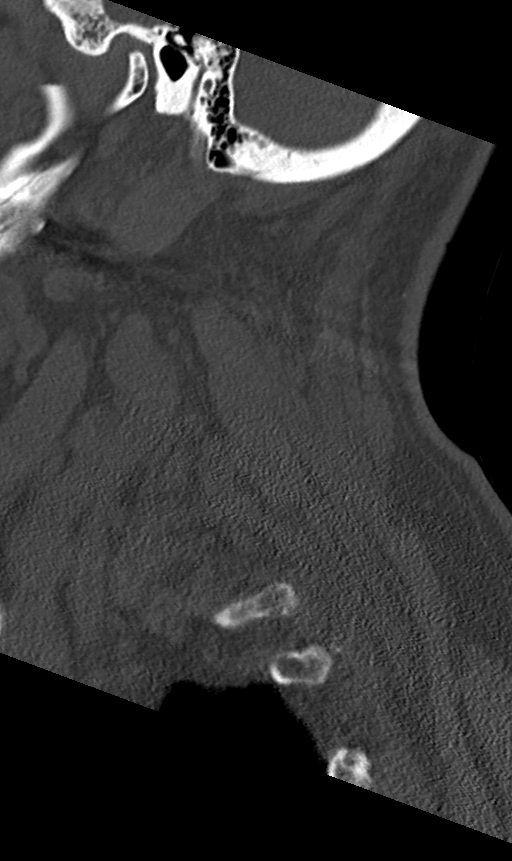

[Series 11: orthogonals · axial · 0.23mm/px · z∈[-295,-186]mm · 4 of 99 slices shown, 5 images]
[im 20/99  soft-tissue]
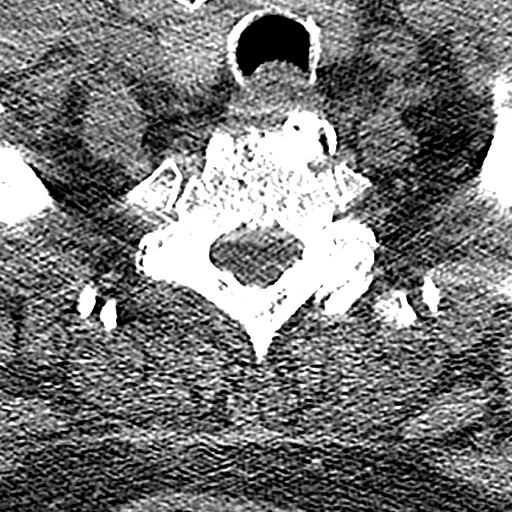
[im 20/99  bone]
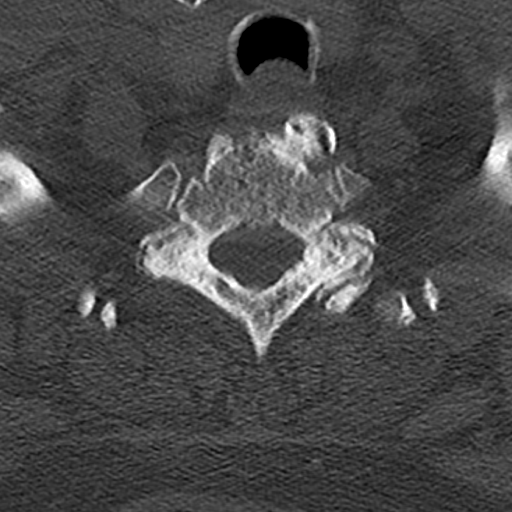
[im 40/99  bone]
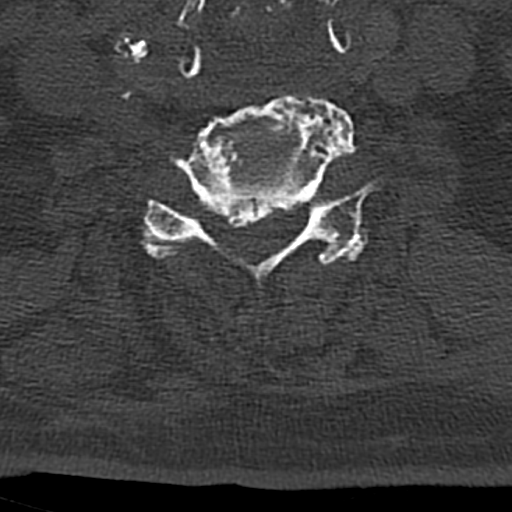
[im 59/99  bone]
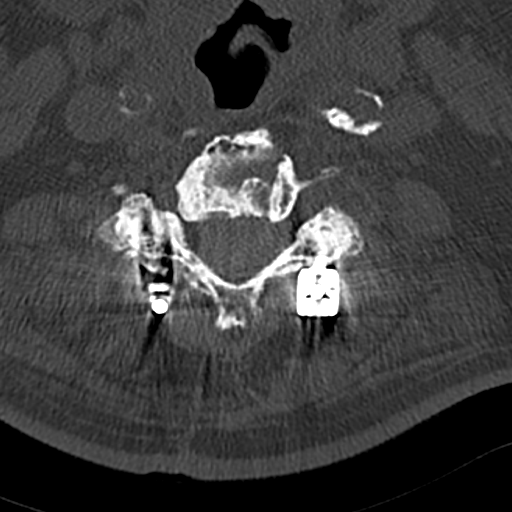
[im 79/99  bone]
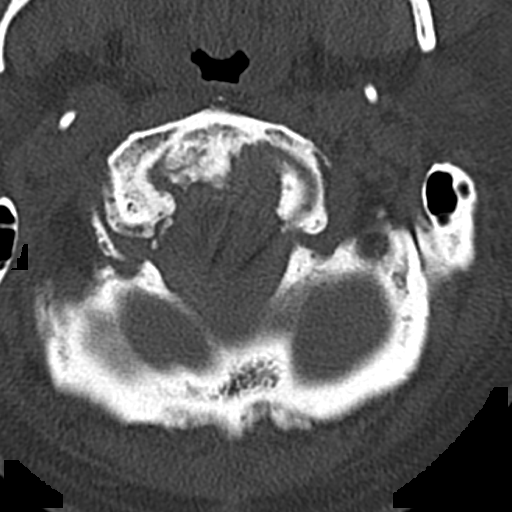

[13 of 33 positions shown; findings below may reference images not displayed]

FINDINGS: CT HEAD FINDINGS

Brain: No evidence of acute infarction, hemorrhage, hydrocephalus,
extra-axial collection or mass lesion / mass effect.

Prominence of the ventricles and sulci reflects mild cortical volume
loss. Mild periventricular white matter change likely reflects small
vessel ischemic microangiopathy.

The brainstem and fourth ventricle are within normal limits. The
basal ganglia are unremarkable in appearance. The cerebral
hemispheres demonstrate grossly normal gray-white differentiation.
No mass effect or midline shift is seen.

Vascular: No hyperdense vessel or unexpected calcification.

Skull: There is no evidence of fracture; heterotopic bone formation
is noted at the right temporomandibular joint, likely reflecting
remote traumatic injury.

Sinuses/Orbits: The orbits are within normal limits. Mucosal
thickening is noted at the maxillary sinuses bilaterally. The
remaining paranasal sinuses and mastoid air cells are well-aerated.

Other: No significant soft tissue abnormalities are seen.

CT CERVICAL SPINE FINDINGS

Alignment: Grossly normal.

Skull base and vertebrae: There is chronic diffuse resorption of C2
and C3, with surrounding degenerative change and underlying
posterior fusion hardware. No definite acute fracture is seen.

Soft tissues and spinal canal: No prevertebral fluid or swelling. No
visible canal hematoma.

Disc levels: Mild intervertebral disc space narrowing is noted along
the lower cervical and upper thoracic spine. Scattered anterior and
posterior disc osteophyte complexes are seen along the cervical
spine.

Upper chest: The visualized lung apices are clear. A small
hypodensity at the right thyroid lobe is likely benign, given its
size. A calcification is also noted at the right thyroid lobe. Dense
calcification is noted at the proximal internal carotid arteries
bilaterally.

Other: No additional soft tissue abnormalities are seen.
IMPRESSION: 1. No evidence of traumatic intracranial injury or fracture.
2. No evidence of fracture or subluxation along the cervical spine.
3. Mild cortical volume loss and scattered small vessel ischemic
microangiopathy.
4. Chronic diffuse resorption of C2 and C3, with surrounding
degenerative change and underlying posterior fusion hardware.
Degenerative change along the cervical spine.
5. Heterotopic bone formation at the right temporomandibular joint
likely reflects remote traumatic injury.
6. Mucosal thickening at the maxillary sinuses bilaterally.
7. Dense calcification at the proximal internal carotid arteries
bilaterally. Carotid ultrasound would be helpful for further
evaluation, when and as deemed clinically appropriate.

## 2019-11-22 IMAGING — DX DG THORACIC SPINE 3V
3 series · 3 of 3 positions shown · non-contrast
Comparison: CT 06/26/2017

CLINICAL DATA: Multiple myeloma with radiation to the thoracic
spine.

EXAM:
THORACIC SPINE 2 VIEWS

[t-spine ap]
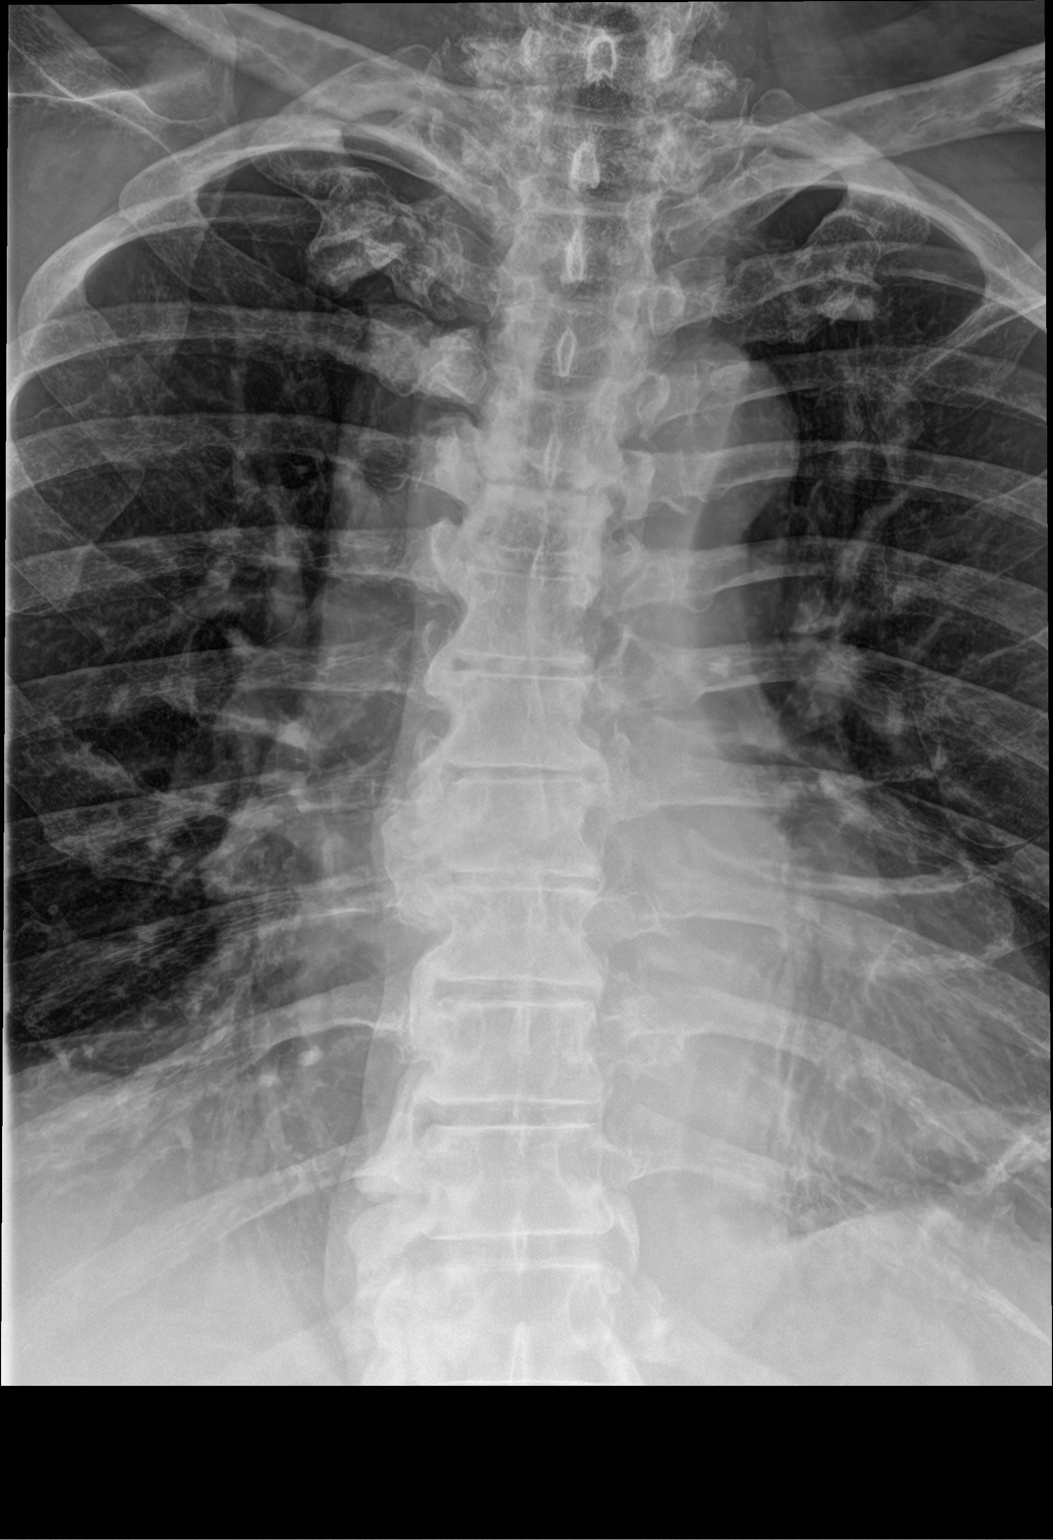

[t-spine lat]
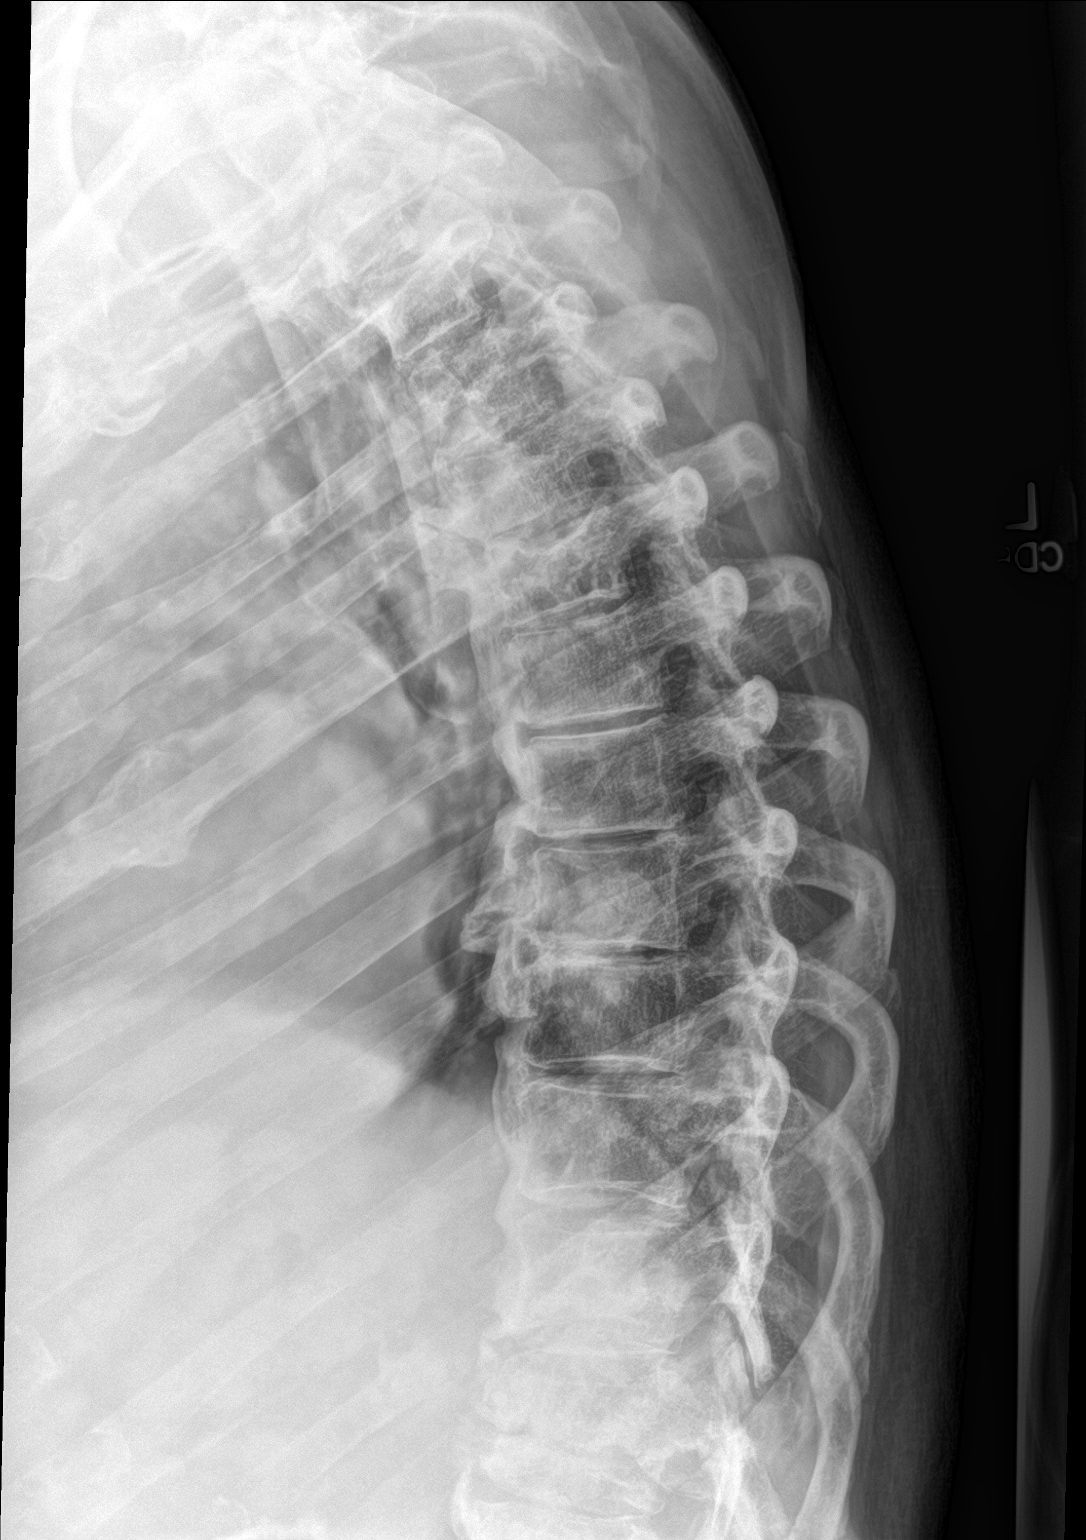

[t-spine swimmers]
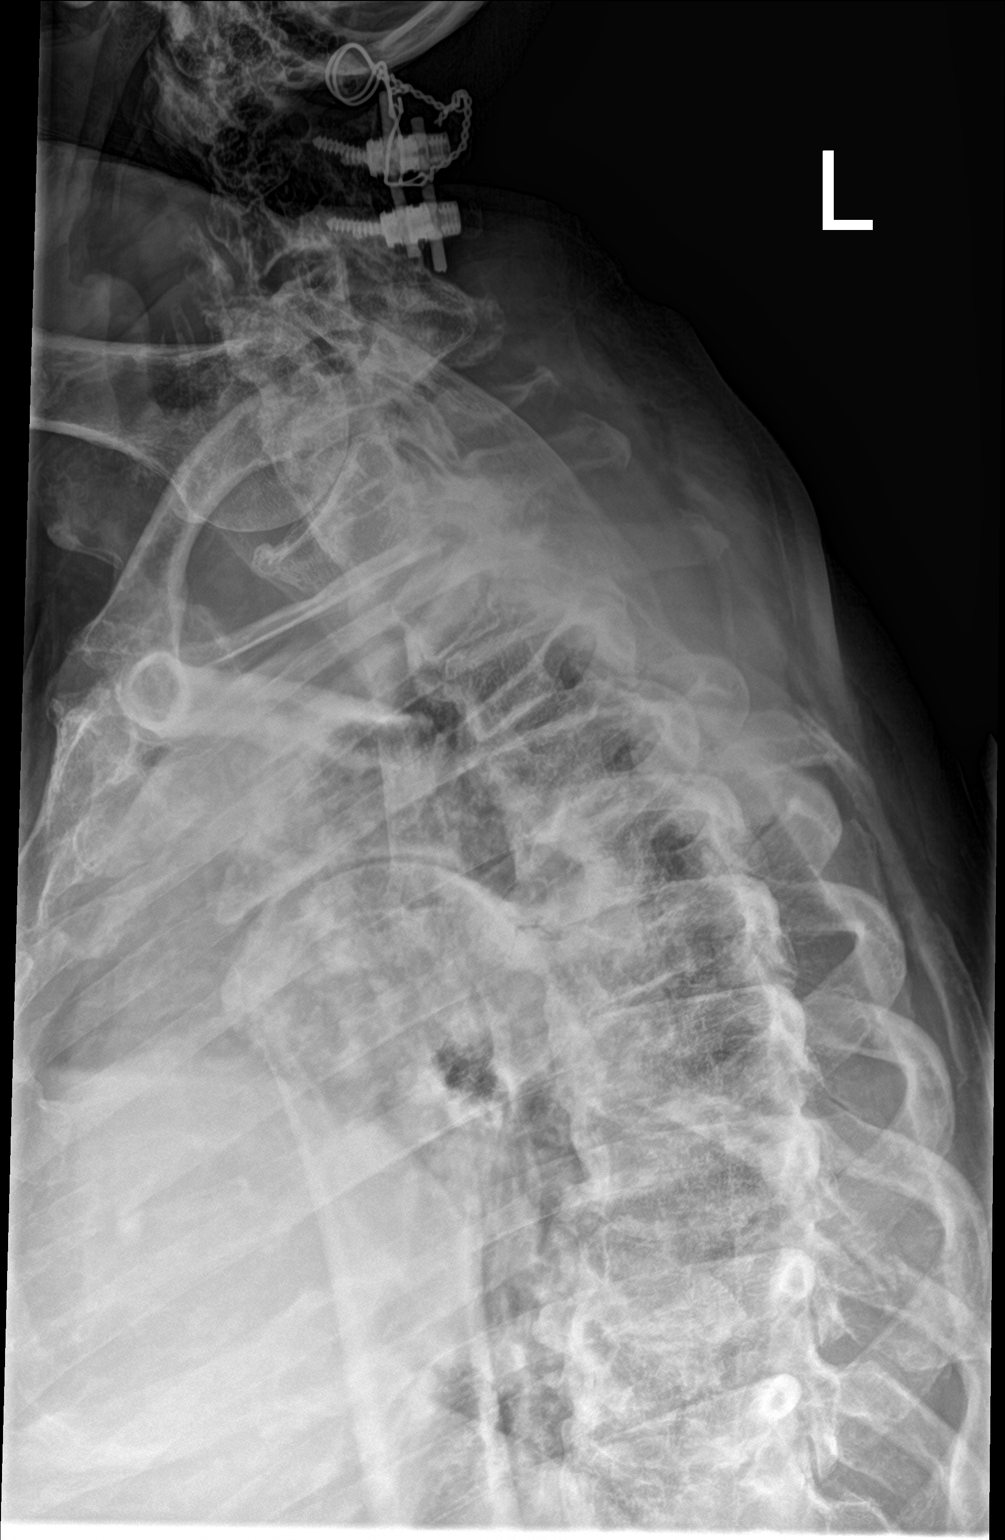

[3 of 3 positions shown; findings below may reference images not displayed]

FINDINGS: Patchy osteopenic appearance of the thoracic spine with chronic
superior endplate compression of T5 unchanged in appearance. Lytic
appearance of T5 is stable. Small lytic lucencies are also noted of
T3 and T4, similar in appearance to prior CT. No acute osseous
abnormality. Slightly expansile stable focal abnormality of what
corresponds with the right fifth rib on prior CT is seen on the
lateral view and largely unchanged in appearance.
IMPRESSION: 1. Chronic stable lytic abnormalities of the upper thoracic spine in
keeping with the patient's known multiple myeloma. Superior endplate
compression of T5 appears stable.
2. Focal bony expansion of what corresponds with right fifth rib is
unchanged relative to prior CT.
3. No acute osseous appearing abnormality.

## 2019-12-06 DIAGNOSIS — M21612 Bunion of left foot: Secondary | ICD-10-CM | POA: Diagnosis not present

## 2019-12-06 DIAGNOSIS — M48061 Spinal stenosis, lumbar region without neurogenic claudication: Secondary | ICD-10-CM | POA: Diagnosis not present

## 2019-12-06 DIAGNOSIS — Z5111 Encounter for antineoplastic chemotherapy: Secondary | ICD-10-CM | POA: Diagnosis not present

## 2019-12-06 DIAGNOSIS — Z923 Personal history of irradiation: Secondary | ICD-10-CM | POA: Diagnosis not present

## 2019-12-06 DIAGNOSIS — I10 Essential (primary) hypertension: Secondary | ICD-10-CM | POA: Diagnosis not present

## 2019-12-06 DIAGNOSIS — M21611 Bunion of right foot: Secondary | ICD-10-CM | POA: Diagnosis not present

## 2019-12-06 DIAGNOSIS — Z7982 Long term (current) use of aspirin: Secondary | ICD-10-CM | POA: Diagnosis not present

## 2019-12-06 DIAGNOSIS — C9000 Multiple myeloma not having achieved remission: Secondary | ICD-10-CM | POA: Diagnosis not present

## 2019-12-06 DIAGNOSIS — Z8546 Personal history of malignant neoplasm of prostate: Secondary | ICD-10-CM | POA: Diagnosis not present

## 2019-12-06 DIAGNOSIS — M47816 Spondylosis without myelopathy or radiculopathy, lumbar region: Secondary | ICD-10-CM | POA: Diagnosis not present

## 2019-12-06 DIAGNOSIS — Z79899 Other long term (current) drug therapy: Secondary | ICD-10-CM | POA: Diagnosis not present

## 2019-12-06 DIAGNOSIS — M2011 Hallux valgus (acquired), right foot: Secondary | ICD-10-CM | POA: Diagnosis not present

## 2019-12-06 DIAGNOSIS — C674 Malignant neoplasm of posterior wall of bladder: Secondary | ICD-10-CM | POA: Diagnosis not present

## 2019-12-09 ENCOUNTER — Other Ambulatory Visit: Payer: Self-pay | Admitting: Family Medicine

## 2019-12-13 DIAGNOSIS — C9000 Multiple myeloma not having achieved remission: Secondary | ICD-10-CM | POA: Diagnosis not present

## 2019-12-19 ENCOUNTER — Telehealth: Payer: Self-pay

## 2019-12-19 ENCOUNTER — Other Ambulatory Visit: Payer: Self-pay

## 2019-12-19 DIAGNOSIS — L03119 Cellulitis of unspecified part of limb: Secondary | ICD-10-CM | POA: Diagnosis not present

## 2019-12-19 DIAGNOSIS — L02419 Cutaneous abscess of limb, unspecified: Secondary | ICD-10-CM | POA: Diagnosis not present

## 2019-12-19 DIAGNOSIS — C9000 Multiple myeloma not having achieved remission: Secondary | ICD-10-CM | POA: Diagnosis not present

## 2019-12-19 MED ORDER — LISINOPRIL 30 MG PO TABS: 30.0000 mg | ORAL_TABLET | Freq: Every day | ORAL | 3 refills | Status: AC

## 2019-12-19 NOTE — Telephone Encounter (Signed)
Left detailed message advising refill sent for 90 day supply but he is currently due for next routine f/u with PCP.

## 2019-12-19 NOTE — Telephone Encounter (Signed)
Patient requests next Rx for Lisinopril to be 90 days. He has just received a 30 day supply in the mail.

## 2019-12-23 NOTE — Telephone Encounter (Signed)
Patient calling back about his Lisinopril prescription. Patient requests next Rx for Lisinopril to be 90 days. He has just received a 30 day supply in the mail.   Please call tomorrow [phone]

## 2019-12-24 NOTE — Telephone Encounter (Signed)
Patient advised 90 day supply sent and will be on file until current refill runs out. Offered to make next f/u appt and scheduled for 7/30

## 2019-12-26 ENCOUNTER — Other Ambulatory Visit: Payer: Self-pay

## 2019-12-27 ENCOUNTER — Ambulatory Visit (INDEPENDENT_AMBULATORY_CARE_PROVIDER_SITE_OTHER): Payer: Medicare Other | Admitting: Family Medicine

## 2019-12-27 ENCOUNTER — Other Ambulatory Visit: Payer: Self-pay

## 2019-12-27 ENCOUNTER — Encounter: Payer: Self-pay | Admitting: Family Medicine

## 2019-12-27 VITALS — BP 105/62 | HR 114 | Temp 97.9°F | Resp 16 | Ht 70.0 in | Wt 258.6 lb

## 2019-12-27 DIAGNOSIS — G4700 Insomnia, unspecified: Secondary | ICD-10-CM

## 2019-12-27 DIAGNOSIS — I10 Essential (primary) hypertension: Secondary | ICD-10-CM

## 2019-12-27 NOTE — Progress Notes (Signed)
OFFICE VISIT  12/27/2019   CC:  Chief Complaint  Patient presents with  . Follow-up    RCI, pt is not fasting   HPI:    Patient is a 79 y.o. Caucasian male who presents for f/u HTN, prediabetes, insomnia. A/P as of last visit: "1) Anger control problem, excessive irritability. Hx of anxiety and likely MDD, ? Bipolar II? Ultimately, at this time the biggest issue is that pt and his wife have a horrible marriage (they agree) and I told him I don't want him taking lamotrigine if he's taking it only to appease his wife.  He seems to think it may be helping some but is leaning towards stopping the med. I told him that if he continues to take it, his dose is 169m qd now, f/u in 1 mo. If he decides to NOT continue lamictal then I recommended he take 1/2 of 106mtab qod x 7 doses then stop. No other med changes recommended today.  2) HTN: The current medical regimen is effective;  continue present plan and medications. Lytes/cr today.  3) Prediabetes: monitor glucose and Hba1c today."  INTERIM HX: Feeling fine. [facility] or [facility] little high.  No known low bps. No dizziness, no acute fatigue.  No palpitations, CP, or SOB. Gets some fatigue with his MM treatments. Says labs weekly at these treatments were normal per what they told him except mild low Hb. No change in mood or anxiety.  Sleep is fine as long as he takes quetiapine and prn ambien. PMP AWARE reviewed today: most recent rx for ambien was filled 08/29/19, # 30, rx by [facility] GlancePrior to that was 03/20/19, #30, rx by An[facility]alli Glance  No red flags.   Past Medical History:  Diagnosis Date  . [place]   [facility] three times per week as of 01/2017.  . Bl[place]   07/10/2019 TURBT/gemcitabine instillation - LG, Ta, no muscle present  . Bone lesion   . Depression   . Fall 01/06/2018   pt fell wearing crocs while going up muddy  embankment and hit head,no LOC went to ER no fractures pt sent home  . Family history of colon cancer in mother   . 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 Hyperlipidemia    Pt declined statin 08/2017  . Hypertension   . Insomnia    uses quetiapine for this  . [place] ([facility]   [facility]  . Osteoarthritis    C-spine--hx of fusion surgery.  R foot. Knees (hx of L TKA)  . Prediabetes   . [facility]    Past Surgical History:  Procedure Laterality Date  . ANTERIOR FUSION CERVICAL SPINE  2002  . [facility] stem cell [facility]  [facility]  [facility]   [facility] [place] CYSTOSCOPY  07/05/2019   for gross hematuria; noninvasive-appearing tumor found on cystoscopy->plan for TURBT.  . Marland KitchenXCISIONAL TOTAL KNEE ARTHROPLASTY  2014 and 2016   Both knees  . LAPAROSCOPIC RETROPUBIC PROSTATECTOMY     + hx of radiation to pelvis.  . 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  Procedure: LEFT EXCISION MASS;  Surgeon: TeLeta BaptistMD;  Location:  [place];  Service: ENT;  Laterality: Left;  . SKIN FULL THICKNESS GRAFT Left 01/31/2018   Procedure: SKIN GRAFT FULL THICKNESS;  Surgeon: Leta Baptist, MD;  Location: [place];  Service: ENT;  Laterality: Left;  . 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 acyclovir (ZOVIRAX) 400 MG tablet TAKE ONE TABLET BY MOUTH DAILY 30 tablet 6  . amLODipine (NORVASC) 10 MG tablet TAKE ONE TABLET BY MOUTH EVERY DAY 90 tablet 0  . amoxicillin-clavulanate (AUGMENTIN) 875-125 MG tablet Take by mouth.    . ondansetron (ZOFRAN) 8 MG tablet TAKE 1  TABLET (8 MG TOTAL) BY MOUTH EVERY 8 (EIGHT) HOURS AS NEEDED FOR NAUSEA FOR UP TO 30 DOSES.  3  . QUEtiapine (SEROQUEL) 100 MG tablet TAKE ONE TABLET BY MOUTH AT BEDTIME 90 tablet 3  . zolendronic acid (ZOMETA) 4 MG/5ML injection Inject into the vein.    Marland Kitchen zolpidem (AMBIEN) 10 MG tablet Take 10 mg by mouth at bedtime as needed.    . desonide (DESOWEN) 0.05 % ointment Apply 1 application topically as needed. (Patient not taking: Reported on 12/27/2019)    . ibuprofen (ADVIL) 800 MG tablet Take 800 mg by mouth every 8 (eight) hours as needed. (Patient not taking: Reported on 12/27/2019)    . lisinopril (ZESTRIL) 30 MG tablet Take 1 tablet (30 mg total) by mouth daily. 90 tablet 3  . Fluocinolone Acetonide Body 0.01 % OIL Apply daily to affected areas. Stop when smooth. (Patient not taking: Reported on 12/27/2019)    . FLUoxetine (PROZAC) 40 MG capsule TAKE ONE CAPSULE BY MOUTH EVERY DAY (Patient not taking: Reported on 12/27/2019) 90 capsule 0  . lamoTRIgine (LAMICTAL) 100 MG tablet Take 1 tablet (100 mg total) by mouth daily. (Patient not taking: Reported on 12/27/2019) 30 tablet 0  . oxybutynin (DITROPAN) 5 MG tablet Take 5 mg by mouth 3 (three) times daily as needed. (Patient not taking: Reported on 12/27/2019)    . pantoprazole (PROTONIX) 40 MG tablet Take 40 mg by mouth daily. (Patient not taking: Reported on 12/27/2019)    . prochlorperazine (COMPAZINE) 10 MG tablet Take 10 mg by mouth every 6 (six) hours as needed. (Patient not taking: Reported on 12/27/2019)     No facility-administered medications prior to visit.    Allergies  Allergen Reactions  . Tamiflu [Oseltamivir] Nausea And Vomiting    ROS As per HPI  PE: Vitals with BMI 12/27/2019 10/10/2019 09/12/2019  Height '5\' 10"'  '5\' 10"'  '5\' 10"'   Weight 258 lbs 10 oz 257 lbs 10 oz 254 lbs 13 oz  BMI 37.11 51.02 58.52  Systolic 778 242 353  Diastolic 62 69 75  Pulse 614 69 63  O2 sat on RA is 95%  Gen: Alert, well appearing.  Patient is  oriented to person, place, time, and situation. AFFECT: pleasant, lucid thought and speech. CV: RRR (rate 75-80 by me), no m/r/g.   LUNGS: CTA bilat, nonlabored resps, good aeration in all lung fields. EXT: no clubbing or cyanosis.  no edema.    LABS:  No results found for: TSH Lab Results  Component Value Date   WBC 5.3 06/20/2019   HGB 12.3 (L) 06/20/2019   HCT 37.2 (L) 06/20/2019   MCV 91.0 06/20/2019   PLT 198.0 06/20/2019  No results found for: IRON,  TIBC, FERRITIN No results found for: VITAMINB12  Lab Results  Component Value Date   CREATININE 0.95 09/12/2019   BUN 20 09/12/2019   NA 135 09/12/2019   K 4.6 09/12/2019   CL 99 09/12/2019   CO2 29 09/12/2019   Lab Results  Component Value Date   ALT 19 06/12/2019   AST 20 06/12/2019   ALKPHOS 61 06/12/2019   BILITOT 0.4 09/08/2017   Lab Results  Component Value Date   CHOL 255 (H) 09/08/2017   Lab Results  Component Value Date   HDL 33.40 (L) 09/08/2017   Lab Results  Component Value Date   LDLCALC 184 (H) 09/08/2017   Lab Results  Component Value Date   TRIG 189.0 (H) 09/08/2017   Lab Results  Component Value Date   CHOLHDL 8 09/08/2017   Lab Results  Component Value Date   HGBA1C 5.9 09/12/2019    IMPRESSION AND PLAN:  1) HTN: stable.  bP a little soft today but he is asymptomatic and this is not a pattern for him. NO change in meds today. No labs today.  2) Prediab: stable a1c 3 mo ago.  Plan recheck a1c 6 mo. He is working some on TLC.  3) Insomnia: stable on quetiapine (rx'd by me) and prn ambien (rx'd by provider at [facility]).  An After Visit Summary was printed and given to the patient.  FOLLOW UP: No follow-ups on file.  Signed:  Crissie Sickles, MD           12/27/2019

## 2019-12-30 ENCOUNTER — Telehealth: Payer: Self-pay

## 2019-12-30 NOTE — Telephone Encounter (Signed)
Wife Lucita Ferrara called (DPR) about husband. Says he has been drinking a lot. He stays drunk all the time. He lies about his drinking problem.  She says he needs help asap.  She doesn't know what to do anymore, doesn't know where to go or who to call. Wife wants to send husband somewhere today!  Please call her at [phone]

## 2019-12-30 NOTE — Telephone Encounter (Signed)
noted 

## 2019-12-30 NOTE — Telephone Encounter (Signed)
First step: he needs to go to emergency department for evaluation to see if detoxification is needed. This has to be done before any inpatient rehab facility will consider accepting him. Tell her I'm sorry but this is the best step at this time.

## 2019-12-30 NOTE — Telephone Encounter (Signed)
Please advise, thanks.

## 2019-12-30 NOTE — Telephone Encounter (Signed)
Sent as FYI. 

## 2019-12-30 NOTE — Telephone Encounter (Signed)
Patient's wife called in saying to forget it, appreciated the help but Derek Castro is refusing treatment and does not want to force him.

## 2019-12-31 ENCOUNTER — Other Ambulatory Visit: Payer: Self-pay | Admitting: Family Medicine

## 2020-01-02 DIAGNOSIS — C9000 Multiple myeloma not having achieved remission: Secondary | ICD-10-CM | POA: Diagnosis not present

## 2020-01-02 DIAGNOSIS — L219 Seborrheic dermatitis, unspecified: Secondary | ICD-10-CM | POA: Diagnosis not present

## 2020-01-02 DIAGNOSIS — T148XXA Other injury of unspecified body region, initial encounter: Secondary | ICD-10-CM | POA: Diagnosis not present

## 2020-01-02 DIAGNOSIS — L987 Excessive and redundant skin and subcutaneous tissue: Secondary | ICD-10-CM | POA: Diagnosis not present

## 2020-01-02 DIAGNOSIS — Z923 Personal history of irradiation: Secondary | ICD-10-CM | POA: Diagnosis not present

## 2020-01-02 DIAGNOSIS — R454 Irritability and anger: Secondary | ICD-10-CM | POA: Diagnosis not present

## 2020-01-02 DIAGNOSIS — L03116 Cellulitis of left lower limb: Secondary | ICD-10-CM | POA: Diagnosis not present

## 2020-01-02 DIAGNOSIS — I878 Other specified disorders of veins: Secondary | ICD-10-CM | POA: Diagnosis not present

## 2020-01-02 DIAGNOSIS — Z8546 Personal history of malignant neoplasm of prostate: Secondary | ICD-10-CM | POA: Diagnosis not present

## 2020-01-02 DIAGNOSIS — S81802D Unspecified open wound, left lower leg, subsequent encounter: Secondary | ICD-10-CM | POA: Diagnosis not present

## 2020-01-02 DIAGNOSIS — R310 Gross hematuria: Secondary | ICD-10-CM | POA: Diagnosis not present

## 2020-01-02 DIAGNOSIS — C679 Malignant neoplasm of bladder, unspecified: Secondary | ICD-10-CM | POA: Diagnosis not present

## 2020-01-02 DIAGNOSIS — I872 Venous insufficiency (chronic) (peripheral): Secondary | ICD-10-CM | POA: Diagnosis not present

## 2020-01-02 DIAGNOSIS — I10 Essential (primary) hypertension: Secondary | ICD-10-CM | POA: Diagnosis not present

## 2020-01-02 DIAGNOSIS — L570 Actinic keratosis: Secondary | ICD-10-CM | POA: Diagnosis not present

## 2020-01-02 DIAGNOSIS — M79671 Pain in right foot: Secondary | ICD-10-CM | POA: Diagnosis not present

## 2020-01-02 DIAGNOSIS — L989 Disorder of the skin and subcutaneous tissue, unspecified: Secondary | ICD-10-CM | POA: Diagnosis not present

## 2020-01-09 DIAGNOSIS — C9000 Multiple myeloma not having achieved remission: Secondary | ICD-10-CM | POA: Diagnosis not present

## 2020-01-09 DIAGNOSIS — Z5111 Encounter for antineoplastic chemotherapy: Secondary | ICD-10-CM | POA: Diagnosis not present

## 2020-01-18 ENCOUNTER — Emergency Department (HOSPITAL_BASED_OUTPATIENT_CLINIC_OR_DEPARTMENT_OTHER): Payer: Medicare Other

## 2020-01-18 ENCOUNTER — Other Ambulatory Visit: Payer: Self-pay

## 2020-01-18 ENCOUNTER — Encounter (HOSPITAL_BASED_OUTPATIENT_CLINIC_OR_DEPARTMENT_OTHER): Payer: Self-pay | Admitting: Emergency Medicine

## 2020-01-18 ENCOUNTER — Emergency Department (HOSPITAL_BASED_OUTPATIENT_CLINIC_OR_DEPARTMENT_OTHER)
Admission: EM | Admit: 2020-01-18 | Discharge: 2020-01-18 | Disposition: A | Discharge status: Home / Self Care | Payer: Medicare Other | Attending: Emergency Medicine | Admitting: Emergency Medicine

## 2020-01-18 DIAGNOSIS — C9002 Multiple myeloma in relapse: Secondary | ICD-10-CM | POA: Diagnosis not present

## 2020-01-18 DIAGNOSIS — I10 Essential (primary) hypertension: Secondary | ICD-10-CM | POA: Diagnosis not present

## 2020-01-18 DIAGNOSIS — I517 Cardiomegaly: Secondary | ICD-10-CM | POA: Insufficient documentation

## 2020-01-18 DIAGNOSIS — R05 Cough: Secondary | ICD-10-CM | POA: Diagnosis not present

## 2020-01-18 DIAGNOSIS — I700 Atherosclerosis of aorta: Secondary | ICD-10-CM | POA: Insufficient documentation

## 2020-01-18 DIAGNOSIS — J189 Pneumonia, unspecified organism: Secondary | ICD-10-CM | POA: Insufficient documentation

## 2020-01-18 DIAGNOSIS — Z79899 Other long term (current) drug therapy: Secondary | ICD-10-CM | POA: Diagnosis not present

## 2020-01-18 DIAGNOSIS — Z8551 Personal history of malignant neoplasm of bladder: Secondary | ICD-10-CM | POA: Insufficient documentation

## 2020-01-18 DIAGNOSIS — Z8546 Personal history of malignant neoplasm of prostate: Secondary | ICD-10-CM | POA: Insufficient documentation

## 2020-01-18 DIAGNOSIS — S2241XA Multiple fractures of ribs, right side, initial encounter for closed fracture: Secondary | ICD-10-CM | POA: Diagnosis not present

## 2020-01-18 DIAGNOSIS — Z20822 Contact with and (suspected) exposure to covid-19: Secondary | ICD-10-CM | POA: Insufficient documentation

## 2020-01-18 DIAGNOSIS — R9431 Abnormal electrocardiogram [ECG] [EKG]: Secondary | ICD-10-CM | POA: Diagnosis not present

## 2020-01-18 DIAGNOSIS — Z87891 Personal history of nicotine dependence: Secondary | ICD-10-CM | POA: Insufficient documentation

## 2020-01-18 DIAGNOSIS — I358 Other nonrheumatic aortic valve disorders: Secondary | ICD-10-CM | POA: Diagnosis not present

## 2020-01-18 DIAGNOSIS — J181 Lobar pneumonia, unspecified organism: Secondary | ICD-10-CM | POA: Diagnosis not present

## 2020-01-18 LAB — COMPREHENSIVE METABOLIC PANEL
ALT: 17 U/L (ref 0–44)
AST: 23 U/L (ref 15–41)
Albumin: 3.8 g/dL (ref 3.5–5.0)
Alkaline Phosphatase: 69 U/L (ref 38–126)
Anion gap: 9 (ref 5–15)
BUN: 14 mg/dL (ref 8–23)
CO2: 27 mmol/L (ref 22–32)
Calcium: 9 mg/dL (ref 8.9–10.3)
Chloride: 102 mmol/L (ref 98–111)
Creatinine, Ser: 1.03 mg/dL (ref 0.61–1.24)
GFR calc Af Amer: >60 mL/min (ref >60)
GFR calc non Af Amer: >60 mL/min (ref >60)
Glucose, Bld: 119 mg/dL — ABNORMAL HIGH (ref 70–99)
Potassium: 4.8 mmol/L (ref 3.5–5.1)
Sodium: 138 mmol/L (ref 135–145)
Total Bilirubin: 0.7 mg/dL (ref 0.3–1.2)
Total Protein: 6.7 g/dL (ref 6.5–8.1)

## 2020-01-18 LAB — CBC WITH DIFFERENTIAL/PLATELET
Abs Immature Granulocytes: 0.04 10*3/uL (ref 0.00–0.07)
Basophils Absolute: 0 10*3/uL (ref 0.0–0.1)
Basophils Relative: 0 %
Eosinophils Absolute: 0.1 10*3/uL (ref 0.0–0.5)
Eosinophils Relative: 1 %
HCT: 35.9 % — ABNORMAL LOW (ref 39.0–52.0)
Hemoglobin: 11.5 g/dL — ABNORMAL LOW (ref 13.0–17.0)
Immature Granulocytes: 1 %
Lymphocytes Relative: 15 %
Lymphs Abs: 1.2 10*3/uL (ref 0.7–4.0)
MCH: 29.9 pg (ref 26.0–34.0)
MCHC: 32 g/dL (ref 30.0–36.0)
MCV: 93.5 fL (ref 80.0–100.0)
Monocytes Absolute: 1 10*3/uL (ref 0.1–1.0)
Monocytes Relative: 12 %
Neutro Abs: 5.8 10*3/uL (ref 1.7–7.7)
Neutrophils Relative %: 71 %
Platelets: 193 10*3/uL (ref 150–400)
RBC: 3.84 MIL/uL — ABNORMAL LOW (ref 4.22–5.81)
RDW: 14.9 % (ref 11.5–15.5)
WBC: 8.1 10*3/uL (ref 4.0–10.5)
nRBC: 0 % (ref 0.0–0.2)

## 2020-01-18 LAB — LACTIC ACID, PLASMA: Lactic Acid, Venous: 1.3 mmol/L (ref 0.5–1.9)

## 2020-01-18 LAB — URINALYSIS, ROUTINE W REFLEX MICROSCOPIC
Bilirubin Urine: NEGATIVE
Glucose, UA: NEGATIVE mg/dL
Hgb urine dipstick: NEGATIVE
Ketones, ur: NEGATIVE mg/dL
Leukocytes,Ua: NEGATIVE
Nitrite: NEGATIVE
Protein, ur: NEGATIVE mg/dL
Specific Gravity, Urine: 1.01 (ref 1.005–1.030)
pH: 7 (ref 5.0–8.0)

## 2020-01-18 LAB — PROTIME-INR
INR: 1 (ref 0.8–1.2)
Prothrombin Time: 12.3 seconds (ref 11.4–15.2)

## 2020-01-18 LAB — APTT: aPTT: 29 seconds (ref 24–36)

## 2020-01-18 LAB — SARS CORONAVIRUS 2 BY RT PCR (HOSPITAL ORDER, PERFORMED IN ~~LOC~~ HOSPITAL LAB): SARS Coronavirus 2: NEGATIVE

## 2020-01-18 MED ORDER — SODIUM CHLORIDE 0.9 % IV SOLN
2.0000 g | Freq: Once | INTRAVENOUS | Status: AC
  Administered 2020-01-18: 2 g via INTRAVENOUS
  Filled 2020-01-18: qty 2

## 2020-01-18 MED ORDER — ACETAMINOPHEN 500 MG PO TABS
1000.0000 mg | ORAL_TABLET | Freq: Once | ORAL | Status: AC
  Administered 2020-01-18: 1000 mg via ORAL
  Filled 2020-01-18: qty 2

## 2020-01-18 MED ORDER — VANCOMYCIN HCL IN DEXTROSE 1-5 GM/200ML-% IV SOLN: 1000.0000 mg | Freq: Once | INTRAVENOUS | Status: DC

## 2020-01-18 MED ORDER — SODIUM CHLORIDE 0.9 % IV BOLUS
1000.0000 mL | Freq: Once | INTRAVENOUS | Status: AC
  Administered 2020-01-18: 1000 mL via INTRAVENOUS

## 2020-01-18 MED ORDER — LACTATED RINGERS IV BOLUS (SEPSIS)
1000.0000 mL | Freq: Once | INTRAVENOUS | Status: DC
  Administered 2020-01-18: 1000 mL via INTRAVENOUS

## 2020-01-18 MED ORDER — SODIUM CHLORIDE 0.9 % IV SOLN: 2.0000 g | Freq: Three times a day (TID) | INTRAVENOUS | Status: DC

## 2020-01-18 MED ORDER — IOHEXOL 350 MG/ML SOLN
100.0000 mL | Freq: Once | INTRAVENOUS | Status: AC | PRN
  Administered 2020-01-18: 100 mL via INTRAVENOUS

## 2020-01-18 MED ORDER — DOXYCYCLINE HYCLATE 100 MG PO CAPS: 100.0000 mg | ORAL_CAPSULE | Freq: Two times a day (BID) | ORAL | 0 refills | Status: DC

## 2020-01-18 MED ORDER — VANCOMYCIN HCL IN DEXTROSE 1-5 GM/200ML-% IV SOLN
1000.0000 mg | Freq: Once | INTRAVENOUS | Status: AC
  Administered 2020-01-18: 1000 mg via INTRAVENOUS
  Filled 2020-01-18: qty 200

## 2020-01-18 MED ORDER — SODIUM CHLORIDE 0.9 % IV SOLN: INTRAVENOUS | Status: DC | PRN

## 2020-01-18 MED ORDER — VANCOMYCIN HCL 2000 MG/400ML IV SOLN
2000.0000 mg | INTRAVENOUS | Status: DC
  Filled 2020-01-18: qty 400

## 2020-01-18 NOTE — ED Triage Notes (Signed)
Productive Cough and congestion since yesterday, body aches. He is fully vaccinated against COVID. Currently receiving chemo for multiple myeloma. Denies SOB

## 2020-01-18 NOTE — ED Provider Notes (Signed)
Derek Castro   CSN: [id number] Arrival date & time: 01/18/20  0757     History Chief Complaint  Patient presents with  . Cough    Derek Castro is a 79 y.o. male.  The history is provided by the patient.  Cough Cough characteristics:  Productive Sputum characteristics:  Nondescript Severity:  Mild Onset quality:  Gradual Duration:  2 days Timing:  Constant Progression:  Unchanged Chronicity:  New Context: upper respiratory infection   Relieved by:  Nothing Worsened by:  Nothing Associated symptoms: no chest pain, no chills, no ear pain, no fever, no rash, no shortness of breath and no sore throat   Risk factors comment:  Multiple myeloma history of chemotherapy last recieved 8/12. Has had covid vaccine      Past Medical History:  Diagnosis Date  . Alcoholism in remission ([place])    [facility] counseling three times per week as of 01/2017.  . Bladder cancer ([place]) 07/2019   07/10/2019 TURBT/gemcitabine instillation - LG, Ta, no muscle present  . Bone lesion   . Depression   . Fall 01/06/2018   pt fell wearing crocs while going up muddy embankment and hit head,no LOC went to ER no fractures pt sent home  . Family history of colon cancer in mother   . 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 Hyperlipidemia    Pt declined statin 08/2017  . Hypertension   . Insomnia    uses quetiapine for this  . Multiple myeloma ([place])    [facility]  . Osteoarthritis    C-spine--hx of fusion surgery.  R foot. Knees (hx of L TKA)  . Prediabetes   . Prostate cancer [facility])    [facility]    Patient Active Problem List   Diagnosis Date Noted  . 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 Bladder tumor 07/07/2019  . 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 Depression   . Ganglion  cyst 04/09/2017  . Erectile dysfunction 12/20/2016  . Osteoarthrosis, ankle and foot 06/03/2016  . Tibialis posterior dysfunction 06/03/2016  . Dysuria 06/02/2016  . Malignant neoplasm of prostate ([place]) 03/28/2016  . History of prostate cancer 01/20/2016  . Bone lesion 08/12/2015  . Anemia due to chemotherapy 07/13/2015  . Pain in left knee 11/24/2014  . Multiple myeloma in relapse ([place]) 07/04/2014    Past Surgical History:  Procedure Laterality Date  . ANTERIOR FUSION CERVICAL SPINE  2002  . autologous stem cell transplant  2010 and 2012   [facility] in [place].  Marland Kitchen COLONOSCOPY  2015   Recall 5 yrs ([facility] in [place], [place])  . CYSTOSCOPY  07/05/2019   for gross hematuria; noninvasive-appearing tumor found on cystoscopy->plan for TURBT.  Marland Kitchen EXCISIONAL TOTAL KNEE ARTHROPLASTY  2014 and 2016   Both knees  . LAPAROSCOPIC RETROPUBIC PROSTATECTOMY     + hx of radiation to pelvis.  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  Procedure: LEFT EXCISION MASS;  Surgeon: Leta Baptist, MD;  Location: [place];  Service: ENT;  Laterality: Left;  . SKIN FULL THICKNESS GRAFT Left 01/31/2018   Procedure: SKIN GRAFT FULL THICKNESS;  Surgeon: Leta Baptist, MD;  Location: [place];  Service: ENT;  Laterality: Left;  . TRANSURETHRAL RESECTION OF BLADDER TUMOR  WITH GYRUS (TURBT-GYRUS)  07/10/2019   07/10/2019 TURBT/gemcitabine instillation - LG, Ta, no muscle present       Family History  Problem Relation Age of Onset  . Cancer Mother        colon    Social History   Tobacco Use  . Smoking status: Former Smoker    Packs/day: 1.00    Years: 5.00    Pack years: 5.00    Types: Cigarettes    Quit date: 02/11/1996    Years since quitting: 23.9  . Smokeless tobacco: Never Used  Vaping Use  . Vaping Use: Never used  Substance Use Topics  . Alcohol use: No  . Drug use: No    Home Medications Prior to Admission  medications   Medication Sig Start Date End Date Taking? Authorizing Provider  acyclovir (ZOVIRAX) 400 MG tablet TAKE ONE TABLET BY MOUTH DAILY 06/14/19   McGowen, Adrian Blackwater, MD  amLODipine (NORVASC) 10 MG tablet TAKE ONE TABLET BY MOUTH EVERY DAY 01/01/20   McGowen, Adrian Blackwater, MD  desonide (DESOWEN) 0.05 % ointment Apply 1 application topically as needed. Patient not taking: Reported on 12/27/2019    [provider]  doxycycline (VIBRAMYCIN) 100 MG capsule Take 1 capsule (100 mg total) by mouth 2 (two) times daily for 10 days. 01/18/20 01/28/20  Yarely Bebee, DO  ibuprofen (ADVIL) 800 MG tablet Take 800 mg by mouth every 8 (eight) hours as needed. Patient not taking: Reported on 12/27/2019 07/10/19   [provider]  lisinopril (ZESTRIL) 30 MG tablet Take 1 tablet (30 mg total) by mouth daily. 12/19/19   McGowen, Adrian Blackwater, MD  ondansetron (ZOFRAN) 8 MG tablet TAKE 1 TABLET (8 MG TOTAL) BY MOUTH EVERY 8 (EIGHT) HOURS AS NEEDED FOR NAUSEA FOR UP TO 30 DOSES. 01/18/16   [provider]  QUEtiapine (SEROQUEL) 100 MG tablet TAKE ONE TABLET BY MOUTH AT BEDTIME 07/31/19   McGowen, Adrian Blackwater, MD  zolendronic acid (ZOMETA) 4 MG/5ML injection Inject into the vein.    [provider]  zolpidem (AMBIEN) 10 MG tablet Take 10 mg by mouth at bedtime as needed. 03/20/19   [provider]    Allergies    Tamiflu [oseltamivir]  Review of Systems   Review of Systems  Constitutional: Negative for chills and fever.  HENT: Positive for congestion. Negative for ear pain and sore throat.   Eyes: Negative for pain and visual disturbance.  Respiratory: Positive for cough. Negative for shortness of breath.   Cardiovascular: Negative for chest pain and palpitations.  Gastrointestinal: Negative for abdominal pain and vomiting.  Genitourinary: Negative for dysuria and hematuria.  Musculoskeletal: Negative for arthralgias and back pain.  Skin: Negative for color change and rash.    Neurological: Negative for seizures and syncope.  All other systems reviewed and are negative.   Physical Exam Updated Vital Signs  ED Triage Vitals  Enc Vitals Group     BP 01/18/20 0807 (!) 139/59     Pulse Rate 01/18/20 0807 76     Resp 01/18/20 0807 (!) 22     Temp 01/18/20 0807 (!) 100.4 F (38 C)     Temp Source 01/18/20 0807 Oral     SpO2 01/18/20 0807 95 %     Weight 01/18/20 0805 250 lb (113.4 kg)     Height 01/18/20 0805 '5\' 9"'  (1.753 m)     Head Circumference --      Peak Flow --  Pain Score 01/18/20 0805 2     Pain Loc --      Pain Edu? --      Excl. in [place]? --     Physical Exam Vitals and nursing Castro reviewed.  Constitutional:      General: He is not in acute distress.    Appearance: He is well-developed. He is not ill-appearing.  HENT:     Head: Normocephalic and atraumatic.     Nose: Nose normal.     Mouth/Throat:     Mouth: Mucous membranes are moist.  Eyes:     Extraocular Movements: Extraocular movements intact.     Conjunctiva/sclera: Conjunctivae normal.     Pupils: Pupils are equal, round, and reactive to light.  Cardiovascular:     Rate and Rhythm: Normal rate and regular rhythm.     Pulses: Normal pulses.     Heart sounds: Normal heart sounds. No murmur heard.   Pulmonary:     Effort: Pulmonary effort is normal. No respiratory distress.     Breath sounds: Normal breath sounds.  Abdominal:     General: Abdomen is flat.     Palpations: Abdomen is soft.     Tenderness: There is no abdominal tenderness.  Musculoskeletal:        General: Normal range of motion.     Cervical back: Normal range of motion and neck supple.  Skin:    General: Skin is warm and dry.     Capillary Refill: Capillary refill takes less than 2 seconds.  Neurological:     General: No focal deficit present.     Mental Status: He is alert and oriented to person, place, and time.     Cranial Nerves: No cranial nerve deficit.     Sensory: No sensory deficit.      Motor: No weakness.     Coordination: Coordination normal.     Comments: 5+ out of 5 strength throughout, normal sensation, normal finger-to-nose finger  Psychiatric:        Mood and Affect: Mood normal.     ED Results / Procedures / Treatments   Labs (all labs ordered are listed, but only abnormal results are displayed) Labs Reviewed  COMPREHENSIVE METABOLIC PANEL - Abnormal; Notable for the following components:      Result Value   Glucose, Bld 119 (*)    All other components within normal limits  CBC WITH DIFFERENTIAL/PLATELET - Abnormal; Notable for the following components:   RBC 3.84 (*)    Hemoglobin 11.5 (*)    HCT 35.9 (*)    All other components within normal limits  SARS CORONAVIRUS 2 BY RT PCR (HOSPITAL ORDER, [place] [facility])  CULTURE, BLOOD (ROUTINE X 2)  CULTURE, BLOOD (ROUTINE X 2)  URINE CULTURE  LACTIC ACID, PLASMA  PROTIME-INR  APTT  URINALYSIS, ROUTINE W REFLEX MICROSCOPIC  LACTIC ACID, PLASMA    EKG EKG Interpretation  Date/Time:  Saturday January 18 2020 08:23:01 EDT Ventricular Rate:  73 PR Interval:    QRS Duration: 137 QT Interval:  425 QTC Calculation: 469 R Axis:   -56 Text Interpretation: Sinus rhythm Nonspecific IVCD with LAD atrifact, poor tracing Confirmed by Lennice Sites [phone]) on 01/18/2020 8:24:59 AM   Radiology CT Angio Chest PE W and/or Wo Contrast  Result Date: 01/18/2020 CLINICAL DATA:  Suspected PE, fever and cough, patient with history of multiple myeloma EXAM: CT ANGIOGRAPHY CHEST WITH CONTRAST TECHNIQUE: Multidetector CT imaging of the chest was performed  using the standard protocol during bolus administration of intravenous contrast. Multiplanar CT image reconstructions and MIPs were obtained to evaluate the vascular anatomy. CONTRAST:  132m OMNIPAQUE IOHEXOL 350 MG/ML SOLN COMPARISON:  June 26, 2017 FINDINGS: Cardiovascular: Heart size is enlarged with signs of aortic valvular calcifications and  mitral annular calcifications. Heart size likely increased from previous exam. Generalized cardiac enlargement. Exam mildly limited by respiratory motion. No evidence of pulmonary embolism. Mediastinum/Nodes: Small, scattered mediastinal lymph nodes. No thoracic inlet adenopathy. No axillary adenopathy. No hilar adenopathy. Esophagus grossly normal. Lungs/Pleura: Nodular opacity at the LEFT lung base measuring approximately 2.3 x 1.9 cm. There is dense consolidation at this site on a previous exam from 2019. No intervening evaluation since that time. Airways are patent. No dense consolidation elsewhere or signs of pleural effusion or ground-glass opacities. Minimal basilar atelectasis. Upper Abdomen: Incidental imaging of upper abdominal contents without acute process. Musculoskeletal: Multifocal lytic changes are present throughout all visualized bones in this patient with reported history of multiple myeloma. Subacute fractures at multiple sites involving RIGHT ribs. For example on image 27 of series 4 a RIGHT second rib fracture with subacute features. Chronic RIGHT fifth rib fracture seen laterally. Multiple sites of subacute fracture along the RIGHT lateral chest wall. Partially lytic process in the inferior RIGHT scapula measures 2.2 x 1.8 cm. Lytic process at the T2 level destroying the posterior vertebral body with soft tissue extension into the central canal, difficult to quantify but seen in the anterior canal is a subtle finding on today's exam. Review of the MIP images confirms the above findings. IMPRESSION: 1. No evidence of pulmonary embolism. 2. Nodular opacity at the LEFT lung base at the site of previous pneumonia. This could represent rounded atelectasis or scarring at the site of previous infection. Current infection is not entirely excluded though there are no areas of ground-glass or additional areas of consolidation. Three-month follow-up may be helpful to assess for any changes. 3. Multifocal  lytic changes throughout all visualized bones in this patient with reported history of multiple myeloma. Most notably at the T2 level there is a lytic process which destroys the posterior margin of the T2 vertebral body with extension of soft tissue into the central canal at this level. Best seen on axial image 15 of series 4. Correlate with any symptoms of worsening back pain or chest wall pain. The most recent comparison evaluation is from 2019. This is a new finding since that time. 4. Subacute fractures at multiple sites involving RIGHT ribs. 5. Cardiomegaly with signs of aortic valvular calcifications and mitral annular calcifications. 6. Aortic atherosclerosis. These results were called by telephone at the time of interpretation on 01/18/2020 at 12:35 pm to provider Kerolos Nehme , who verbally acknowledged these results. Aortic Atherosclerosis (ICD10-I70.0). Electronically Signed   By: GZetta BillsM.D.   On: 01/18/2020 12:30   DG Chest Port 1 View  Result Date: 01/18/2020 CLINICAL DATA:  Productive cough. EXAM: PORTABLE CHEST 1 VIEW COMPARISON:  June 15, 2017 FINDINGS: Cardiomediastinal silhouette is stable. Mild patchy opacities in the bases. Rounded density with high attenuation peripherally and low-attenuation centrally in the left upper lobe. This finding is somewhat obscured by overlapping bones. No other nodules or masses. No pneumothorax. No other interval changes. IMPRESSION: 1. A possible cavitated nodule is not excluded in the left upper lobe. This could be due to confluence of shadows however. Recommend CT imaging for better evaluation. 2. Patchy bibasilar opacities may represent atelectasis or developing infiltrate/pneumonia. 3.  No other change. Electronically Signed   By: Dorise Bullion III M.D   On: 01/18/2020 09:09    Procedures Procedures (including critical care time)  Medications Ordered in ED Medications  vancomycin (VANCOCIN) IVPB 1000 mg/200 mL premix (has no  administration in time range)  0.9 %  sodium chloride infusion ( Intravenous Stopped 01/18/20 1214)  ceFEPIme (MAXIPIME) 2 g in sodium chloride 0.9 % 100 mL IVPB (has no administration in time range)  vancomycin (VANCOREADY) IVPB 2000 mg/400 mL (has no administration in time range)  vancomycin (VANCOCIN) IVPB 1000 mg/200 mL premix ( Intravenous Stopped 01/18/20 1157)  ceFEPIme (MAXIPIME) 2 g in sodium chloride 0.9 % 100 mL IVPB (0 g Intravenous Stopped 01/18/20 0953)  acetaminophen (TYLENOL) tablet 1,000 mg (1,000 mg Oral Given 01/18/20 0833)  sodium chloride 0.9 % bolus 1,000 mL (0 mLs Intravenous Stopped 01/18/20 0953)  iohexol (OMNIPAQUE) 350 MG/ML injection 100 mL (100 mLs Intravenous Contrast Given 01/18/20 1128)    ED Course  I have reviewed the triage vital signs and the nursing notes.  Pertinent labs & imaging results that were available during my care of the patient were reviewed by me and considered in my medical decision making (see chart for details).    MDM Rules/Calculators/A&P                          ANDREI MCCOOK is a 79 year old male with history of multiple myeloma on chemotherapy with last treatment about 10 days ago who presents to the ED with cough, congestion.  Patient with fever of 100.4 upon arrival.  Respiratory rate in the 20s.  However blood pressure, heart rate, oxygenation is unremarkable.  However given recent chemotherapy concern for neutropenic fever and sepsis given fever and tachypnea.  Sepsis order set has been placed and patient given empiric IV antibiotics broad-spectrum.  Will give fluid bolus and Tylenol.  Has had Covid vaccination however will also check for coronavirus.  He mostly has viral type symptoms with cough but it is productive.  Concern for possible pneumonia.  Does not have any abdominal pain.  Will check urine and blood cultures.  Anticipate possible admission given that he is high risk for bacteremia given that he is on active  chemotherapy.  Patient with no significant leukocytosis, anemia, electrolyte abnormality.  Patient is not neutropenic.  Lactic acid is normal.  CHest x-ray was concerning for infection however a possible cavitated nodule is not completely excluded in the left upper lobe and radiology recommended CT scan.  Covid test is negative.  Urinalysis negative for infection.  CT scan showed possible pneumonia in the left lung base however could be atelectasis from previous infection.  Given fever and cough will treat with doxycycline.  Patient does not want any inpatient treatment for IV antibiotics and believe that is reasonable to trial oral antibiotics.  Patient also found to have multiple lytic lesions in his spine.  Radiology called me on the phone and states that T2 shows to be the most impressive of these lesions and appears and may be go into the soft tissue of the central canal at that level.  We do not have any imaging from 2019 and beyond as patient is mostly followed in the [place] system.  Otherwise he has some subacute fractures of the right ribs.  On reevaluation patient with family in the room who states they are well aware of these lesions.  He is not having any upper  back pain or any neurological symptoms to correlate with injury to this area.  I offered to call in their oncologist to discuss these findings and to get them to compare these to MRI images that were recently done however family would just like to be discharged.  Will start patient on doxycycline.  He has follow-up with oncology already this week and will make appointment with primary care doctor to follow-up fever.  Blood cultures have also been drawn.  Overall suspect that the images are likely chronic.  He is not having any worsening pain or neuro symptoms in these areas and believe it is safe for discharge.  Patient is aware of return if he does develop any severe symptoms in the upper back or any other worsening changes including fever,  shortness of breath.  Patient was discharged in ED in good condition.  Understands return precautions.  Patient does not have any signs to suggest sepsis on lab work or vitals at this time. Shared decision for discahrge.  This chart was dictated using voice recognition software.  Despite best efforts to proofread,  errors can occur which can change the documentation meaning.   JEARL SOTO was evaluated in Emergency Department on 01/18/2020 for the symptoms described in the history of present illness. He was evaluated in the context of the global COVID-19 pandemic, which necessitated consideration that the patient might be at risk for infection with the SARS-CoV-2 virus that causes COVID-19. Institutional protocols and algorithms that pertain to the evaluation of patients at risk for COVID-19 are in a state of rapid change based on information released by regulatory bodies including the CDC and federal and state organizations. These policies and algorithms were followed during the patient's care in the ED.  Final Clinical Impression(s) / ED Diagnoses Final diagnoses:  Community acquired pneumonia of left lung, unspecified part of lung    Rx / DC Orders ED Discharge Orders         Ordered    doxycycline (VIBRAMYCIN) 100 MG capsule  2 times daily        01/18/20 1315           [place], DO 01/18/20 1315

## 2020-01-18 NOTE — ED Notes (Signed)
Patient transported to CT 

## 2020-01-18 NOTE — Progress Notes (Signed)
Pharmacy Antibiotic Note  Derek Castro is a 79 y.o. male admitted on 01/18/2020 with pneumonia.  Pharmacy has been consulted for Vanc and Cefepime dosing.  Vancomycin 2000 mg IV Q 24 hrs. Goal AUC 400-550. Expected AUC: 451 SCr used: 1.03   Plan: Cefepime 2 gm IV q8hr Vancomycin 2 gms IV q24hr Monitor renal function, C&S and vanc levels as needed  Height: 5\' 9"  (175.3 cm) Weight: 113.4 kg (250 lb) IBW/kg (Calculated) : 70.7  Temp (24hrs), Avg:100.4 F (38 C), Min:100.4 F (38 C), Max:100.4 F (38 C)  Recent Labs  Lab 01/18/20 0810  WBC 8.1  CREATININE 1.03  LATICACIDVEN 1.3    Estimated Creatinine Clearance: 74.6 mL/min (by C-G formula based on SCr of 1.03 mg/dL).    Allergies  Allergen Reactions  . Tamiflu [Oseltamivir] Nausea And Vomiting    Antimicrobials this admission: Vanc 8/21 >>  Cefep 8/21 >>   Thank you for allowing pharmacy to be a part of this patient's care.  Alanda Slim, PharmD, [facility] Clinical Pharmacist Please see AMION for all Pharmacists' Contact Phone Numbers 01/18/2020, 10:09 AM

## 2020-01-18 NOTE — Progress Notes (Signed)
Code Sepsis monitoring discontinued due to discharge.  

## 2020-01-19 LAB — URINE CULTURE: Culture: NO GROWTH

## 2020-01-23 LAB — CULTURE, BLOOD (ROUTINE X 2)
Culture: NO GROWTH
Culture: NO GROWTH
Special Requests: ADEQUATE
Special Requests: ADEQUATE

## 2020-01-24 ENCOUNTER — Telehealth: Payer: Self-pay

## 2020-01-24 NOTE — Telephone Encounter (Signed)
Please let me know after reviewing schedule for next week, if work in appt possible.

## 2020-01-24 NOTE — Telephone Encounter (Signed)
Patient discharged from hospital 1 week ago for pneumonia.  Discharge instructions state to follow up with PCP.  Dr. Anitra Lauth has no appts next week.  Please review schedule and let me know where we can fit him in.  Patient is not available on Thursday, other doctor appts scheduled on 01/30/20.  Patient can be reached at [phone] [facility])

## 2020-01-28 ENCOUNTER — Encounter: Payer: Self-pay | Admitting: Family Medicine

## 2020-01-28 ENCOUNTER — Other Ambulatory Visit: Payer: Self-pay

## 2020-01-28 ENCOUNTER — Telehealth: Payer: Self-pay

## 2020-01-28 ENCOUNTER — Telehealth (INDEPENDENT_AMBULATORY_CARE_PROVIDER_SITE_OTHER): Payer: Medicare Other | Admitting: Family Medicine

## 2020-01-28 VITALS — BP 120/70 | Wt 250.0 lb

## 2020-01-28 DIAGNOSIS — C7951 Secondary malignant neoplasm of bone: Secondary | ICD-10-CM | POA: Diagnosis not present

## 2020-01-28 DIAGNOSIS — C9002 Multiple myeloma in relapse: Secondary | ICD-10-CM | POA: Diagnosis not present

## 2020-01-28 DIAGNOSIS — Z8551 Personal history of malignant neoplasm of bladder: Secondary | ICD-10-CM | POA: Diagnosis not present

## 2020-01-28 DIAGNOSIS — R05 Cough: Secondary | ICD-10-CM

## 2020-01-28 DIAGNOSIS — M25512 Pain in left shoulder: Secondary | ICD-10-CM | POA: Diagnosis not present

## 2020-01-28 DIAGNOSIS — Z8579 Personal history of other malignant neoplasms of lymphoid, hematopoietic and related tissues: Secondary | ICD-10-CM | POA: Diagnosis not present

## 2020-01-28 DIAGNOSIS — C674 Malignant neoplasm of posterior wall of bladder: Secondary | ICD-10-CM | POA: Diagnosis not present

## 2020-01-28 DIAGNOSIS — Z9484 Stem cells transplant status: Secondary | ICD-10-CM | POA: Diagnosis not present

## 2020-01-28 DIAGNOSIS — J189 Pneumonia, unspecified organism: Secondary | ICD-10-CM

## 2020-01-28 DIAGNOSIS — M79601 Pain in right arm: Secondary | ICD-10-CM | POA: Diagnosis not present

## 2020-01-28 DIAGNOSIS — R509 Fever, unspecified: Secondary | ICD-10-CM

## 2020-01-28 DIAGNOSIS — J069 Acute upper respiratory infection, unspecified: Secondary | ICD-10-CM | POA: Diagnosis not present

## 2020-01-28 DIAGNOSIS — R202 Paresthesia of skin: Secondary | ICD-10-CM | POA: Diagnosis not present

## 2020-01-28 DIAGNOSIS — R058 Other specified cough: Secondary | ICD-10-CM

## 2020-01-28 DIAGNOSIS — Z8546 Personal history of malignant neoplasm of prostate: Secondary | ICD-10-CM | POA: Diagnosis not present

## 2020-01-28 DIAGNOSIS — M79602 Pain in left arm: Secondary | ICD-10-CM | POA: Diagnosis not present

## 2020-01-28 DIAGNOSIS — M542 Cervicalgia: Secondary | ICD-10-CM | POA: Diagnosis not present

## 2020-01-28 DIAGNOSIS — M25511 Pain in right shoulder: Secondary | ICD-10-CM | POA: Diagnosis not present

## 2020-01-28 DIAGNOSIS — I517 Cardiomegaly: Secondary | ICD-10-CM | POA: Diagnosis not present

## 2020-01-28 MED ORDER — CEFDINIR 300 MG PO CAPS: 300.0000 mg | ORAL_CAPSULE | Freq: Two times a day (BID) | ORAL | 0 refills | Status: DC

## 2020-01-28 NOTE — Progress Notes (Signed)
Virtual Visit via Video Note  I connected with pt on 01/28/20 at  8:30 AM EDT by telephone b/c a video enabled telemedicine application would not work--pt with technical difficulties-- and verified that I am speaking with the correct person using two identifiers.  Location patient: home Location provider:work or home office Persons participating in the virtual visit: patient, provider  I discussed the limitations of evaluation and management by telemedicine and the availability of in person appointments. The patient expressed understanding and agreed to proceed.  Telemedicine visit is a necessity given the COVID-19 restrictions in place at the current time.  HPI: [name] with whom I am doing a telephone visit today (due to COVID-19 pandemic restrictions) for chills, cough, achiness.  This is a 10d f/u from an ED visit. He has had covid 19 vaccine.  HPI: he went to ED 01/18/20 ([facility]) for productive cough of 2d duration in the context of URI sx's.  Temp at ED 100.4, mild inc RR but no resp distress or hypoxia.  Due to risk for sepsis he was given empiric IV abx, fluid bolus  Labs unremarkable but chest xray concerning for infection however a possible cavitated nodule is not completely excluded in LU lob and radiology recommended CT chest.  This showed no abnormality at site of cavitary lesion in LUL on cxr.  Showed possible bilat L lung base atelectasis vs pneumonia. Covid test neg.  UA neg.  Blood cultures were drawn. CT showed possible L lung base pneumonia vs atelectasis.  Pt was d/c'd to home on doxycycline Most recent chemo for his mult myel was 01/09/20.   INTERIM HX: Productive cough, thick and yellow is the main issue, causing lots of shoulder aching.   Took last abx yesterday.  Says breathing is "just fine".  Eating and drinking fine. NO signif prob with diarrhea at this time.  ST initially but this is gone now.  Lots of nasal congestion/PND.  No HA. Last couple days has felt  chills but no temp monitoring. Taking delsym and mucinex.  NOt helping much w/cough.  Sleep is fair.   No known recent sick contacts.  ROS: no fevers, no CP, no SOB, no dizziness, no HAs, no rashes, no melena/hematochezia.  No polyuria or polydipsia.  No myalgias or arthralgias.  No focal weakness, paresthesias, or tremors.  No acute vision or hearing abnormalities. No n/v/d or abd pain.  No palpitations.     Past Medical History:  Diagnosis Date  . Alcoholism in remission ([place])    [facility] three times per week as of 01/2017.  . Bladder cancer ([place]) 07/2019   07/10/2019 TURBT/gemcitabine instillation - LG, Ta, no muscle present  . Bone lesion   . Depression   . Fall 01/06/2018   pt fell wearing crocs while going up muddy embankment and hit head,no LOC went to ER no fractures pt sent home  . Family history of colon cancer in mother   . 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 Hyperlipidemia    Pt declined statin 08/2017  . Hypertension   . Insomnia    uses quetiapine for this  . Multiple myeloma ([place])    [facility]  . Osteoarthritis    C-spine--hx of fusion surgery.  R foot. Knees (hx of L TKA)  . Prediabetes   . Prostate cancer [facility])    [facility]    Past Surgical  History:  Procedure Laterality Date  . ANTERIOR FUSION CERVICAL SPINE  2002  . autologous stem cell transplant  2010 and 2012   [facility] in [place].  Marland Kitchen COLONOSCOPY  2015   Recall 5 yrs ([facility] in [place], [place])  . CYSTOSCOPY  07/05/2019   for gross hematuria; noninvasive-appearing tumor found on cystoscopy->plan for TURBT.  Marland Kitchen EXCISIONAL TOTAL KNEE ARTHROPLASTY  2014 and 2016   Both knees  . LAPAROSCOPIC RETROPUBIC PROSTATECTOMY     + hx of radiation to pelvis.  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  Procedure: LEFT EXCISION MASS;  Surgeon: Leta Baptist, MD;   Location: [place];  Service: ENT;  Laterality: Left;  . SKIN FULL THICKNESS GRAFT Left 01/31/2018   Procedure: SKIN GRAFT FULL THICKNESS;  Surgeon: Leta Baptist, MD;  Location: [place];  Service: ENT;  Laterality: Left;  . TRANSURETHRAL RESECTION OF BLADDER TUMOR WITH GYRUS (TURBT-GYRUS)  07/10/2019   07/10/2019 TURBT/gemcitabine instillation - LG, Ta, no muscle present    Family History  Problem Relation Age of Onset  . Cancer Mother        colon   SH: alcoholism, longtime marital discord causing chronic psychosocial discord.   Current Outpatient Medications:  .  acyclovir (ZOVIRAX) 400 MG tablet, TAKE ONE TABLET BY MOUTH DAILY, Disp: 30 tablet, Rfl: 6 .  amLODipine (NORVASC) 10 MG tablet, TAKE ONE TABLET BY MOUTH EVERY DAY, Disp: 90 tablet, Rfl: 0 .  desonide (DESOWEN) 0.05 % ointment, Apply 1 application topically as needed. , Disp: , Rfl:  .  lisinopril (ZESTRIL) 30 MG tablet, Take 1 tablet (30 mg total) by mouth daily., Disp: 90 tablet, Rfl: 3 .  QUEtiapine (SEROQUEL) 100 MG tablet, TAKE ONE TABLET BY MOUTH AT BEDTIME, Disp: 90 tablet, Rfl: 3 .  zolendronic acid (ZOMETA) 4 MG/5ML injection, Inject into the vein., Disp: , Rfl:  .  cefdinir (OMNICEF) 300 MG capsule, Take 1 capsule (300 mg total) by mouth 2 (two) times daily., Disp: 20 capsule, Rfl: 0 .  ibuprofen (ADVIL) 800 MG tablet, Take 800 mg by mouth every 8 (eight) hours as needed. (Patient not taking: Reported on 12/27/2019), Disp: , Rfl:  .  ondansetron (ZOFRAN) 8 MG tablet, TAKE 1 TABLET (8 MG TOTAL) BY MOUTH EVERY 8 (EIGHT) HOURS AS NEEDED FOR NAUSEA FOR UP TO 30 DOSES. (Patient not taking: Reported on 01/28/2020), Disp: , Rfl: 3 .  zolpidem (AMBIEN) 10 MG tablet, Take 10 mg by mouth at bedtime as needed. (Patient not taking: Reported on 01/28/2020), Disp: , Rfl:   EXAM:  VITALS per patient if applicable:  Vitals with BMI 01/28/2020 01/18/2020 01/18/2020  Height - - -  Weight 250 lbs - -  BMI 40.3  - -  Systolic 474 259 563  Diastolic 70 94 76  Pulse - 64 66     GENERAL: alert, oriented, sounds well and in no acute distress No further exam b/c audio visit only.   LABS: none today    Chemistry      Component Value Date/Time   NA 138 01/18/2020 0810   NA 135 (A) 06/12/2019 0000   K 4.8 01/18/2020 0810   CL 102 01/18/2020 0810   CO2 27 01/18/2020 0810   BUN 14 01/18/2020 0810   BUN 27 (A) 06/12/2019 0000   CREATININE 1.03 01/18/2020 0810   GLU 93 06/12/2019 0000      Component Value Date/Time  CALCIUM 9.0 01/18/2020 0810   ALKPHOS 69 01/18/2020 0810   AST 23 01/18/2020 0810   ALT 17 01/18/2020 0810   BILITOT 0.7 01/18/2020 0810     Lab Results  Component Value Date   WBC 8.1 01/18/2020   HGB 11.5 (L) 01/18/2020   HCT 35.9 (L) 01/18/2020   MCV 93.5 01/18/2020   PLT 193 01/18/2020    ASSESSMENT AND PLAN:  Discussed the following assessment and plan:  1) Pneumonia: ongoing "chills", not exactly convincing for rigors.  Productive cough not really changed.  NO signs of resp distress at all. Given his high risk (partially immunocompromised) I'll give a 10d course of omnicef 300 mg bid. Recommended mucinex DM q12h.  No hydrocodone cough syrup b/c of his hx of alcoholism. Need to address in future--> he has no documented hx of pneumovax or prevoar.   3 mo CT chest to monitor L lung base nodule?  -we discussed possible serious and likely etiologies, options for evaluation and workup, limitations of telemedicine visit vs in person visit, treatment, treatment risks and precautions. Pt prefers to treat via telemedicine empirically rather then risking or undertaking an in person visit at this moment.  Work/School slipped offered: Advised to seek prompt follow up telemedicine visit or in person care if worsening, new symptoms arise, or if is not improving with treatment. Did let her know that I only do telemedicine on Tuesdays and Thursdays for [place] and advised follow  up visit with PCP or [facility] if needs follow up or if any further questions arise to avoid any delays.   I discussed the assessment and treatment plan with the patient. The patient was provided an opportunity to ask questions and all were answered. The patient agreed with the plan and demonstrated an understanding of the instructions.   The patient was advised to call back or seek an in-person evaluation if the symptoms worsen or if the condition fails to improve as anticipated.  F/u: 10d virual, f/u pneumonia  Signed:  Crissie Sickles, MD           01/28/2020

## 2020-01-28 NOTE — Telephone Encounter (Signed)
LM for pt to returncall

## 2020-01-28 NOTE — Telephone Encounter (Signed)
Patient wife Derek Castro [facility]) calling about husband.  They (patient and wife) thinks that new medication is causing patient to have pain in both arms to where he is unable to lift them.  Please advise.  Please call Derek Castro at [phone]

## 2020-01-29 DIAGNOSIS — R202 Paresthesia of skin: Secondary | ICD-10-CM | POA: Diagnosis not present

## 2020-01-29 DIAGNOSIS — M79601 Pain in right arm: Secondary | ICD-10-CM | POA: Diagnosis not present

## 2020-01-29 DIAGNOSIS — M25511 Pain in right shoulder: Secondary | ICD-10-CM | POA: Diagnosis not present

## 2020-01-29 DIAGNOSIS — Z8546 Personal history of malignant neoplasm of prostate: Secondary | ICD-10-CM | POA: Diagnosis not present

## 2020-01-29 DIAGNOSIS — I10 Essential (primary) hypertension: Secondary | ICD-10-CM | POA: Diagnosis present

## 2020-01-29 DIAGNOSIS — R509 Fever, unspecified: Secondary | ICD-10-CM | POA: Insufficient documentation

## 2020-01-29 DIAGNOSIS — Z8551 Personal history of malignant neoplasm of bladder: Secondary | ICD-10-CM | POA: Insufficient documentation

## 2020-01-29 DIAGNOSIS — N400 Enlarged prostate without lower urinary tract symptoms: Secondary | ICD-10-CM | POA: Diagnosis present

## 2020-01-29 DIAGNOSIS — C674 Malignant neoplasm of posterior wall of bladder: Secondary | ICD-10-CM | POA: Diagnosis not present

## 2020-01-29 DIAGNOSIS — J189 Pneumonia, unspecified organism: Secondary | ICD-10-CM | POA: Insufficient documentation

## 2020-01-29 DIAGNOSIS — Z9484 Stem cells transplant status: Secondary | ICD-10-CM | POA: Diagnosis not present

## 2020-01-29 DIAGNOSIS — C7951 Secondary malignant neoplasm of bone: Secondary | ICD-10-CM | POA: Diagnosis not present

## 2020-01-29 DIAGNOSIS — M50222 Other cervical disc displacement at C5-C6 level: Secondary | ICD-10-CM | POA: Diagnosis not present

## 2020-01-29 DIAGNOSIS — M542 Cervicalgia: Secondary | ICD-10-CM | POA: Diagnosis not present

## 2020-01-29 DIAGNOSIS — C9002 Multiple myeloma in relapse: Secondary | ICD-10-CM | POA: Diagnosis present

## 2020-01-29 DIAGNOSIS — M19079 Primary osteoarthritis, unspecified ankle and foot: Secondary | ICD-10-CM | POA: Diagnosis present

## 2020-01-29 DIAGNOSIS — Z981 Arthrodesis status: Secondary | ICD-10-CM | POA: Diagnosis not present

## 2020-01-29 DIAGNOSIS — M4803 Spinal stenosis, cervicothoracic region: Secondary | ICD-10-CM | POA: Diagnosis present

## 2020-01-29 DIAGNOSIS — Z20822 Contact with and (suspected) exposure to covid-19: Secondary | ICD-10-CM | POA: Diagnosis present

## 2020-01-29 DIAGNOSIS — I517 Cardiomegaly: Secondary | ICD-10-CM | POA: Diagnosis not present

## 2020-01-29 DIAGNOSIS — M25512 Pain in left shoulder: Secondary | ICD-10-CM | POA: Diagnosis not present

## 2020-01-29 DIAGNOSIS — M4802 Spinal stenosis, cervical region: Secondary | ICD-10-CM | POA: Diagnosis not present

## 2020-01-29 DIAGNOSIS — Z8579 Personal history of other malignant neoplasms of lymphoid, hematopoietic and related tissues: Secondary | ICD-10-CM | POA: Diagnosis not present

## 2020-01-29 DIAGNOSIS — M79602 Pain in left arm: Secondary | ICD-10-CM | POA: Diagnosis not present

## 2020-01-29 DIAGNOSIS — Z87891 Personal history of nicotine dependence: Secondary | ICD-10-CM | POA: Diagnosis not present

## 2020-01-29 DIAGNOSIS — Z923 Personal history of irradiation: Secondary | ICD-10-CM | POA: Diagnosis not present

## 2020-01-29 NOTE — Telephone Encounter (Signed)
Spoke with Lucita Ferrara, pt's wife and she provided update regarding patient's current status. He was taken to ED for further evaluation and still currently there

## 2020-01-29 NOTE — Telephone Encounter (Signed)
Noted  

## 2020-01-31 NOTE — Telephone Encounter (Signed)
Next Wednesday at 4, in person.

## 2020-01-31 NOTE — Telephone Encounter (Signed)
Patient was seen on 8/31 and recommended f/u in 10 days

## 2020-02-06 DIAGNOSIS — Z5112 Encounter for antineoplastic immunotherapy: Secondary | ICD-10-CM | POA: Diagnosis not present

## 2020-02-06 DIAGNOSIS — C9000 Multiple myeloma not having achieved remission: Secondary | ICD-10-CM | POA: Diagnosis not present

## 2020-02-06 DIAGNOSIS — R454 Irritability and anger: Secondary | ICD-10-CM | POA: Diagnosis not present

## 2020-02-06 DIAGNOSIS — C674 Malignant neoplasm of posterior wall of bladder: Secondary | ICD-10-CM | POA: Diagnosis not present

## 2020-02-06 DIAGNOSIS — C7951 Secondary malignant neoplasm of bone: Secondary | ICD-10-CM | POA: Diagnosis not present

## 2020-02-06 DIAGNOSIS — C7931 Secondary malignant neoplasm of brain: Secondary | ICD-10-CM | POA: Diagnosis not present

## 2020-02-06 DIAGNOSIS — F4320 Adjustment disorder, unspecified: Secondary | ICD-10-CM | POA: Diagnosis not present

## 2020-02-06 DIAGNOSIS — Z8546 Personal history of malignant neoplasm of prostate: Secondary | ICD-10-CM | POA: Diagnosis not present

## 2020-02-06 DIAGNOSIS — Z5111 Encounter for antineoplastic chemotherapy: Secondary | ICD-10-CM | POA: Diagnosis not present

## 2020-02-06 DIAGNOSIS — R29898 Other symptoms and signs involving the musculoskeletal system: Secondary | ICD-10-CM | POA: Diagnosis not present

## 2020-02-12 DIAGNOSIS — D225 Melanocytic nevi of trunk: Secondary | ICD-10-CM | POA: Diagnosis not present

## 2020-02-12 DIAGNOSIS — L814 Other melanin hyperpigmentation: Secondary | ICD-10-CM | POA: Diagnosis not present

## 2020-02-12 DIAGNOSIS — L820 Inflamed seborrheic keratosis: Secondary | ICD-10-CM | POA: Diagnosis not present

## 2020-02-12 DIAGNOSIS — D2270 Melanocytic nevi of unspecified lower limb, including hip: Secondary | ICD-10-CM | POA: Diagnosis not present

## 2020-02-12 DIAGNOSIS — T148XXA Other injury of unspecified body region, initial encounter: Secondary | ICD-10-CM | POA: Diagnosis not present

## 2020-02-12 DIAGNOSIS — D2260 Melanocytic nevi of unspecified upper limb, including shoulder: Secondary | ICD-10-CM | POA: Diagnosis not present

## 2020-02-12 DIAGNOSIS — L308 Other specified dermatitis: Secondary | ICD-10-CM | POA: Diagnosis not present

## 2020-02-12 DIAGNOSIS — L821 Other seborrheic keratosis: Secondary | ICD-10-CM | POA: Diagnosis not present

## 2020-02-12 DIAGNOSIS — L738 Other specified follicular disorders: Secondary | ICD-10-CM | POA: Diagnosis not present

## 2020-02-12 DIAGNOSIS — Z85828 Personal history of other malignant neoplasm of skin: Secondary | ICD-10-CM | POA: Diagnosis not present

## 2020-02-12 DIAGNOSIS — I878 Other specified disorders of veins: Secondary | ICD-10-CM | POA: Diagnosis not present

## 2020-02-12 DIAGNOSIS — Z5111 Encounter for antineoplastic chemotherapy: Secondary | ICD-10-CM | POA: Diagnosis not present

## 2020-02-12 DIAGNOSIS — C9000 Multiple myeloma not having achieved remission: Secondary | ICD-10-CM | POA: Diagnosis not present

## 2020-02-17 DIAGNOSIS — C9000 Multiple myeloma not having achieved remission: Secondary | ICD-10-CM | POA: Diagnosis not present

## 2020-02-21 DIAGNOSIS — R29898 Other symptoms and signs involving the musculoskeletal system: Secondary | ICD-10-CM | POA: Diagnosis not present

## 2020-02-21 DIAGNOSIS — C9000 Multiple myeloma not having achieved remission: Secondary | ICD-10-CM | POA: Diagnosis not present

## 2020-03-02 DIAGNOSIS — M47812 Spondylosis without myelopathy or radiculopathy, cervical region: Secondary | ICD-10-CM | POA: Diagnosis not present

## 2020-03-02 DIAGNOSIS — C9000 Multiple myeloma not having achieved remission: Secondary | ICD-10-CM | POA: Diagnosis not present

## 2020-03-02 DIAGNOSIS — Z981 Arthrodesis status: Secondary | ICD-10-CM | POA: Diagnosis not present

## 2020-03-03 DIAGNOSIS — F32A Depression, unspecified: Secondary | ICD-10-CM | POA: Insufficient documentation

## 2020-03-03 DIAGNOSIS — Z6839 Body mass index (BMI) 39.0-39.9, adult: Secondary | ICD-10-CM | POA: Diagnosis not present

## 2020-03-03 DIAGNOSIS — C9000 Multiple myeloma not having achieved remission: Secondary | ICD-10-CM | POA: Diagnosis not present

## 2020-03-05 DIAGNOSIS — M79602 Pain in left arm: Secondary | ICD-10-CM | POA: Diagnosis not present

## 2020-03-05 DIAGNOSIS — I10 Essential (primary) hypertension: Secondary | ICD-10-CM | POA: Diagnosis not present

## 2020-03-05 DIAGNOSIS — Z8551 Personal history of malignant neoplasm of bladder: Secondary | ICD-10-CM | POA: Diagnosis not present

## 2020-03-05 DIAGNOSIS — Z8546 Personal history of malignant neoplasm of prostate: Secondary | ICD-10-CM | POA: Diagnosis not present

## 2020-03-05 DIAGNOSIS — C9000 Multiple myeloma not having achieved remission: Secondary | ICD-10-CM | POA: Diagnosis not present

## 2020-03-05 DIAGNOSIS — M79601 Pain in right arm: Secondary | ICD-10-CM | POA: Diagnosis not present

## 2020-03-13 DIAGNOSIS — I517 Cardiomegaly: Secondary | ICD-10-CM | POA: Diagnosis not present

## 2020-03-13 DIAGNOSIS — C7951 Secondary malignant neoplasm of bone: Secondary | ICD-10-CM | POA: Diagnosis not present

## 2020-03-13 DIAGNOSIS — C9000 Multiple myeloma not having achieved remission: Secondary | ICD-10-CM | POA: Diagnosis not present

## 2020-03-19 DIAGNOSIS — M48061 Spinal stenosis, lumbar region without neurogenic claudication: Secondary | ICD-10-CM | POA: Diagnosis not present

## 2020-03-19 DIAGNOSIS — Z5111 Encounter for antineoplastic chemotherapy: Secondary | ICD-10-CM | POA: Diagnosis not present

## 2020-03-19 DIAGNOSIS — M4802 Spinal stenosis, cervical region: Secondary | ICD-10-CM | POA: Diagnosis not present

## 2020-03-19 DIAGNOSIS — M2011 Hallux valgus (acquired), right foot: Secondary | ICD-10-CM | POA: Diagnosis not present

## 2020-03-19 DIAGNOSIS — Z8546 Personal history of malignant neoplasm of prostate: Secondary | ICD-10-CM | POA: Diagnosis not present

## 2020-03-19 DIAGNOSIS — C674 Malignant neoplasm of posterior wall of bladder: Secondary | ICD-10-CM | POA: Diagnosis not present

## 2020-03-19 DIAGNOSIS — C9000 Multiple myeloma not having achieved remission: Secondary | ICD-10-CM | POA: Diagnosis not present

## 2020-03-19 DIAGNOSIS — Z923 Personal history of irradiation: Secondary | ICD-10-CM | POA: Diagnosis not present

## 2020-03-19 DIAGNOSIS — C7951 Secondary malignant neoplasm of bone: Secondary | ICD-10-CM | POA: Diagnosis not present

## 2020-03-19 DIAGNOSIS — R454 Irritability and anger: Secondary | ICD-10-CM | POA: Diagnosis not present

## 2020-03-19 DIAGNOSIS — Z872 Personal history of diseases of the skin and subcutaneous tissue: Secondary | ICD-10-CM | POA: Diagnosis not present

## 2020-03-26 DIAGNOSIS — Z5111 Encounter for antineoplastic chemotherapy: Secondary | ICD-10-CM | POA: Diagnosis not present

## 2020-03-26 DIAGNOSIS — C9000 Multiple myeloma not having achieved remission: Secondary | ICD-10-CM | POA: Diagnosis not present

## 2020-03-26 DIAGNOSIS — Z79899 Other long term (current) drug therapy: Secondary | ICD-10-CM | POA: Diagnosis not present

## 2020-04-02 DIAGNOSIS — R454 Irritability and anger: Secondary | ICD-10-CM | POA: Diagnosis not present

## 2020-04-02 DIAGNOSIS — Z5111 Encounter for antineoplastic chemotherapy: Secondary | ICD-10-CM | POA: Diagnosis not present

## 2020-04-02 DIAGNOSIS — Z79899 Other long term (current) drug therapy: Secondary | ICD-10-CM | POA: Diagnosis not present

## 2020-04-02 DIAGNOSIS — F4320 Adjustment disorder, unspecified: Secondary | ICD-10-CM | POA: Diagnosis not present

## 2020-04-02 DIAGNOSIS — Z87891 Personal history of nicotine dependence: Secondary | ICD-10-CM | POA: Diagnosis not present

## 2020-04-02 DIAGNOSIS — Z923 Personal history of irradiation: Secondary | ICD-10-CM | POA: Diagnosis not present

## 2020-04-02 DIAGNOSIS — C9000 Multiple myeloma not having achieved remission: Secondary | ICD-10-CM | POA: Diagnosis not present

## 2020-04-06 ENCOUNTER — Other Ambulatory Visit: Payer: Self-pay | Admitting: Family Medicine

## 2020-04-06 ENCOUNTER — Ambulatory Visit
Admission: RE | Admit: 2020-04-06 | Discharge: 2020-04-06 | Disposition: A | Discharge status: Home / Self Care | Payer: Self-pay | Source: Ambulatory Visit | Attending: Radiation Oncology | Admitting: Radiation Oncology

## 2020-04-06 ENCOUNTER — Other Ambulatory Visit: Payer: Self-pay | Admitting: Radiation Oncology

## 2020-04-06 DIAGNOSIS — C9000 Multiple myeloma not having achieved remission: Secondary | ICD-10-CM

## 2020-04-13 NOTE — Progress Notes (Signed)
Histology and Location of Primary Cancer: Multiple Myeloma- bone mets to bilateral clavicles, bilateral scapula, right humeral head.  Patient presents with bilateral clavicle, bilateral shoulders, arm and neck pain.  PET 03/13/2020: Disease in the right humeral head, bilateral clavicular heads, and bilateral scapula.  MRI  C Spine 01/29/2020: Interval progression of his diffuse osseous disease throughout the spine.   Past/Anticipated chemotherapy by radiation oncology, if any:  Dr. Kelsey/Dr. Roxan Hockey 04/02/2020- [facility]  -He notes neck pain.  Recent PET showed no FDG avid lesions in the cervical spine and his MRI was stable.  Therefore, I don't think RT to this site is needed. -We reccommed palliative RT to the PET avid disease in the bilateral clavicles and right humerus (including adjacent disease in the scapulae that are also FDG-avid bone destruction) which are likely contributing to his most intense pain.  Pain on a scale of 0-10 is: 4/10 pain in his back and neck.  Takes Advil   Ambulatory status? Walker? Wheelchair?: Ambulatory  SAFETY ISSUES:  Prior radiation? C2 treated in [place] in 2003.  2014 T5, T12-L1. Prostate RT 2015  Pacemaker/ICD? No  Possible current pregnancy? n/a Is the patient on methotrexate? No  Current Complaints / other details:   History -Plasmacytoma involving C2- RT 2003, [place] -Multiple Myeloma 2005- multiple ASCT in 2006 and 2007. -Recurrence 2014 T & L Spine- RT (T5, T12-L1) followed by Velcade/revlimid until 2017. -Progressive disease throughout the thoracic ad lumbar spine. -Prostate Cancer RT in 2015 -Bladder Cancer TURBT followed by Gemcitabine in 2021.

## 2020-04-14 ENCOUNTER — Ambulatory Visit
Admission: RE | Admit: 2020-04-14 | Discharge: 2020-04-14 | Disposition: A | Discharge status: Home / Self Care | Payer: Medicare Other | Source: Ambulatory Visit | Attending: Radiation Oncology | Admitting: Radiation Oncology

## 2020-04-14 ENCOUNTER — Other Ambulatory Visit: Payer: Self-pay | Admitting: Radiation Oncology

## 2020-04-14 ENCOUNTER — Encounter: Payer: Self-pay | Admitting: Radiation Oncology

## 2020-04-14 VITALS — BP 141/59 | HR 67 | Temp 97.6°F | Resp 18 | Ht 69.0 in | Wt 264.0 lb

## 2020-04-14 DIAGNOSIS — F1011 Alcohol abuse, in remission: Secondary | ICD-10-CM | POA: Diagnosis not present

## 2020-04-14 DIAGNOSIS — C9002 Multiple myeloma in relapse: Secondary | ICD-10-CM

## 2020-04-14 DIAGNOSIS — G4700 Insomnia, unspecified: Secondary | ICD-10-CM | POA: Diagnosis not present

## 2020-04-14 DIAGNOSIS — C61 Malignant neoplasm of prostate: Secondary | ICD-10-CM | POA: Insufficient documentation

## 2020-04-14 DIAGNOSIS — Z79899 Other long term (current) drug therapy: Secondary | ICD-10-CM | POA: Diagnosis not present

## 2020-04-14 DIAGNOSIS — C9000 Multiple myeloma not having achieved remission: Secondary | ICD-10-CM | POA: Insufficient documentation

## 2020-04-14 DIAGNOSIS — C7951 Secondary malignant neoplasm of bone: Secondary | ICD-10-CM

## 2020-04-14 DIAGNOSIS — F329 Major depressive disorder, single episode, unspecified: Secondary | ICD-10-CM | POA: Diagnosis not present

## 2020-04-14 DIAGNOSIS — E785 Hyperlipidemia, unspecified: Secondary | ICD-10-CM | POA: Diagnosis not present

## 2020-04-14 DIAGNOSIS — Z8546 Personal history of malignant neoplasm of prostate: Secondary | ICD-10-CM | POA: Diagnosis not present

## 2020-04-14 DIAGNOSIS — Z87891 Personal history of nicotine dependence: Secondary | ICD-10-CM | POA: Diagnosis not present

## 2020-04-14 DIAGNOSIS — Z8551 Personal history of malignant neoplasm of bladder: Secondary | ICD-10-CM | POA: Insufficient documentation

## 2020-04-14 DIAGNOSIS — Z923 Personal history of irradiation: Secondary | ICD-10-CM | POA: Diagnosis not present

## 2020-04-14 DIAGNOSIS — Z800 Family history of malignant neoplasm of digestive organs: Secondary | ICD-10-CM | POA: Diagnosis not present

## 2020-04-14 DIAGNOSIS — I10 Essential (primary) hypertension: Secondary | ICD-10-CM | POA: Insufficient documentation

## 2020-04-14 DIAGNOSIS — Z85830 Personal history of malignant neoplasm of bone: Secondary | ICD-10-CM | POA: Diagnosis not present

## 2020-04-14 DIAGNOSIS — M1990 Unspecified osteoarthritis, unspecified site: Secondary | ICD-10-CM | POA: Insufficient documentation

## 2020-04-14 NOTE — Progress Notes (Signed)
Radiation Oncology         [phone] ________________________________  Outpatient Consultation   Name: Derek Castro        MRN: [id number]  Date of Service: 04/14/2020 DOB: 06-05-1940  CC:McGowen, Adrian Blackwater, MD  Derek Castro*     REFERRING PHYSICIAN: Mason Jim Castro*   DIAGNOSIS: The primary encounter diagnosis was Secondary malignant neoplasm of bone ([place]). A diagnosis of Multiple myeloma in relapse [facility]) was also pertinent to this visit.   HISTORY OF PRESENT ILLNESS: Derek Castro is a 79 y.o. male seen at the request of Dr. Merleen Nicely in radiation oncology at [facility] for consideration of palliative radiotherapy for multipe sites for the patient's history of multiple myeloma not having achieved remission. The patient was originally diagnosed in 2003 and had been living in [place] at that time. 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 He was diagnosed with Prostate cancer in 2013 and underwent radical prostatectomy in [place] for a 4+3 prostate adenocarcinoma that had a focally positive margin in the right posterior aspect of the resection. His 3 nodes were negative, and he was found to have chemical recurrence in 2017 and was treated with lupron and prostatic fossa IMRT with Dr. Tammi Klippel. His prostate cancer remains in remission, and his multiple myeloma has been on maintenance Velcade until he had progression in September 2020 throughout the thoracic and lumbar spine. He has been on several other systemic therapies, and recent MRI spine on 01/29/20 at [facility] where he is followed showed progressive disease that was confirmed by PET imaging on 03/13/20 also showing disease in the right humeral head, bilateral clavicular heads, and bilateral scapula. Apparently his pain is also in the neck but there was not a PET correlate and  his C spine MRI was felt to be stable. He has had progressive pain in these newly identified sites as well and was started on a new systemic therapy with oral Cytoxan and Carfilzomib, but saw Dr. Merleen Nicely in radiation to consider palliative radiotherapy to the bilateral scapulae, and clavicular heads as well as his right humeral head. He's due for a second cycle of his systemic therapy this Friday and is seen today to discuss next steps of palliative radiotherapy.    PREVIOUS RADIATION THERAPY: Yes   04/17/16-06/02/16:  The prostatic fossa was treated to 68.4 Gy in 38 fractions by Dr. Tammi Klippel.  2014: The patient's spine at levels T5, T12-L1 was treated with palliative radiotherapy with Dr. Tammi Klippel  2003: The patient's spine at C2 was treated with palliative radiotherapy in [place], [place] HISTORY:  Past Medical History:  Diagnosis Date  . Alcoholism in remission ([place])    [facility] three times per week as of 01/2017.  . Bladder cancer ([place]) 07/2019   07/10/2019 TURBT/gemcitabine instillation - LG, Ta, no muscle present  . Bone lesion   . Depression   . Fall 01/06/2018   pt fell wearing crocs while going up muddy embankment and hit head,no LOC went to ER no fractures pt sent home  . Family history of colon cancer in mother   . 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 Hyperlipidemia    Pt declined statin 08/2017  . Hypertension   . Insomnia    uses quetiapine for this  .  Multiple myeloma ([place])    [facility]  . Osteoarthritis    C-spine--hx of fusion surgery.  R foot. Knees (hx of L TKA)  . Prediabetes   . Prostate cancer ([place])    [facility]       PAST SURGICAL HISTORY: Past Surgical History:  Procedure Laterality Date  . ANTERIOR FUSION CERVICAL SPINE  2002  . autologous stem cell transplant  2010 and 2012   [facility] in [place].  Marland Kitchen COLONOSCOPY  2015   Recall 5  yrs ([facility] in [place], [place])  . CYSTOSCOPY  07/05/2019   for gross hematuria; noninvasive-appearing tumor found on cystoscopy->plan for TURBT.  Marland Kitchen EXCISIONAL TOTAL KNEE ARTHROPLASTY  2014 and 2016   Both knees  . LAPAROSCOPIC RETROPUBIC PROSTATECTOMY     + hx of radiation to pelvis.  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  Procedure: LEFT EXCISION MASS;  Surgeon: Leta Baptist, MD;  Location: [place];  Service: ENT;  Laterality: Left;  . SKIN FULL THICKNESS GRAFT Left 01/31/2018   Procedure: SKIN GRAFT FULL THICKNESS;  Surgeon: Leta Baptist, MD;  Location: [place];  Service: ENT;  Laterality: Left;  . TRANSURETHRAL RESECTION OF BLADDER TUMOR WITH GYRUS (TURBT-GYRUS)  07/10/2019   07/10/2019 TURBT/gemcitabine instillation - LG, Ta, no muscle present     FAMILY HISTORY:  Family History  Problem Relation Age of Onset  . Cancer Mother        colon     SOCIAL HISTORY:  reports that he quit smoking about 24 years ago. His smoking use included cigarettes. He has a 5.00 pack-year smoking history. He has never used smokeless tobacco. He reports that he does not drink alcohol and does not use drugs. The patient is  Married and lives in [place]. He is retired from working in the [employer]. He has two adult children, his daughter is an NP.  ALLERGIES: Tamiflu [oseltamivir]   MEDICATIONS:  Current Outpatient Medications  Medication Sig Dispense Refill  . acyclovir (ZOVIRAX) 400 MG tablet TAKE ONE TABLET BY MOUTH DAILY 30 tablet 6  . amLODipine (NORVASC) 10 MG tablet TAKE ONE TABLET BY MOUTH EVERY DAY 90 tablet 0  . cefdinir (OMNICEF) 300 MG capsule Take 1 capsule (300 mg total) by mouth 2 (two) times daily. 20 capsule 0  . desonide (DESOWEN) 0.05 % ointment Apply 1 application topically as needed.     Marland Kitchen dexamethasone (DECADRON) 4 MG tablet Take 42m (2 x 45mtablets) by mouth once on day 22 of treatment  cycles 1-9.    . lisinopril (ZESTRIL) 30 MG tablet Take 1 tablet (30 mg total) by mouth daily. 90 tablet 3  . polyethylene glycol powder (GLYCOLAX/MIRALAX) 17 GM/SCOOP powder Take by mouth.    . QUEtiapine (SEROQUEL) 100 MG tablet TAKE ONE TABLET BY MOUTH AT BEDTIME 90 tablet 3  . zolendronic acid (ZOMETA) 4 MG/5ML injection Inject into the vein.    . Marland Kitchenbuprofen (ADVIL) 800 MG tablet Take 800 mg by mouth every 8 (eight) hours as needed. (Patient not taking: Reported on 12/27/2019)    . ondansetron (ZOFRAN) 8 MG tablet TAKE 1 TABLET (8 MG TOTAL) BY MOUTH EVERY 8 (EIGHT) HOURS AS NEEDED FOR NAUSEA FOR UP TO 30 DOSES. (Patient not taking: Reported on 01/28/2020)  3  . zolpidem (AMBIEN) 10 MG tablet Take 10 mg by mouth at bedtime as needed. (Patient not taking: Reported  on 01/28/2020)     No current facility-administered medications for this encounter.     REVIEW OF SYSTEMS: On review of systems, the patient reports that he has had pain in his low back predating his recent disease progression. He denies any neurologic symptoms or referred pain from this area. He does have trouble with pain in his right shoulder and this pain limits him from lifting the arm more than 45 degrees. He does have pain in his collar bones bilaterally as well as pain in his neck. His neck pain has been persistent over the last few months. No other complaints are noted.    PHYSICAL EXAM:  Wt Readings from Last 3 Encounters:  04/14/20 264 lb (119.7 kg)  01/28/20 250 lb (113.4 kg)  01/18/20 250 lb (113.4 kg)   Unable to assess due to encounter type.    ECOG = 0  0 - Asymptomatic (Fully active, able to carry on all predisease activities without restriction)  1 - Symptomatic but completely ambulatory (Restricted in physically strenuous activity but ambulatory and able to carry out work of a light or sedentary nature. For example, light housework, office work)  2 - Symptomatic, <50% in bed during the day (Ambulatory and  capable of all self care but unable to carry out any work activities. Up and about more than 50% of waking hours)  3 - Symptomatic, >50% in bed, but not bedbound (Capable of only limited self-care, confined to bed or chair 50% or more of waking hours)  4 - Bedbound (Completely disabled. Cannot carry on any self-care. Totally confined to bed or chair)  5 - Death   Eustace Pen MM, Creech RH, Tormey DC, et al. [phone]). "Toxicity and response criteria of the [facility]". [place] Oncol. 5 (6): 649-55    LABORATORY DATA:  Lab Results  Component Value Date   WBC 8.1 01/18/2020   HGB 11.5 (L) 01/18/2020   HCT 35.9 (L) 01/18/2020   MCV 93.5 01/18/2020   PLT 193 01/18/2020   Lab Results  Component Value Date   NA 138 01/18/2020   K 4.8 01/18/2020   CL 102 01/18/2020   CO2 27 01/18/2020   Lab Results  Component Value Date   ALT 17 01/18/2020   AST 23 01/18/2020   ALKPHOS 69 01/18/2020   BILITOT 0.7 01/18/2020      RADIOGRAPHY: No results found.     IMPRESSION/PLAN: 1. Multiple myeloma not having achieved remission with painful bone metastases. Dr. Lisbeth Renshaw discusses the patient's history including medical oncology intervention and radiation as well. He discusses the options of palliative radiotherapy to the right humeral head, bilateral scapulae, and bilateral clavicular heads. He agrees with Dr. Merleen Nicely that there does not appear to be a PET correlate to the location of pain in the neck, and agrees the MRI C spine appears stable without visible progressive disease. We discussed the risks, benefits, short, and long term effects of radiotherapy, and the patient is interested in proceeding. Dr. Lisbeth Renshaw discusses the delivery and logistics of radiotherapy and anticipates a course of 8 fractions of radiotherapy given his ongoing plans for chemotherapy. He will simulate on Wednesday or Thursday this week and he will receive a call to coordinate this. Written consent is  obtained and placed in the chart, a copy was provided to the patient. We expect to begin treatment on 04/20/20. 2. 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 The patient remains under the care of urology at [facility] as well. We will follow this expectantly as PSA remains <0.01 under the care of Dr. Alvie Heidelberg in medical oncology as well.  In a visit lasting 60 minutes, greater than 50% of the time was spent face to face discussing the patient's condition, in preparation for the discussion, and coordinating the patient's care.  The above documentation reflects my direct findings during this shared patient visit. Please see the separate note by Dr. Lisbeth Renshaw on this date for the remainder of the patient's plan of care.    Carola Rhine, PAC

## 2020-04-15 ENCOUNTER — Other Ambulatory Visit: Payer: Self-pay

## 2020-04-15 ENCOUNTER — Ambulatory Visit
Admission: RE | Admit: 2020-04-15 | Discharge: 2020-04-15 | Disposition: A | Discharge status: Home / Self Care | Payer: Medicare Other | Source: Ambulatory Visit | Attending: Radiation Oncology | Admitting: Radiation Oncology

## 2020-04-15 DIAGNOSIS — C7951 Secondary malignant neoplasm of bone: Secondary | ICD-10-CM | POA: Insufficient documentation

## 2020-04-15 DIAGNOSIS — C9000 Multiple myeloma not having achieved remission: Secondary | ICD-10-CM | POA: Diagnosis not present

## 2020-04-15 DIAGNOSIS — C9002 Multiple myeloma in relapse: Secondary | ICD-10-CM | POA: Diagnosis not present

## 2020-04-15 DIAGNOSIS — Z510 Encounter for antineoplastic radiation therapy: Secondary | ICD-10-CM | POA: Diagnosis not present

## 2020-04-15 DIAGNOSIS — Z8546 Personal history of malignant neoplasm of prostate: Secondary | ICD-10-CM | POA: Diagnosis not present

## 2020-04-16 DIAGNOSIS — Z8551 Personal history of malignant neoplasm of bladder: Secondary | ICD-10-CM | POA: Diagnosis not present

## 2020-04-16 DIAGNOSIS — C9000 Multiple myeloma not having achieved remission: Secondary | ICD-10-CM | POA: Diagnosis not present

## 2020-04-16 DIAGNOSIS — Z8546 Personal history of malignant neoplasm of prostate: Secondary | ICD-10-CM | POA: Diagnosis not present

## 2020-04-16 DIAGNOSIS — I10 Essential (primary) hypertension: Secondary | ICD-10-CM | POA: Diagnosis not present

## 2020-04-16 DIAGNOSIS — M6281 Muscle weakness (generalized): Secondary | ICD-10-CM | POA: Diagnosis not present

## 2020-04-17 DIAGNOSIS — C9002 Multiple myeloma in relapse: Secondary | ICD-10-CM | POA: Diagnosis not present

## 2020-04-17 DIAGNOSIS — Z510 Encounter for antineoplastic radiation therapy: Secondary | ICD-10-CM | POA: Diagnosis not present

## 2020-04-17 DIAGNOSIS — C9000 Multiple myeloma not having achieved remission: Secondary | ICD-10-CM | POA: Diagnosis not present

## 2020-04-17 DIAGNOSIS — C7951 Secondary malignant neoplasm of bone: Secondary | ICD-10-CM | POA: Diagnosis not present

## 2020-04-17 DIAGNOSIS — Z8546 Personal history of malignant neoplasm of prostate: Secondary | ICD-10-CM | POA: Diagnosis not present

## 2020-04-17 DIAGNOSIS — Z5111 Encounter for antineoplastic chemotherapy: Secondary | ICD-10-CM | POA: Diagnosis not present

## 2020-04-20 ENCOUNTER — Ambulatory Visit
Admission: RE | Admit: 2020-04-20 | Discharge: 2020-04-20 | Disposition: A | Discharge status: Home / Self Care | Payer: Medicare Other | Source: Ambulatory Visit | Attending: Radiation Oncology | Admitting: Radiation Oncology

## 2020-04-20 DIAGNOSIS — C7951 Secondary malignant neoplasm of bone: Secondary | ICD-10-CM | POA: Diagnosis not present

## 2020-04-20 DIAGNOSIS — Z510 Encounter for antineoplastic radiation therapy: Secondary | ICD-10-CM | POA: Diagnosis not present

## 2020-04-20 DIAGNOSIS — C9002 Multiple myeloma in relapse: Secondary | ICD-10-CM | POA: Diagnosis not present

## 2020-04-20 DIAGNOSIS — Z8546 Personal history of malignant neoplasm of prostate: Secondary | ICD-10-CM | POA: Diagnosis not present

## 2020-04-20 DIAGNOSIS — C9000 Multiple myeloma not having achieved remission: Secondary | ICD-10-CM | POA: Diagnosis not present

## 2020-04-21 ENCOUNTER — Ambulatory Visit
Admission: RE | Admit: 2020-04-21 | Discharge: 2020-04-21 | Disposition: A | Discharge status: Home / Self Care | Payer: Medicare Other | Source: Ambulatory Visit | Attending: Radiation Oncology | Admitting: Radiation Oncology

## 2020-04-21 DIAGNOSIS — C9000 Multiple myeloma not having achieved remission: Secondary | ICD-10-CM | POA: Diagnosis not present

## 2020-04-21 DIAGNOSIS — Z510 Encounter for antineoplastic radiation therapy: Secondary | ICD-10-CM | POA: Diagnosis not present

## 2020-04-21 DIAGNOSIS — C7951 Secondary malignant neoplasm of bone: Secondary | ICD-10-CM | POA: Diagnosis not present

## 2020-04-22 ENCOUNTER — Ambulatory Visit
Admission: RE | Admit: 2020-04-22 | Discharge: 2020-04-22 | Disposition: A | Discharge status: Home / Self Care | Payer: Medicare Other | Source: Ambulatory Visit | Attending: Radiation Oncology | Admitting: Radiation Oncology

## 2020-04-22 DIAGNOSIS — C9000 Multiple myeloma not having achieved remission: Secondary | ICD-10-CM | POA: Diagnosis not present

## 2020-04-22 DIAGNOSIS — Z510 Encounter for antineoplastic radiation therapy: Secondary | ICD-10-CM | POA: Diagnosis not present

## 2020-04-22 DIAGNOSIS — C7951 Secondary malignant neoplasm of bone: Secondary | ICD-10-CM | POA: Diagnosis not present

## 2020-04-27 ENCOUNTER — Ambulatory Visit
Admission: RE | Admit: 2020-04-27 | Discharge: 2020-04-27 | Disposition: A | Discharge status: Home / Self Care | Payer: Medicare Other | Source: Ambulatory Visit | Attending: Radiation Oncology | Admitting: Radiation Oncology

## 2020-04-27 DIAGNOSIS — C9000 Multiple myeloma not having achieved remission: Secondary | ICD-10-CM | POA: Diagnosis not present

## 2020-04-27 DIAGNOSIS — Z510 Encounter for antineoplastic radiation therapy: Secondary | ICD-10-CM | POA: Diagnosis not present

## 2020-04-27 DIAGNOSIS — C7951 Secondary malignant neoplasm of bone: Secondary | ICD-10-CM | POA: Diagnosis not present

## 2020-04-28 ENCOUNTER — Ambulatory Visit
Admission: RE | Admit: 2020-04-28 | Discharge: 2020-04-28 | Disposition: A | Discharge status: Home / Self Care | Payer: Medicare Other | Source: Ambulatory Visit | Attending: Radiation Oncology | Admitting: Radiation Oncology

## 2020-04-28 DIAGNOSIS — C9002 Multiple myeloma in relapse: Secondary | ICD-10-CM | POA: Diagnosis not present

## 2020-04-28 DIAGNOSIS — C7951 Secondary malignant neoplasm of bone: Secondary | ICD-10-CM | POA: Diagnosis not present

## 2020-04-28 DIAGNOSIS — Z8546 Personal history of malignant neoplasm of prostate: Secondary | ICD-10-CM | POA: Diagnosis not present

## 2020-04-28 DIAGNOSIS — Z510 Encounter for antineoplastic radiation therapy: Secondary | ICD-10-CM | POA: Diagnosis not present

## 2020-04-28 DIAGNOSIS — C9000 Multiple myeloma not having achieved remission: Secondary | ICD-10-CM | POA: Diagnosis not present

## 2020-04-29 ENCOUNTER — Ambulatory Visit
Admission: RE | Admit: 2020-04-29 | Discharge: 2020-04-29 | Disposition: A | Discharge status: Home / Self Care | Payer: Medicare Other | Source: Ambulatory Visit | Attending: Radiation Oncology | Admitting: Radiation Oncology

## 2020-04-29 DIAGNOSIS — C7951 Secondary malignant neoplasm of bone: Secondary | ICD-10-CM | POA: Insufficient documentation

## 2020-04-29 DIAGNOSIS — Z510 Encounter for antineoplastic radiation therapy: Secondary | ICD-10-CM | POA: Diagnosis not present

## 2020-04-29 DIAGNOSIS — C9000 Multiple myeloma not having achieved remission: Secondary | ICD-10-CM | POA: Insufficient documentation

## 2020-04-30 ENCOUNTER — Ambulatory Visit
Admission: RE | Admit: 2020-04-30 | Discharge: 2020-04-30 | Disposition: A | Discharge status: Home / Self Care | Payer: Medicare Other | Source: Ambulatory Visit | Attending: Radiation Oncology | Admitting: Radiation Oncology

## 2020-04-30 DIAGNOSIS — C7951 Secondary malignant neoplasm of bone: Secondary | ICD-10-CM | POA: Diagnosis not present

## 2020-04-30 DIAGNOSIS — Z510 Encounter for antineoplastic radiation therapy: Secondary | ICD-10-CM | POA: Diagnosis not present

## 2020-04-30 DIAGNOSIS — C9000 Multiple myeloma not having achieved remission: Secondary | ICD-10-CM | POA: Diagnosis not present

## 2020-05-01 ENCOUNTER — Encounter: Payer: Self-pay | Admitting: Radiation Oncology

## 2020-05-01 ENCOUNTER — Ambulatory Visit
Admission: RE | Admit: 2020-05-01 | Discharge: 2020-05-01 | Disposition: A | Discharge status: Home / Self Care | Payer: Medicare Other | Source: Ambulatory Visit | Attending: Radiation Oncology | Admitting: Radiation Oncology

## 2020-05-01 DIAGNOSIS — Z8546 Personal history of malignant neoplasm of prostate: Secondary | ICD-10-CM | POA: Diagnosis not present

## 2020-05-01 DIAGNOSIS — C7951 Secondary malignant neoplasm of bone: Secondary | ICD-10-CM | POA: Diagnosis not present

## 2020-05-01 DIAGNOSIS — C9000 Multiple myeloma not having achieved remission: Secondary | ICD-10-CM | POA: Diagnosis not present

## 2020-05-01 DIAGNOSIS — Z510 Encounter for antineoplastic radiation therapy: Secondary | ICD-10-CM | POA: Diagnosis not present

## 2020-05-01 DIAGNOSIS — C9002 Multiple myeloma in relapse: Secondary | ICD-10-CM | POA: Diagnosis not present

## 2020-05-06 DIAGNOSIS — C9000 Multiple myeloma not having achieved remission: Secondary | ICD-10-CM | POA: Diagnosis not present

## 2020-05-07 DIAGNOSIS — C9000 Multiple myeloma not having achieved remission: Secondary | ICD-10-CM | POA: Diagnosis not present

## 2020-05-07 DIAGNOSIS — Z5111 Encounter for antineoplastic chemotherapy: Secondary | ICD-10-CM | POA: Diagnosis not present

## 2020-05-09 ENCOUNTER — Inpatient Hospital Stay (HOSPITAL_BASED_OUTPATIENT_CLINIC_OR_DEPARTMENT_OTHER)
Admission: EM | Admit: 2020-05-09 | Discharge: 2020-05-11 | DRG: 841 | Disposition: A | Discharge status: Home / Self Care | Payer: Medicare Other | Attending: Internal Medicine | Admitting: Internal Medicine

## 2020-05-09 ENCOUNTER — Encounter (HOSPITAL_BASED_OUTPATIENT_CLINIC_OR_DEPARTMENT_OTHER): Payer: Self-pay | Admitting: Emergency Medicine

## 2020-05-09 ENCOUNTER — Emergency Department (HOSPITAL_BASED_OUTPATIENT_CLINIC_OR_DEPARTMENT_OTHER): Payer: Medicare Other

## 2020-05-09 ENCOUNTER — Other Ambulatory Visit: Payer: Self-pay

## 2020-05-09 DIAGNOSIS — Z8551 Personal history of malignant neoplasm of bladder: Secondary | ICD-10-CM | POA: Diagnosis not present

## 2020-05-09 DIAGNOSIS — Z87891 Personal history of nicotine dependence: Secondary | ICD-10-CM | POA: Diagnosis not present

## 2020-05-09 DIAGNOSIS — J040 Acute laryngitis: Secondary | ICD-10-CM | POA: Diagnosis present

## 2020-05-09 DIAGNOSIS — R0602 Shortness of breath: Secondary | ICD-10-CM | POA: Diagnosis not present

## 2020-05-09 DIAGNOSIS — E785 Hyperlipidemia, unspecified: Secondary | ICD-10-CM | POA: Diagnosis present

## 2020-05-09 DIAGNOSIS — C9000 Multiple myeloma not having achieved remission: Secondary | ICD-10-CM | POA: Diagnosis not present

## 2020-05-09 DIAGNOSIS — Z20822 Contact with and (suspected) exposure to covid-19: Secondary | ICD-10-CM | POA: Diagnosis present

## 2020-05-09 DIAGNOSIS — G4700 Insomnia, unspecified: Secondary | ICD-10-CM | POA: Diagnosis present

## 2020-05-09 DIAGNOSIS — Z8546 Personal history of malignant neoplasm of prostate: Secondary | ICD-10-CM

## 2020-05-09 DIAGNOSIS — Z923 Personal history of irradiation: Secondary | ICD-10-CM

## 2020-05-09 DIAGNOSIS — F32A Depression, unspecified: Secondary | ICD-10-CM | POA: Diagnosis present

## 2020-05-09 DIAGNOSIS — A419 Sepsis, unspecified organism: Secondary | ICD-10-CM | POA: Diagnosis not present

## 2020-05-09 DIAGNOSIS — M8448XA Pathological fracture, other site, initial encounter for fracture: Secondary | ICD-10-CM | POA: Diagnosis present

## 2020-05-09 DIAGNOSIS — R911 Solitary pulmonary nodule: Secondary | ICD-10-CM | POA: Diagnosis not present

## 2020-05-09 DIAGNOSIS — R42 Dizziness and giddiness: Secondary | ICD-10-CM | POA: Diagnosis not present

## 2020-05-09 DIAGNOSIS — D849 Immunodeficiency, unspecified: Secondary | ICD-10-CM | POA: Diagnosis present

## 2020-05-09 DIAGNOSIS — Z96652 Presence of left artificial knee joint: Secondary | ICD-10-CM | POA: Diagnosis present

## 2020-05-09 DIAGNOSIS — I2510 Atherosclerotic heart disease of native coronary artery without angina pectoris: Secondary | ICD-10-CM | POA: Diagnosis present

## 2020-05-09 DIAGNOSIS — I700 Atherosclerosis of aorta: Secondary | ICD-10-CM | POA: Diagnosis not present

## 2020-05-09 DIAGNOSIS — M84412A Pathological fracture, left shoulder, initial encounter for fracture: Secondary | ICD-10-CM | POA: Diagnosis present

## 2020-05-09 DIAGNOSIS — Z888 Allergy status to other drugs, medicaments and biological substances status: Secondary | ICD-10-CM

## 2020-05-09 DIAGNOSIS — R6510 Systemic inflammatory response syndrome (SIRS) of non-infectious origin without acute organ dysfunction: Secondary | ICD-10-CM | POA: Diagnosis not present

## 2020-05-09 DIAGNOSIS — F1021 Alcohol dependence, in remission: Secondary | ICD-10-CM | POA: Diagnosis present

## 2020-05-09 DIAGNOSIS — M1990 Unspecified osteoarthritis, unspecified site: Secondary | ICD-10-CM | POA: Diagnosis present

## 2020-05-09 DIAGNOSIS — Z79899 Other long term (current) drug therapy: Secondary | ICD-10-CM

## 2020-05-09 DIAGNOSIS — Z96653 Presence of artificial knee joint, bilateral: Secondary | ICD-10-CM | POA: Diagnosis present

## 2020-05-09 DIAGNOSIS — Z981 Arthrodesis status: Secondary | ICD-10-CM | POA: Diagnosis not present

## 2020-05-09 DIAGNOSIS — C9002 Multiple myeloma in relapse: Secondary | ICD-10-CM | POA: Diagnosis present

## 2020-05-09 DIAGNOSIS — I10 Essential (primary) hypertension: Secondary | ICD-10-CM | POA: Diagnosis present

## 2020-05-09 DIAGNOSIS — Z800 Family history of malignant neoplasm of digestive organs: Secondary | ICD-10-CM | POA: Diagnosis not present

## 2020-05-09 DIAGNOSIS — R5383 Other fatigue: Secondary | ICD-10-CM | POA: Diagnosis not present

## 2020-05-09 DIAGNOSIS — I517 Cardiomegaly: Secondary | ICD-10-CM | POA: Diagnosis not present

## 2020-05-09 DIAGNOSIS — R509 Fever, unspecified: Secondary | ICD-10-CM | POA: Diagnosis not present

## 2020-05-09 LAB — COMPREHENSIVE METABOLIC PANEL
ALT: 13 U/L (ref 0–44)
AST: 17 U/L (ref 15–41)
Albumin: 3.6 g/dL (ref 3.5–5.0)
Alkaline Phosphatase: 59 U/L (ref 38–126)
Anion gap: 10 (ref 5–15)
BUN: 16 mg/dL (ref 8–23)
CO2: 26 mmol/L (ref 22–32)
Calcium: 9 mg/dL (ref 8.9–10.3)
Chloride: 105 mmol/L (ref 98–111)
Creatinine, Ser: 1.05 mg/dL (ref 0.61–1.24)
GFR, Estimated: >60 mL/min (ref >60)
Glucose, Bld: 106 mg/dL — ABNORMAL HIGH (ref 70–99)
Potassium: 4.6 mmol/L (ref 3.5–5.1)
Sodium: 141 mmol/L (ref 135–145)
Total Bilirubin: 0.6 mg/dL (ref 0.3–1.2)
Total Protein: 6.8 g/dL (ref 6.5–8.1)

## 2020-05-09 LAB — CBC WITH DIFFERENTIAL/PLATELET
Abs Immature Granulocytes: 0.05 10*3/uL (ref 0.00–0.07)
Basophils Absolute: 0 10*3/uL (ref 0.0–0.1)
Basophils Relative: 0 %
Eosinophils Absolute: 0.1 10*3/uL (ref 0.0–0.5)
Eosinophils Relative: 1 %
HCT: 32 % — ABNORMAL LOW (ref 39.0–52.0)
Hemoglobin: 10.2 g/dL — ABNORMAL LOW (ref 13.0–17.0)
Immature Granulocytes: 1 %
Lymphocytes Relative: 5 %
Lymphs Abs: 0.3 10*3/uL — ABNORMAL LOW (ref 0.7–4.0)
MCH: 29.7 pg (ref 26.0–34.0)
MCHC: 31.9 g/dL (ref 30.0–36.0)
MCV: 93 fL (ref 80.0–100.0)
Monocytes Absolute: 1.1 10*3/uL — ABNORMAL HIGH (ref 0.1–1.0)
Monocytes Relative: 17 %
Neutro Abs: 4.7 10*3/uL (ref 1.7–7.7)
Neutrophils Relative %: 76 %
Platelets: 133 10*3/uL — ABNORMAL LOW (ref 150–400)
RBC: 3.44 MIL/uL — ABNORMAL LOW (ref 4.22–5.81)
RDW: 16.2 % — ABNORMAL HIGH (ref 11.5–15.5)
WBC: 6.2 10*3/uL (ref 4.0–10.5)
nRBC: 0 % (ref 0.0–0.2)

## 2020-05-09 LAB — URINALYSIS, MICROSCOPIC (REFLEX)

## 2020-05-09 LAB — URINALYSIS, ROUTINE W REFLEX MICROSCOPIC
Bilirubin Urine: NEGATIVE
Glucose, UA: NEGATIVE mg/dL
Ketones, ur: NEGATIVE mg/dL
Leukocytes,Ua: NEGATIVE
Nitrite: NEGATIVE
Protein, ur: NEGATIVE mg/dL
Specific Gravity, Urine: 1.01 (ref 1.005–1.030)
pH: 6.5 (ref 5.0–8.0)

## 2020-05-09 LAB — APTT: aPTT: 27 seconds (ref 24–36)

## 2020-05-09 LAB — PROTIME-INR
INR: 1 (ref 0.8–1.2)
Prothrombin Time: 12.6 seconds (ref 11.4–15.2)

## 2020-05-09 LAB — LACTIC ACID, PLASMA
Lactic Acid, Venous: 0.9 mmol/L (ref 0.5–1.9)
Lactic Acid, Venous: 0.9 mmol/L (ref 0.5–1.9)

## 2020-05-09 LAB — RESP PANEL BY RT-PCR (FLU A&B, COVID) ARPGX2
Influenza A by PCR: NEGATIVE
Influenza B by PCR: NEGATIVE
SARS Coronavirus 2 by RT PCR: NEGATIVE

## 2020-05-09 MED ORDER — ACETAMINOPHEN 325 MG PO TABS
650.0000 mg | ORAL_TABLET | Freq: Once | ORAL | Status: AC | PRN
  Administered 2020-05-09: 650 mg via ORAL
  Filled 2020-05-09: qty 2

## 2020-05-09 MED ORDER — VANCOMYCIN HCL IN DEXTROSE 1-5 GM/200ML-% IV SOLN: 1000.0000 mg | Freq: Once | INTRAVENOUS | Status: DC

## 2020-05-09 MED ORDER — SODIUM CHLORIDE 0.9 % IV SOLN
2.0000 g | Freq: Once | INTRAVENOUS | Status: AC
  Administered 2020-05-09: 2 g via INTRAVENOUS
  Filled 2020-05-09: qty 2

## 2020-05-09 MED ORDER — METRONIDAZOLE 500 MG PO TABS
500.0000 mg | ORAL_TABLET | Freq: Three times a day (TID) | ORAL | Status: DC
  Administered 2020-05-09: 500 mg via ORAL
  Filled 2020-05-09: qty 1

## 2020-05-09 MED ORDER — VANCOMYCIN HCL IN DEXTROSE 1-5 GM/200ML-% IV SOLN
1000.0000 mg | INTRAVENOUS | Status: AC
  Administered 2020-05-09: 1000 mg via INTRAVENOUS
  Filled 2020-05-09: qty 200

## 2020-05-09 MED ORDER — VANCOMYCIN HCL 750 MG/150ML IV SOLN
750.0000 mg | Freq: Two times a day (BID) | INTRAVENOUS | Status: DC
  Administered 2020-05-10 – 2020-05-11 (×3): 750 mg via INTRAVENOUS
  Filled 2020-05-09 (×4): qty 150

## 2020-05-09 MED ORDER — SODIUM CHLORIDE 0.9 % IV SOLN
INTRAVENOUS | Status: DC | PRN
  Administered 2020-05-09: 500 mL via INTRAVENOUS

## 2020-05-09 MED ORDER — IOHEXOL 350 MG/ML SOLN
100.0000 mL | Freq: Once | INTRAVENOUS | Status: AC | PRN
  Administered 2020-05-09: 100 mL via INTRAVENOUS

## 2020-05-09 MED ORDER — IOHEXOL 300 MG/ML  SOLN: 100.0000 mL | Freq: Once | INTRAMUSCULAR | Status: DC | PRN

## 2020-05-09 MED ORDER — LACTATED RINGERS IV SOLN: INTRAVENOUS | Status: AC

## 2020-05-09 MED ORDER — SODIUM CHLORIDE 0.9 % IV SOLN
2.0000 g | Freq: Three times a day (TID) | INTRAVENOUS | Status: DC
  Administered 2020-05-10 – 2020-05-11 (×4): 2 g via INTRAVENOUS
  Filled 2020-05-09 (×5): qty 2

## 2020-05-09 NOTE — ED Notes (Signed)
Patient transported to CT and returned at this time

## 2020-05-09 NOTE — Progress Notes (Signed)
Pharmacy Antibiotic Note  Derek Castro is a 79 y.o. male admitted on 05/09/2020 with fever, SOB and chills concerning for an infection of unknown source.  Pharmacy has been consulted for Cefepime and vancomycin dosing.  WBC wnl, SCr wnl, LA 0.9   Plan: -Cefepime 2 gm IV Q 8 hours -Vancomycin 2 gm IV load followed by vancomycin 75 mg IV Q 12 hours -Monitor CBC, renal fx, cultures and clinical progress -VT at SS   Height: 5\' 10"  (177.8 cm) Weight: 117.9 kg (260 lb) IBW/kg (Calculated) : 73  Temp (24hrs), Avg:101.3 F (38.5 C), Min:101.3 F (38.5 C), Max:101.3 F (38.5 C)  No results for input(s): WBC, CREATININE, LATICACIDVEN, VANCOTROUGH, VANCOPEAK, VANCORANDOM, GENTTROUGH, GENTPEAK, GENTRANDOM, TOBRATROUGH, TOBRAPEAK, TOBRARND, AMIKACINPEAK, AMIKACINTROU, AMIKACIN in the last 168 hours.  CrCl cannot be calculated (Patient's most recent lab result is older than the maximum 21 days allowed.).    Allergies  Allergen Reactions  . Tamiflu [Oseltamivir] Nausea And Vomiting    Antimicrobials this admission: Cefepime 12/11 >>  Vanc 12/11 >>   Dose adjustments this admission:  Microbiology results: 12/11 BCx:  12/11 UCx:     Thank you for allowing pharmacy to be a part of this patient's care.  Albertina Parr, PharmD., BCPS, BCCCP Clinical Pharmacist Please refer to [facility] for unit-specific pharmacist

## 2020-05-09 NOTE — ED Provider Notes (Signed)
[facility]ERGENCY DEPARTMENT Provider Note   CSN: [id number] Arrival date & time: 05/09/20  1541     History Chief Complaint  Patient presents with  . Fever    Chills, dizzy, nausea, body aches, low oxygen in triage.      [facility] is a 79 y.o. male.  Patient with multiple myeloma not having achieved remission.  Patient is new to the area moved from [place].  But getting chemo treatment radiation treatment at [facility].  Was seen just on December 9 where he received chemotherapy.  Patient last evening suddenly became ill with fever chills and feeling short of breath.  Patient does not use oxygen at home.  Temp upon arrival here was 101.3 respirations 22-24.  Oxygen saturation 84%.  Blood pressure was good at 167/69.  His past medical history significant for alcohol lives him in remission.  History of bladder cancer.  Hyperlipidemia hypertension.  And as mentioned the multiple myeloma.  Patient has had all 3 of the Covid vaccines including the booster        Past Medical History:  Diagnosis Date  . 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  . Bladder cancer ([facility]) 07/2019   07/10/2019 TURBT/gemcitabine instillation - LG, Ta, no muscle present  . Bone lesion   . Depression   . Fall 01/06/2018   pt fell wearing crocs while going up muddy embankment and hit head,no LOC went to ER no fractures pt sent home  . Family history of colon cancer in mother   . 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 Hyperlipidemia    Pt declined statin 08/2017  . Hypertension   . Insomnia    uses quetiapine for this  . Multiple myeloma ([name])    [facility]  . Osteoarthritis    C-spine--hx of fusion surgery.  R foot. Knees (hx of L TKA)  . Prediabetes   . Prostate cancer [facility])    [facility]    Patient Active Problem List   Diagnosis  Date Noted  . Malignant neoplasm of posterior wall of urinary bladder ([facility]) 07/18/2019  . Bladder tumor 07/07/2019  . 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 Depression   . Ganglion cyst 04/09/2017  . Erectile dysfunction 12/20/2016  . Osteoarthrosis, ankle and foot 06/03/2016  . Tibialis posterior dysfunction 06/03/2016  . Dysuria 06/02/2016  . Malignant neoplasm of prostate ([facility]) 03/28/2016  . History of prostate cancer 01/20/2016  . Bone lesion 08/12/2015  . Anemia due to chemotherapy 07/13/2015  . Pain in left knee 11/24/2014  . Multiple myeloma in relapse ([facility]) 07/04/2014    Past Surgical History:  Procedure Laterality Date  . ANTERIOR FUSION CERVICAL SPINE  2002  . autologous stem cell transplant  2010 and 2012   [facility] in [place].  Marland Kitchen COLONOSCOPY  2015   Recall 5 yrs ([facility] in [place], [place])  . CYSTOSCOPY  07/05/2019   for gross hematuria; noninvasive-appearing tumor found on cystoscopy->plan for TURBT.  Marland Kitchen EXCISIONAL TOTAL KNEE ARTHROPLASTY  2014 and 2016   Both knees  . LAPAROSCOPIC RETROPUBIC PROSTATECTOMY     + hx of radiation to pelvis.  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  Procedure: LEFT EXCISION MASS;  Surgeon: Leta Baptist, MD;  Location: [facility];  Service: ENT;  Laterality: Left;  . SKIN FULL THICKNESS GRAFT Left 01/31/2018   Procedure: SKIN GRAFT FULL THICKNESS;  Surgeon: Leta Baptist, MD;  Location: [facility];  Service: ENT;  Laterality: Left;  . TRANSURETHRAL RESECTION OF BLADDER TUMOR WITH GYRUS (TURBT-GYRUS)  07/10/2019   07/10/2019 TURBT/gemcitabine instillation - LG, Ta, no muscle present       Family History  Problem Relation Age of Onset  . Cancer Mother        colon    Social History   Tobacco Use  . Smoking status: Former Smoker    Packs/day: 1.00    Years: 5.00    Pack years: 5.00    Types: Cigarettes    Quit  date: 02/11/1996    Years since quitting: 24.2  . Smokeless tobacco: Never Used  Vaping Use  . Vaping Use: Never used  Substance Use Topics  . Alcohol use: No  . Drug use: No    Home Medications Prior to Admission medications   Medication Sig Start Date End Date Taking? Authorizing Provider  acyclovir (ZOVIRAX) 400 MG tablet TAKE ONE TABLET BY MOUTH DAILY 06/14/19   McGowen, Adrian Blackwater, MD  amLODipine (NORVASC) 10 MG tablet TAKE ONE TABLET BY MOUTH EVERY DAY 04/06/20   McGowen, Adrian Blackwater, MD  cefdinir (OMNICEF) 300 MG capsule Take 1 capsule (300 mg total) by mouth 2 (two) times daily. 01/28/20   McGowen, Adrian Blackwater, MD  desonide (DESOWEN) 0.05 % ointment Apply 1 application topically as needed.     [provider]  dexamethasone (DECADRON) 4 MG tablet Take 38m (2 x 425mtablets) by mouth once on day 22 of treatment cycles 1-9. 03/17/20   [provider]  ibuprofen (ADVIL) 800 MG tablet Take 800 mg by mouth every 8 (eight) hours as needed. Patient not taking: Reported on 12/27/2019 07/10/19   [provider]  lisinopril (ZESTRIL) 30 MG tablet Take 1 tablet (30 mg total) by mouth daily. 12/19/19   McGowen, PhAdrian BlackwaterMD  ondansetron (ZOFRAN) 8 MG tablet TAKE 1 TABLET (8 MG TOTAL) BY MOUTH EVERY 8 (EIGHT) HOURS AS NEEDED FOR NAUSEA FOR UP TO 30 DOSES. Patient not taking: Reported on 01/28/2020 01/18/16   [provider]  polyethylene glycol powder (GLYCOLAX/MIRALAX) 17 GM/SCOOP powder Take by mouth. 01/31/20   [provider]  QUEtiapine (SEROQUEL) 100 MG tablet TAKE ONE TABLET BY MOUTH AT BEDTIME 07/31/19   McGowen, PhAdrian BlackwaterMD  zolendronic acid (ZOMETA) 4 MG/5ML injection Inject into the vein.    [provider]  zolpidem (AMBIEN) 10 MG tablet Take 10 mg by mouth at bedtime as needed. Patient not taking: Reported on 01/28/2020 03/20/19   [provider]    Allergies    Tamiflu [oseltamivir]  Review of Systems   Review of Systems   Constitutional: Positive for chills and fever.  HENT: Negative for congestion, rhinorrhea and sore throat.   Eyes: Negative for visual disturbance.  Respiratory: Positive for shortness of breath. Negative for cough.   Cardiovascular: Negative for chest pain and leg swelling.  Gastrointestinal: Negative for abdominal pain, diarrhea, nausea and vomiting.  Genitourinary: Negative for dysuria.  Musculoskeletal: Positive for myalgias. Negative for back pain and neck pain.  Skin: Negative for rash.  Neurological: Negative for dizziness, light-headedness and headaches.  Hematological: Does not bruise/bleed easily.  Psychiatric/Behavioral: Negative for confusion.    Physical Exam Updated Vital Signs BP (!) 130/52   Pulse 78  Temp 99.1 F (37.3 C) (Oral)   Resp (!) 26   Ht 1.778 m ('5\' 10"' )   Wt 117.9 kg   SpO2 97%   BMI 37.31 kg/m   Physical Exam Vitals and nursing note reviewed.  Constitutional:      General: He is in acute distress.     Appearance: Normal appearance. He is well-developed and well-nourished.  HENT:     Head: Normocephalic and atraumatic.  Eyes:     Extraocular Movements: Extraocular movements intact.     Conjunctiva/sclera: Conjunctivae normal.     Pupils: Pupils are equal, round, and reactive to light.  Cardiovascular:     Rate and Rhythm: Normal rate and regular rhythm.     Heart sounds: No murmur heard.   Pulmonary:     Effort: Pulmonary effort is normal. No respiratory distress.     Breath sounds: Normal breath sounds. No wheezing or rales.  Abdominal:     Palpations: Abdomen is soft.     Tenderness: There is no abdominal tenderness.  Musculoskeletal:        General: No swelling or edema. Normal range of motion.     Cervical back: Normal range of motion and neck supple.     Comments: Small wound to the right anterior shin but no surrounding erythema.  Has a Band-Aid over it.  Skin:    General: Skin is warm and dry.     Capillary Refill: Capillary  refill takes less than 2 seconds.  Neurological:     General: No focal deficit present.     Mental Status: He is alert and oriented to person, place, and time.     Cranial Nerves: No cranial nerve deficit.     Sensory: No sensory deficit.  Psychiatric:        Mood and Affect: Mood and affect normal.     ED Results / Procedures / Treatments   Labs (all labs ordered are listed, but only abnormal results are displayed) Labs Reviewed  COMPREHENSIVE METABOLIC PANEL - Abnormal; Notable for the following components:      Result Value   Glucose, Bld 106 (*)    All other components within normal limits  CBC WITH DIFFERENTIAL/PLATELET - Abnormal; Notable for the following components:   RBC 3.44 (*)    Hemoglobin 10.2 (*)    HCT 32.0 (*)    RDW 16.2 (*)    Platelets 133 (*)    Lymphs Abs 0.3 (*)    Monocytes Absolute 1.1 (*)    All other components within normal limits  URINALYSIS, ROUTINE W REFLEX MICROSCOPIC - Abnormal; Notable for the following components:   Hgb urine dipstick TRACE (*)    All other components within normal limits  URINALYSIS, MICROSCOPIC (REFLEX) - Abnormal; Notable for the following components:   Bacteria, UA FEW (*)    All other components within normal limits  RESP PANEL BY RT-PCR (FLU A&B, COVID) ARPGX2  CULTURE, BLOOD (ROUTINE X 2)  CULTURE, BLOOD (ROUTINE X 2)  URINE CULTURE  LACTIC ACID, PLASMA  PROTIME-INR  APTT  LACTIC ACID, PLASMA    EKG EKG Interpretation  Date/Time:  Saturday May 09 2020 15:54:47 EST Ventricular Rate:  80 PR Interval:  160 QRS Duration: 116 QT Interval:  384 QTC Calculation: 442 R Axis:   -37 Text Interpretation: Normal sinus rhythm Left axis deviation Incomplete right bundle branch block Abnormal ECG Confirmed by Fredia Sorrow [phone]) on 05/09/2020 4:07:23 PM   Radiology CT Angio Chest PE W/Cm &/  Or Wo Cm  Result Date: 05/09/2020 CLINICAL DATA:  Fever, shortness of breath, chills. Multiple myeloma. Chemo  radiation therapy. EXAM: CT ANGIOGRAPHY CHEST WITH CONTRAST TECHNIQUE: Multidetector CT imaging of the chest was performed using the standard protocol during bolus administration of intravenous contrast. Multiplanar CT image reconstructions and MIPs were obtained to evaluate the vascular anatomy. CONTRAST:  116m OMNIPAQUE IOHEXOL 350 MG/ML SOLN COMPARISON:  CT chest 01/18/2020. FINDINGS: Cardiovascular: Fairly satisfactory opacification of the pulmonary arteries to the segmental level. No evidence of pulmonary embolism. Normal heart size. No significant pericardial effusion. The thoracic aorta is normal in caliber. Mild to moderate calcified and noncalcified cyst atherosclerotic plaque of the thoracic aorta. At least 3 vessel mild coronary artery calcifications. Mediastinum/Nodes: No enlarged mediastinal, hilar, or axillary lymph nodes. Thyroid gland, trachea, and esophagus demonstrate no significant findings. Lungs/Pleura: Interval increase in size of a 2.8 x 2.1 posteromedial left lower lobe nodule (from 2.3 x 1.9 cm). Associated calcification again noted. Otherwise no new pulmonary nodule. No pulmonary mass. No focal consolidation. No pleural effusion. No pneumothorax. Upper Abdomen: No acute abnormality. Musculoskeletal: Similar-appearing diffuse appendicular and axial skeleton. Similar-appearing lytic lesion with associated 1.3 cm soft tissue component along the anterior left second rib. Similar-appearing cortical irregularity and expansile lytic lesion of the right anterolateral fifth rib (6:49). Interval development of a subacute minimally displaced pathologic fracture through the left ninth posterior rib (6:68). Interval development of vertebral body height loss of the T5 vertebral body likely representing a pathologic compression fracture in the setting of lytic lesions consistent with known multiple myeloma. Interval development of a comminuted subacute fracture of the proximal left clavicle. Degenerative  changes of bilateral shoulders. Review of the MIP images confirms the above findings. IMPRESSION: 1. No central or segmental pulmonary embolus. Unable to evaluate at the subsegmental level due to timing of contrast. 2. Interval increase in size of a 2.8 x 2.1 cm posteromedial left lower lobe nodule. 3. Interval development of a subacute, minimally displaced, pathologic fracture through the left posterior 9th rib. 4. Interval development of a subacute, comminuted, pathologic fracture of the proximal left clavicle 5. Interval development of an age-indeterminate pathologic compression fracture of the T5 vertebral body. Correlate with tenderness to palpation along the T5 level to evaluate for an acute component to the pathologic fracture. 6. Similar-appearing diffuse axial and appendicular skeleton lytic lesions consistent with known multiple myeloma. Electronically Signed   By: MIven FinnM.D.   On: 05/09/2020 19:02   DG Chest Port 1 View  Result Date: 05/09/2020 CLINICAL DATA:  Questionable sepsis. Chills, body ache, fatigue and shortness of breath. Symptom onset after chemotherapy for multiple myeloma. EXAM: PORTABLE CHEST 1 VIEW COMPARISON:  Chest radiograph and CT 01/18/2020. PET CT 03/13/2020 FINDINGS: Linear atelectasis or scarring in the left lower lobe, unchanged from prior. Previous nodular opacity at the left lung base on CT is not well demonstrated by radiograph. Improved right basilar aeration. Stable cardiomegaly. Unchanged mediastinal contours. Possible tiny pleural effusions. No pneumothorax. No pulmonary edema. Lytic lesion in left second rib again seen. Peripherally sclerotic lesion involving the inferior aspect of the right scapula which was previously evaluated with PET. Periosteal reaction in the right proximal humerus at site of previous lucencies on PET. Callus formation about right fifth rib. Healing distal right clavicle fracture. Remote left clavicle fracture. Many of the bone lesions  on prior PET-CT are not well resolved by radiograph. IMPRESSION: 1. Unchanged cardiomegaly. Possible tiny pleural effusions. 2. No evidence of pneumonia. 3.  Previous nodular opacity at the left lung base on CT is not well demonstrated by radiograph. 4. Bone lesions consistent with multiple myeloma, previously evaluated on PET CT. Electronically Signed   By: Keith Rake M.D.   On: 05/09/2020 17:03    Procedures Procedures (including critical care time)  CRITICAL CARE Performed by: Fredia Sorrow Total critical care time: 45 minutes Critical care time was exclusive of separately billable procedures and treating other patients. Critical care was necessary to treat or prevent imminent or life-threatening deterioration. Critical care was time spent personally by me on the following activities: development of treatment plan with patient and/or surrogate as well as nursing, discussions with consultants, evaluation of patient's response to treatment, examination of patient, obtaining history from patient or surrogate, ordering and performing treatments and interventions, ordering and review of laboratory studies, ordering and review of radiographic studies, pulse oximetry and re-evaluation of patient's condition.  Medications Ordered in ED Medications  lactated ringers infusion ( Intravenous Restarted 05/09/20 1830)  metroNIDAZOLE (FLAGYL) tablet 500 mg (500 mg Oral Given 05/09/20 1751)  vancomycin (VANCOCIN) IVPB 1000 mg/200 mL premix (0 mg Intravenous Stopped 05/09/20 1930)  0.9 %  sodium chloride infusion (0 mLs Intravenous Stopped 05/09/20 2010)  vancomycin (VANCOREADY) IVPB 750 mg/150 mL (has no administration in time range)  ceFEPIme (MAXIPIME) 2 g in sodium chloride 0.9 % 100 mL IVPB (has no administration in time range)  acetaminophen (TYLENOL) tablet 650 mg (650 mg Oral Given 05/09/20 1602)  ceFEPIme (MAXIPIME) 2 g in sodium chloride 0.9 % 100 mL IVPB (0 g Intravenous Stopped 05/09/20  1745)  iohexol (OMNIPAQUE) 350 MG/ML injection 100 mL (100 mLs Intravenous Contrast Given 05/09/20 1802)    ED Course  I have reviewed the triage vital signs and the nursing notes.  Pertinent labs & imaging results that were available during my care of the patient were reviewed by me and considered in my medical decision making (see chart for details).    MDM Rules/Calculators/A&P                           Patient with vital signs suggestive of possible sepsis.  Sepsis protocol initiated.  Not hypotensive so not requiring 30 cc/kg of fluids.  Patient lactic acid ended up not being elevated.  No leukocytosis.  And differential without any significant abnormalities.  Patient had chest x-ray without any acute findings.  Got CT angio chest also without any acute findings no evidence of any significant pulmonary embolus.  Patient was treated with broad-spectrum antibiotics and blood cultures were done and are pending.  Covid testing influenza testing negative.  This with Dr. Jana Hakim who feels patient needs admission to the hospital service at [facility].  Is concerned since he is Immuno compromise.  Will discuss with hospitalist for admission.   Spite extensive work-up no explanation for the hypoxia.  Patient's fever has improved here.     Final Clinical Impression(s) / ED Diagnoses Final diagnoses:  SIRS (systemic inflammatory response syndrome) ([place])  Multiple myeloma not having achieved remission [facility])    Rx / DC Orders ED Discharge Orders    None       Fredia Sorrow, MD 05/09/20 2059

## 2020-05-09 NOTE — ED Notes (Signed)
ED Provider at bedside. 

## 2020-05-09 NOTE — ED Triage Notes (Signed)
Brought by wife for fever, SOB, chills, and just not feeling well.  Patient has multiple myeloma.  Had radiation last week and Chemo on Thursday at [facility].  Sat hanging out between 84-90 in triage.  Does not wear oxygen at home. 

## 2020-05-09 NOTE — ED Notes (Signed)
Attempt x 2 to call report to 4  [facility] ([phone]). Attempted to contact [place] also [phone]) with no success

## 2020-05-10 ENCOUNTER — Encounter (HOSPITAL_COMMUNITY): Payer: Self-pay | Admitting: Internal Medicine

## 2020-05-10 DIAGNOSIS — E785 Hyperlipidemia, unspecified: Secondary | ICD-10-CM | POA: Diagnosis present

## 2020-05-10 DIAGNOSIS — I2510 Atherosclerotic heart disease of native coronary artery without angina pectoris: Secondary | ICD-10-CM | POA: Diagnosis present

## 2020-05-10 DIAGNOSIS — I700 Atherosclerosis of aorta: Secondary | ICD-10-CM | POA: Diagnosis present

## 2020-05-10 DIAGNOSIS — R6510 Systemic inflammatory response syndrome (SIRS) of non-infectious origin without acute organ dysfunction: Secondary | ICD-10-CM

## 2020-05-10 LAB — BASIC METABOLIC PANEL
Anion gap: 9 (ref 5–15)
BUN: 13 mg/dL (ref 8–23)
CO2: 25 mmol/L (ref 22–32)
Calcium: 8.5 mg/dL — ABNORMAL LOW (ref 8.9–10.3)
Chloride: 105 mmol/L (ref 98–111)
Creatinine, Ser: 0.92 mg/dL (ref 0.61–1.24)
GFR, Estimated: >60 mL/min (ref >60)
Glucose, Bld: 99 mg/dL (ref 70–99)
Potassium: 4 mmol/L (ref 3.5–5.1)
Sodium: 139 mmol/L (ref 135–145)

## 2020-05-10 LAB — CBC WITH DIFFERENTIAL/PLATELET
Abs Immature Granulocytes: 0.06 10*3/uL (ref 0.00–0.07)
Basophils Absolute: 0 10*3/uL (ref 0.0–0.1)
Basophils Relative: 0 %
Eosinophils Absolute: 0.1 10*3/uL (ref 0.0–0.5)
Eosinophils Relative: 3 %
HCT: 31.1 % — ABNORMAL LOW (ref 39.0–52.0)
Hemoglobin: 9.8 g/dL — ABNORMAL LOW (ref 13.0–17.0)
Immature Granulocytes: 2 %
Lymphocytes Relative: 9 %
Lymphs Abs: 0.3 10*3/uL — ABNORMAL LOW (ref 0.7–4.0)
MCH: 30.3 pg (ref 26.0–34.0)
MCHC: 31.5 g/dL (ref 30.0–36.0)
MCV: 96.3 fL (ref 80.0–100.0)
Monocytes Absolute: 0.7 10*3/uL (ref 0.1–1.0)
Monocytes Relative: 17 %
Neutro Abs: 2.7 10*3/uL (ref 1.7–7.7)
Neutrophils Relative %: 69 %
Platelets: 112 10*3/uL — ABNORMAL LOW (ref 150–400)
RBC: 3.23 MIL/uL — ABNORMAL LOW (ref 4.22–5.81)
RDW: 16.2 % — ABNORMAL HIGH (ref 11.5–15.5)
WBC: 3.9 10*3/uL — ABNORMAL LOW (ref 4.0–10.5)
nRBC: 0 % (ref 0.0–0.2)

## 2020-05-10 LAB — HEMOGLOBIN A1C
Hgb A1c MFr Bld: 5.9 % — ABNORMAL HIGH (ref 4.8–5.6)
Mean Plasma Glucose: 122.63 mg/dL

## 2020-05-10 LAB — GLUCOSE, CAPILLARY
Glucose-Capillary: 90 mg/dL (ref 70–99)
Glucose-Capillary: 94 mg/dL (ref 70–99)
Glucose-Capillary: 120 mg/dL — ABNORMAL HIGH (ref 70–99)
Glucose-Capillary: 115 mg/dL — ABNORMAL HIGH (ref 70–99)

## 2020-05-10 MED ORDER — ENOXAPARIN SODIUM 40 MG/0.4ML ~~LOC~~ SOLN
40.0000 mg | SUBCUTANEOUS | Status: DC
  Administered 2020-05-10: 40 mg via SUBCUTANEOUS
  Filled 2020-05-10 (×2): qty 0.4

## 2020-05-10 MED ORDER — ACYCLOVIR 400 MG PO TABS
400.0000 mg | ORAL_TABLET | Freq: Every day | ORAL | Status: DC
  Administered 2020-05-10 – 2020-05-11 (×2): 400 mg via ORAL
  Filled 2020-05-10 (×2): qty 1

## 2020-05-10 MED ORDER — LISINOPRIL 20 MG PO TABS
30.0000 mg | ORAL_TABLET | Freq: Every day | ORAL | Status: DC
  Administered 2020-05-10 – 2020-05-11 (×2): 30 mg via ORAL
  Filled 2020-05-10 (×2): qty 1

## 2020-05-10 MED ORDER — QUETIAPINE FUMARATE 100 MG PO TABS
100.0000 mg | ORAL_TABLET | Freq: Every day | ORAL | Status: DC
  Administered 2020-05-10 (×2): 100 mg via ORAL
  Filled 2020-05-10 (×2): qty 1

## 2020-05-10 MED ORDER — AMLODIPINE BESYLATE 10 MG PO TABS
10.0000 mg | ORAL_TABLET | Freq: Every day | ORAL | Status: DC
  Administered 2020-05-10 – 2020-05-11 (×2): 10 mg via ORAL
  Filled 2020-05-10 (×2): qty 1

## 2020-05-10 MED ORDER — ONDANSETRON HCL 4 MG PO TABS
4.0000 mg | ORAL_TABLET | Freq: Four times a day (QID) | ORAL | Status: DC | PRN
  Administered 2020-05-10: 4 mg via ORAL
  Filled 2020-05-10: qty 1

## 2020-05-10 MED ORDER — ACETAMINOPHEN 325 MG PO TABS: 650.0000 mg | ORAL_TABLET | Freq: Four times a day (QID) | ORAL | Status: DC | PRN

## 2020-05-10 MED ORDER — METRONIDAZOLE 500 MG PO TABS: 500.0000 mg | ORAL_TABLET | Freq: Three times a day (TID) | ORAL | Status: DC

## 2020-05-10 MED ORDER — QUETIAPINE FUMARATE 100 MG PO TABS: 100.0000 mg | ORAL_TABLET | Freq: Every day | ORAL | Status: DC

## 2020-05-10 MED ORDER — ONDANSETRON HCL 4 MG/2ML IJ SOLN: 4.0000 mg | Freq: Four times a day (QID) | INTRAMUSCULAR | Status: DC | PRN

## 2020-05-10 MED ORDER — METRONIDAZOLE 500 MG PO TABS
500.0000 mg | ORAL_TABLET | Freq: Three times a day (TID) | ORAL | Status: DC
  Administered 2020-05-10 – 2020-05-11 (×5): 500 mg via ORAL
  Filled 2020-05-10 (×5): qty 1

## 2020-05-10 MED ORDER — ACETAMINOPHEN 650 MG RE SUPP: 650.0000 mg | Freq: Four times a day (QID) | RECTAL | Status: DC | PRN

## 2020-05-10 NOTE — Plan of Care (Signed)

## 2020-05-10 NOTE — Progress Notes (Signed)
Derek Castro   DOB:[birth year]   FO#:[id number]   [id number]#:[id number]  Subjective:  [name] was evaluated at [facility] last night for fever and SOB. CT angio showed non pneumonia and no evidence of PE. Given his immunosuppressed status (see Assessment below) he was admitted for presumed sepsis and started on empiric antibiotics after cultures obtained. This AM he is very frustrated, feels service here is terrible, and hopes to go home no later than tomorrow.  COVID 19 VACCINATION STATUS: s/p Pfizer x2  Objective: white man sitting at bedside eating breakfast Vitals:   05/10/20 0432 05/10/20 0827  BP: (!) 157/74 (!) 161/71  Pulse: 70 71  Resp: (!) 22 20  Temp: 98.4 F (36.9 C) 98.9 F (37.2 C)  SpO2: 97% 98%    Body mass index is 37.31 kg/m.  Intake/Output Summary (Last 24 hours) at 05/10/2020 0935 Last data filed at 05/10/2020 0900 Gross per 24 hour  Intake 1943.14 ml  Output 2300 ml  Net -356.86 ml     CBG (last 3)  Recent Labs    05/10/20 0739  GLUCAP 90     Labs:  Lab Results  Component Value Date   WBC 3.9 (L) 05/10/2020   HGB 9.8 (L) 05/10/2020   HCT 31.1 (L) 05/10/2020   MCV 96.3 05/10/2020   PLT 112 (L) 05/10/2020   NEUTROABS 2.7 05/10/2020    _0 @  Urine Studies No results for input(s): UHGB, CRYS in the last 72 hours.  Invalid input(s): UACOL, UAPR, USPG, UPH, UTP, UGL, UKET, UBIL, UNIT, UROB, ULEU, UEPI, UWBC, URBC, UBAC, CAST, UCOM, BILUA  Basic Metabolic Panel: Recent Labs  Lab 05/09/20 1632 05/10/20 0453  NA 141 139  K 4.6 4.0  CL 105 105  CO2 26 25  GLUCOSE 106* 99  BUN 16 13  CREATININE 1.05 0.92  CALCIUM 9.0 8.5*   GFR Estimated Creatinine Clearance: 85.2 mL/min (by C-G formula based on SCr of 0.92 mg/dL). Liver Function Tests: Recent Labs  Lab 05/09/20 1632  AST 17  ALT 13  ALKPHOS 59  BILITOT 0.6  PROT 6.8  ALBUMIN 3.6   No results for input(s): LIPASE, AMYLASE in the last 168 hours. No results for  input(s): AMMONIA in the last 168 hours. Coagulation profile Recent Labs  Lab 05/09/20 1632  INR 1.0    CBC: Recent Labs  Lab 05/09/20 1632 05/10/20 0453  WBC 6.2 3.9*  NEUTROABS 4.7 2.7  HGB 10.2* 9.8*  HCT 32.0* 31.1*  MCV 93.0 96.3  PLT 133* 112*   Cardiac Enzymes: No results for input(s): CKTOTAL, CKMB, CKMBINDEX, TROPONINI in the last 168 hours. BNP: Invalid input(s): POCBNP CBG: Recent Labs  Lab 05/10/20 0739  GLUCAP 90   D-Dimer No results for input(s): DDIMER in the last 72 hours. Hgb A1c Recent Labs    05/10/20 0453  HGBA1C 5.9*   Lipid Profile No results for input(s): CHOL, HDL, LDLCALC, TRIG, CHOLHDL, LDLDIRECT in the last 72 hours. Thyroid function studies No results for input(s): TSH, T4TOTAL, T3FREE, THYROIDAB in the last 72 hours.  Invalid input(s): FREET3 Anemia work up No results for input(s): VITAMINB12, FOLATE, FERRITIN, TIBC, IRON, RETICCTPCT in the last 72 hours. Microbiology Recent Results (from the past 240 hour(s))  Resp Panel by RT-PCR (Flu A&B, Covid) Nasopharyngeal Swab     Status: None   Collection Time: 05/09/20  4:03 PM   Specimen: Nasopharyngeal Swab; Nasopharyngeal(NP) swabs in vial transport medium  Result Value Ref Range Status  SARS Coronavirus 2 by RT PCR NEGATIVE NEGATIVE Final    Comment: (NOTE) SARS-CoV-2 target nucleic acids are NOT DETECTED.  The SARS-CoV-2 RNA is generally detectable in upper respiratory specimens during the acute phase of infection. The lowest concentration of SARS-CoV-2 viral copies this assay can detect is 138 copies/mL. A negative result does not preclude SARS-Cov-2 infection and should not be used as the sole basis for treatment or other patient management decisions. A negative result may occur with  improper specimen collection/handling, submission of specimen other than nasopharyngeal swab, presence of viral mutation(s) within the areas targeted by this assay, and inadequate number of  viral copies(<138 copies/mL). A negative result must be combined with clinical observations, patient history, and epidemiological information. The expected result is Negative.  Fact Sheet for Patients:  EntrepreneurPulse.com.au  Fact Sheet for Healthcare Providers:  IncredibleEmployment.be  This test is no t yet approved or cleared by the [place] FDA and  has been authorized for detection and/or diagnosis of SARS-CoV-2 by FDA under an Emergency Use Authorization (EUA). This EUA will remain  in effect (meaning this test can be used) for the duration of the COVID-19 declaration under Section 564(b)(1) of the Act, 21 U.S.C.section 360bbb-3(b)(1), unless the authorization is terminated  or revoked sooner.       Influenza A by PCR NEGATIVE NEGATIVE Final   Influenza B by PCR NEGATIVE NEGATIVE Final    Comment: (NOTE) The Xpert Xpress SARS-CoV-2/FLU/RSV plus assay is intended as an aid in the diagnosis of influenza from Nasopharyngeal swab specimens and should not be used as a sole basis for treatment. Nasal washings and aspirates are unacceptable for Xpert Xpress SARS-CoV-2/FLU/RSV testing.  Fact Sheet for Patients: EntrepreneurPulse.com.au  Fact Sheet for Healthcare Providers: IncredibleEmployment.be  This test is not yet approved or cleared by the [place] FDA and has been authorized for detection and/or diagnosis of SARS-CoV-2 by FDA under an Emergency Use Authorization (EUA). This EUA will remain in effect (meaning this test can be used) for the duration of the COVID-19 declaration under Section 564(b)(1) of the Act, 21 U.S.C. section 360bbb-3(b)(1), unless the authorization is terminated or revoked.  Performed at [facility], [address] [place], [place] [address]   Blood Culture (routine x 2)     Status: None (Preliminary result)   Collection Time: 05/09/20  4:30 PM    Specimen: Left Antecubital; Blood  Result Value Ref Range Status   Specimen Description   Final    LEFT ANTECUBITAL BLOOD Performed at [facility], [place] [address], [place], [place] [address]    Special Requests   Final    BOTTLES DRAWN AEROBIC AND ANAEROBIC Blood Culture adequate volume Performed at [facility], [address] [place], [place] [address]    Culture PENDING  Incomplete   Report Status PENDING  Incomplete  Blood Culture (routine x 2)     Status: None (Preliminary result)   Collection Time: 05/09/20  4:45 PM   Specimen: Right Antecubital; Blood  Result Value Ref Range Status   Specimen Description   Final    RIGHT ANTECUBITAL BLOOD Performed at [facility], [place] [address] [place], [place] [address]    Special Requests   Final    BOTTLES DRAWN AEROBIC AND ANAEROBIC Blood Culture adequate volume Performed at [facility], [address] [place], [place] [address]    Culture PENDING  Incomplete   Report Status PENDING  Incomplete      Studies:  CT Angio Chest PE W/Cm &/Or Wo Cm  Result Date: 05/09/2020 CLINICAL DATA:  Fever, shortness of breath, chills. Multiple myeloma. Chemo radiation therapy. EXAM: CT ANGIOGRAPHY CHEST WITH CONTRAST TECHNIQUE: Multidetector CT imaging of the chest was performed using the standard protocol during bolus administration of intravenous contrast. Multiplanar CT image reconstructions and MIPs were obtained to evaluate the vascular anatomy. CONTRAST:  14m OMNIPAQUE IOHEXOL 350 MG/ML SOLN COMPARISON:  CT chest 01/18/2020. FINDINGS: Cardiovascular: Fairly satisfactory opacification of the pulmonary arteries to the segmental level. No evidence of pulmonary embolism. Normal heart size. No significant pericardial effusion. The thoracic aorta is normal in caliber. Mild to moderate calcified and noncalcified cyst atherosclerotic plaque of the thoracic aorta. At least 3 vessel mild coronary artery  calcifications. Mediastinum/Nodes: No enlarged mediastinal, hilar, or axillary lymph nodes. Thyroid gland, trachea, and esophagus demonstrate no significant findings. Lungs/Pleura: Interval increase in size of a 2.8 x 2.1 posteromedial left lower lobe nodule (from 2.3 x 1.9 cm). Associated calcification again noted. Otherwise no new pulmonary nodule. No pulmonary mass. No focal consolidation. No pleural effusion. No pneumothorax. Upper Abdomen: No acute abnormality. Musculoskeletal: Similar-appearing diffuse appendicular and axial skeleton. Similar-appearing lytic lesion with associated 1.3 cm soft tissue component along the anterior left second rib. Similar-appearing cortical irregularity and expansile lytic lesion of the right anterolateral fifth rib (6:49). Interval development of a subacute minimally displaced pathologic fracture through the left ninth posterior rib (6:68). Interval development of vertebral body height loss of the T5 vertebral body likely representing a pathologic compression fracture in the setting of lytic lesions consistent with known multiple myeloma. Interval development of a comminuted subacute fracture of the proximal left clavicle. Degenerative changes of bilateral shoulders. Review of the MIP images confirms the above findings. IMPRESSION: 1. No central or segmental pulmonary embolus. Unable to evaluate at the subsegmental level due to timing of contrast. 2. Interval increase in size of a 2.8 x 2.1 cm posteromedial left lower lobe nodule. 3. Interval development of a subacute, minimally displaced, pathologic fracture through the left posterior 9th rib. 4. Interval development of a subacute, comminuted, pathologic fracture of the proximal left clavicle 5. Interval development of an age-indeterminate pathologic compression fracture of the T5 vertebral body. Correlate with tenderness to palpation along the T5 level to evaluate for an acute component to the pathologic fracture. 6.  Similar-appearing diffuse axial and appendicular skeleton lytic lesions consistent with known multiple myeloma. Electronically Signed   By: MIven FinnM.D.   On: 05/09/2020 19:02   DG Chest Port 1 View  Result Date: 05/09/2020 CLINICAL DATA:  Questionable sepsis. Chills, body ache, fatigue and shortness of breath. Symptom onset after chemotherapy for multiple myeloma. EXAM: PORTABLE CHEST 1 VIEW COMPARISON:  Chest radiograph and CT 01/18/2020. PET CT 03/13/2020 FINDINGS: Linear atelectasis or scarring in the left lower lobe, unchanged from prior. Previous nodular opacity at the left lung base on CT is not well demonstrated by radiograph. Improved right basilar aeration. Stable cardiomegaly. Unchanged mediastinal contours. Possible tiny pleural effusions. No pneumothorax. No pulmonary edema. Lytic lesion in left second rib again seen. Peripherally sclerotic lesion involving the inferior aspect of the right scapula which was previously evaluated with PET. Periosteal reaction in the right proximal humerus at site of previous lucencies on PET. Callus formation about right fifth rib. Healing distal right clavicle fracture. Remote left clavicle fracture. Many of the bone lesions on prior PET-CT are not well resolved by radiograph. IMPRESSION: 1.  Unchanged cardiomegaly. Possible tiny pleural effusions. 2. No evidence of pneumonia. 3. Previous nodular opacity at the left lung base on CT is not well demonstrated by radiograph. 4. Bone lesions consistent with multiple myeloma, previously evaluated on PET CT. Electronically Signed   By: Keith Rake M.D.   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  (b) s/p maintenance  bortezomib to SEPT 2020  (c) s/p daratumumab SEPT 2020 to JUN 2021  (d) on carfilzomib/bortezomib, cyclophosphamide and dexamethasone JUN 2021 to current, next dose due 05/14/2020  (2) difficult crossmatch secondary to daratumumab treatment  (3) severely immunocompromised with IgG level 241 on 04/17/2020  (4) history of prostate cancer, Gleason 4+3, s/p prostatectomy 2013, s/p biochemical recurrence treated with XRT (68.4 Gy, 04/07/2016 to 06/02/2016)  (5) bladder cancer ([facility]rologic follow[facility]up at [facility])   Plan:  While patient's counts are adequate and he does not appear septic, he is severely immunocompromised. His total IgG was very low last month. He thinks he received IVIG within the past week--unfortunately I cannot confirm that through Epic. I will check his IgG level and give IVIG if still low.  Otherwise I concur with Dr Arman Filter plan for d/c to home next 24-48h on empiric antibiotics with final culture results pending (I will follow-up)  I suggested to Mr Pfalzgraf it might be helpful if he established himself with one of our hematologists (Dr Irene Limbo) in case he ends up back in our system in the future. We work with Dr Alvie Heidelberg at [facility] frequently. He is agreeable and I will place that referral.  Please let me know if I can be of further help.    Chauncey Cruel, MD 05/10/2020  9:35 AM Medical Oncology and Hematology [facility] [address] [place], [place] [address] Tel. [phone]    Fax. [phone]

## 2020-05-10 NOTE — H&P (Signed)
History and Physical    RAIQUAN CHANDLER ZHG:[id number] DOB: [birth year] DOA: 05/09/2020  PCP: Tammi Sou, MD  Patient coming from: Home.  I have personally briefly reviewed patient's old medical records in [place]  Chief Complaint: Fever.  HPI: Derek Castro is a 79 y.o. male with medical history significant of alcoholism in remission, bladder cancer, prostate cancer, depression, history of fall, hypertension, insomnia, multiple myeloma in relapse with recent chemotherapy/radiation therapy on 04/30/2020 [facility],, osteoarthritis, prediabetes who is coming from [facility] after presenting there with fever, chills, fatigue, malaise, myalgias, dyspnea and decreased appetite.  He denies productive cough, wheezing or hemoptysis.  No chest pain, palpitations, diaphoresis, PND, orthopnea or pitting edema of the lower extremities.  Denies abdominal pain, nausea, emesis, diarrhea, constipation, melena or hematochezia.  No dysuria, frequency or hematuria.  ED Course: Initial vital signs were temperature 101.3 F, pulse 82, respirations 22, blood pressure 167/69 mmHg and O2 sat 84 to 90% on room air.  O2 sat improved to 95% on 3 LPM.  The patient received cefepime, vancomycin and metronidazole.  No IVF boluses were given, however he was started on LR 150 mL x 20 hours.  Lab work: Urinalysis showed trace hemoglobin with a few bacteria on microscopic examination, was otherwise unremarkable.  CBC showed a white count of 6.2, hemoglobin 10.2 g/dL and platelets 133.  PT is 12.6, INR 1.0 PTT 27.  Lactic acid was normal.  CMP had a glucose of 106 mg/dL, but was otherwise normal.  Imaging: 1 view chest radiograph shows unchanged cardiomegaly with no evidence of pneumonia.  CTA chest did not show any PE.  There was interval increase from in size of the posteromedial left lower lobe nodule.  There is interval development of a subacute minimally displaced pathologic fracture  through the left posterior ninth rib.  Interval development of a subacute comminuted pathologic fracture of the left clavicle.  There is also compression fracture of T5.  There is similar-appearing diffuse axial and appendicular skeletal lytic lesions consistent with known multiple myeloma.  Please see images and full radiology report for further detail.  Review of Systems: As per HPI otherwise all other systems reviewed and are negative.  Past Medical History:  Diagnosis Date  . Alcoholism in remission ([place])    [facility] counseling three times per week as of 01/2017.  . Bladder cancer ([place]) 07/2019   07/10/2019 TURBT/gemcitabine instillation - LG, Ta, no muscle present  . Bone lesion   . Depression   . Fall 01/06/2018   pt fell wearing crocs while going up muddy embankment and hit head,no LOC went to ER no fractures pt sent home  . Family history of colon cancer in mother   . 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 Hyperlipidemia    Pt declined statin 08/2017  . Hypertension   . Insomnia    uses quetiapine for this  . Multiple myeloma ([place])    [facility]  . Osteoarthritis    C-spine--hx of fusion surgery.  R foot. Knees (hx of L TKA)  . Prediabetes   . Prostate cancer [facility])    [facility]    Past Surgical History:  Procedure Laterality Date  . ANTERIOR FUSION CERVICAL SPINE  2002  . autologous stem cell transplant  2010 and 2012   [facility] in [place].  Marland Kitchen COLONOSCOPY  2015   Recall 5  yrs ([facility] in [place], [place])  . CYSTOSCOPY  07/05/2019   for gross hematuria; noninvasive-appearing tumor found on cystoscopy->plan for TURBT.  Marland Kitchen EXCISIONAL TOTAL KNEE ARTHROPLASTY  2014 and 2016   Both knees  . LAPAROSCOPIC RETROPUBIC PROSTATECTOMY     + hx of radiation to pelvis.  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  Procedure: LEFT EXCISION MASS;   Surgeon: Leta Baptist, MD;  Location: [place];  Service: ENT;  Laterality: Left;  . SKIN FULL THICKNESS GRAFT Left 01/31/2018   Procedure: SKIN GRAFT FULL THICKNESS;  Surgeon: Leta Baptist, MD;  Location: [place];  Service: ENT;  Laterality: Left;  . TRANSURETHRAL RESECTION OF BLADDER TUMOR WITH GYRUS (TURBT-GYRUS)  07/10/2019   07/10/2019 TURBT/gemcitabine instillation - LG, Ta, no muscle present    Social History  reports that he quit smoking about 24 years ago. His smoking use included cigarettes. He has a 5.00 pack-year smoking history. He has never used smokeless tobacco. He reports that he does not drink alcohol and does not use drugs.  Allergies  Allergen Reactions  . Tamiflu [Oseltamivir] Nausea And Vomiting    Family History  Problem Relation Age of Onset  . Cancer Mother        colon   Prior to Admission medications   Medication Sig Start Date End Date Taking? Authorizing Provider  acyclovir (ZOVIRAX) 400 MG tablet TAKE ONE TABLET BY MOUTH DAILY 06/14/19  Yes McGowen, Adrian Blackwater, MD  amLODipine (NORVASC) 10 MG tablet TAKE ONE TABLET BY MOUTH EVERY DAY 04/06/20  Yes McGowen, Adrian Blackwater, MD  ibuprofen (ADVIL) 800 MG tablet Take 800 mg by mouth every 8 (eight) hours as needed. 07/10/19  Yes [provider]  lisinopril (ZESTRIL) 30 MG tablet Take 1 tablet (30 mg total) by mouth daily. 12/19/19  Yes McGowen, Adrian Blackwater, MD  ondansetron (ZOFRAN) 8 MG tablet TAKE 1 TABLET (8 MG TOTAL) BY MOUTH EVERY 8 (EIGHT) HOURS AS NEEDED FOR NAUSEA FOR UP TO 30 DOSES. 01/18/16  Yes [provider]  QUEtiapine (SEROQUEL) 100 MG tablet TAKE ONE TABLET BY MOUTH AT BEDTIME 07/31/19  Yes McGowen, Adrian Blackwater, MD  zolendronic acid (ZOMETA) 4 MG/5ML injection Inject into the vein.   Yes [provider]  zolpidem (AMBIEN) 10 MG tablet Take 10 mg by mouth at bedtime as needed. 03/20/19  Yes [provider]  cefdinir (OMNICEF) 300 MG capsule Take 1 capsule (300  mg total) by mouth 2 (two) times daily. Patient not taking: No sig reported 01/28/20   McGowen, Adrian Blackwater, MD  desonide (DESOWEN) 0.05 % ointment Apply 1 application topically as needed.  Patient not taking: Reported on 05/10/2020    [provider]  dexamethasone (DECADRON) 4 MG tablet Take 56m (2 x 468mtablets) by mouth once on day 22 of treatment cycles 1-9. Patient not taking: No sig reported 03/17/20   [provider]  polyethylene glycol powder (GLYCOLAX/MIRALAX) 17 GM/SCOOP powder Take by mouth. Patient not taking: No sig reported 01/31/20   [provider]    Physical Exam: Vitals:   05/09/20 2300 05/09/20 2301 05/10/20 0014 05/10/20 0432  BP: (!) 160/66  (!) 142/73 (!) 157/74  Pulse: 63 70 67 70  Resp: (!) 26 (!) 24 20 (!) 22  Temp:   98.9 F (37.2 C) 98.4 F (36.9 C)  TempSrc:   Oral Oral  SpO2: 91% 96% 95% 97%  Weight:  Height:       Constitutional: NAD, calm, comfortable Eyes: PERRL, lids and conjunctivae normal ENMT: Mucous membranes are moist. Posterior pharynx clear of any exudate or lesions. Neck: normal, supple, no masses, no thyromegaly Respiratory: clear to auscultation bilaterally, no wheezing, no crackles. Normal respiratory effort. No accessory muscle use.  Cardiovascular: Regular rate and rhythm, 2/6 SEM, no rubs / gallops. No extremity edema. 2+ pedal pulses. No carotid bruits.  Abdomen: Obese, no distention.  Bowel sounds positive.  Soft, no tenderness, no masses palpated. No hepatosplenomegaly. Bowel sounds positive.  Musculoskeletal: Mild generalized weakness.  No clubbing / cyanosis. Good ROM, no contractures. Normal muscle tone.  Skin: Right upper pretibial area and left lower pretibial area wounds with mild surrounding erythema. Neurologic: CN 2-12 grossly intact. Sensation intact, DTR normal. Strength 5/5 in all 4.  Psychiatric: Normal judgment and insight. Alert and oriented x 3. Normal mood.   Labs on Admission: I have  personally reviewed following labs and imaging studies  CBC: Recent Labs  Lab 05/09/20 1632  WBC 6.2  NEUTROABS 4.7  HGB 10.2*  HCT 32.0*  MCV 93.0  PLT 133*    Basic Metabolic Panel: Recent Labs  Lab 05/09/20 1632 05/10/20 0453  NA 141 139  K 4.6 4.0  CL 105 105  CO2 26 25  GLUCOSE 106* 99  BUN 16 13  CREATININE 1.05 0.92  CALCIUM 9.0 8.5*    GFR: Estimated Creatinine Clearance: 85.2 mL/min (by C-G formula based on SCr of 0.92 mg/dL).  Liver Function Tests: Recent Labs  Lab 05/09/20 1632  AST 17  ALT 13  ALKPHOS 59  BILITOT 0.6  PROT 6.8  ALBUMIN 3.6    Urine analysis:    Component Value Date/Time   COLORURINE YELLOW 05/09/2020 1632   APPEARANCEUR CLEAR 05/09/2020 1632   LABSPEC 1.010 05/09/2020 1632        PHURINE 6.5 05/09/2020 1632   GLUCOSEU NEGATIVE 05/09/2020 1632        HGBUR TRACE (A) 05/09/2020 1632   BILIRUBINUR NEGATIVE 05/09/2020 1632             KETONESUR NEGATIVE 05/09/2020 1632   PROTEINUR NEGATIVE 05/09/2020 1632             NITRITE NEGATIVE 05/09/2020 1632   LEUKOCYTESUR NEGATIVE 05/09/2020 1632         Radiological Exams on Admission: CT Angio Chest PE W/Cm &/Or Wo Cm  Result Date: 05/09/2020 CLINICAL DATA:  Fever, shortness of breath, chills. Multiple myeloma. Chemo radiation therapy. EXAM: CT ANGIOGRAPHY CHEST WITH CONTRAST TECHNIQUE: Multidetector CT imaging of the chest was performed using the standard protocol during bolus administration of intravenous contrast. Multiplanar CT image reconstructions and MIPs were obtained to evaluate the vascular anatomy. CONTRAST:  146m OMNIPAQUE IOHEXOL 350 MG/ML SOLN COMPARISON:  CT chest 01/18/2020. FINDINGS: Cardiovascular: Fairly satisfactory opacification of the pulmonary arteries to the segmental level. No evidence of pulmonary embolism. Normal heart size. No significant pericardial effusion. The thoracic aorta is normal in caliber. Mild to moderate calcified and noncalcified cyst  atherosclerotic plaque of the thoracic aorta. At least 3 vessel mild coronary artery calcifications. Mediastinum/Nodes: No enlarged mediastinal, hilar, or axillary lymph nodes. Thyroid gland, trachea, and esophagus demonstrate no significant findings. Lungs/Pleura: Interval increase in size of a 2.8 x 2.1 posteromedial left lower lobe nodule (from 2.3 x 1.9 cm). Associated calcification again noted. Otherwise no new pulmonary nodule. No pulmonary mass. No focal consolidation. No pleural effusion. No pneumothorax. Upper Abdomen: No acute  abnormality. Musculoskeletal: Similar-appearing diffuse appendicular and axial skeleton. Similar-appearing lytic lesion with associated 1.3 cm soft tissue component along the anterior left second rib. Similar-appearing cortical irregularity and expansile lytic lesion of the right anterolateral fifth rib (6:49). Interval development of a subacute minimally displaced pathologic fracture through the left ninth posterior rib (6:68). Interval development of vertebral body height loss of the T5 vertebral body likely representing a pathologic compression fracture in the setting of lytic lesions consistent with known multiple myeloma. Interval development of a comminuted subacute fracture of the proximal left clavicle. Degenerative changes of bilateral shoulders. Review of the MIP images confirms the above findings. IMPRESSION: 1. No central or segmental pulmonary embolus. Unable to evaluate at the subsegmental level due to timing of contrast. 2. Interval increase in size of a 2.8 x 2.1 cm posteromedial left lower lobe nodule. 3. Interval development of a subacute, minimally displaced, pathologic fracture through the left posterior 9th rib. 4. Interval development of a subacute, comminuted, pathologic fracture of the proximal left clavicle 5. Interval development of an age-indeterminate pathologic compression fracture of the T5 vertebral body. Correlate with tenderness to palpation along the  T5 level to evaluate for an acute component to the pathologic fracture. 6. Similar-appearing diffuse axial and appendicular skeleton lytic lesions consistent with known multiple myeloma. Electronically Signed   By: Iven Finn M.D.   On: 05/09/2020 19:02   DG Chest Port 1 View  Result Date: 05/09/2020 CLINICAL DATA:  Questionable sepsis. Chills, body ache, fatigue and shortness of breath. Symptom onset after chemotherapy for multiple myeloma. EXAM: PORTABLE CHEST 1 VIEW COMPARISON:  Chest radiograph and CT 01/18/2020. PET CT 03/13/2020 FINDINGS: Linear atelectasis or scarring in the left lower lobe, unchanged from prior. Previous nodular opacity at the left lung base on CT is not well demonstrated by radiograph. Improved right basilar aeration. Stable cardiomegaly. Unchanged mediastinal contours. Possible tiny pleural effusions. No pneumothorax. No pulmonary edema. Lytic lesion in left second rib again seen. Peripherally sclerotic lesion involving the inferior aspect of the right scapula which was previously evaluated with PET. Periosteal reaction in the right proximal humerus at site of previous lucencies on PET. Callus formation about right fifth rib. Healing distal right clavicle fracture. Remote left clavicle fracture. Many of the bone lesions on prior PET-CT are not well resolved by radiograph. IMPRESSION: 1. Unchanged cardiomegaly. Possible tiny pleural effusions. 2. No evidence of pneumonia. 3. Previous nodular opacity at the left lung base on CT is not well demonstrated by radiograph. 4. Bone lesions consistent with multiple myeloma, previously evaluated on PET CT. Electronically Signed   By: Keith Rake M.D.   On: 05/09/2020 17:03    EKG: Independently reviewed. Vent. rate 80 BPM PR interval 160 ms QRS duration 116 ms QT/QTc 384/442 ms P-R-T axes 46 -37 55 Normal sinus rhythm Left axis deviation Incomplete right bundle branch block Abnormal ECG  Assessment/Plan Principal  Problem:   SIRS (systemic inflammatory response syndrome) (HCC) Admit to telemetry/inpatient. ContinueLR for 20 hours. Continue cefepime per pharmacy per Continue vancomycin per pharmacy. Metronidazole 500 mg p.o. every 8 hours. Follow-up blood cultures and sensitivity. Immunosuppressed you MM. Oncology will evaluate her while hospitalized.  Active Problems:   Multiple myeloma in relapse ([place]) As above. To be evaluated by oncology on-call.    Depression Continue Seroquel 100 mg p.o. at bedtime.   Hypertension Continue amlodipine 10 mg p.o. daily. Continue lisinopril 30 mg p.o. daily. Monitor BP, renal function electrolytes.    Hyperlipidemia Not on statin. May  start here or needs to follow-up with PCP.    Aortic atherosclerosis (HCC) Statin is suggested.    CAD (coronary artery disease) Seen on chest CT scan. Follow-up with cardiology as an outpatient. Will benefit from hyperlipidemia treatment.    DVT prophylaxis: Lovenox SQ. Code Status:   Full code. Family Communication: Disposition Plan:   Patient is from:  Home.  Anticipated DC to:  Home.  Anticipated DC date:  05/11/2020.  Anticipated DC barriers: Clinical status.  Consults called:  Per EDP, Dr. Jana Hakim will see in consult. Admission status:  Inpatient/telemetry.  Severity of Illness:  High given age, comorbidities, multiple myeloma in relapse with recent chemotherapy presenting with fever and malaise needing IV antibiotic therapy.  Reubin Milan MD [facility]  How to contact the [facility] Attending or Consulting provider [place] or covering provider during after hours [place], for this patient?   1. Check the care team in [facility] and look for a) attending/consulting [facility] provider listed and b) the [facility] team listed 2. Log into [email or web address] and use [place]'s universal password to access. If you do not have the password, please contact the hospital operator. 3. Locate the [facility] provider you are looking  for under [facility] and page to a number that you can be directly reached. 4. If you still have difficulty reaching the provider, please page the [facility] (Director on Call) for the Hospitalists listed on amion for assistance.  05/10/2020, 5:28 AM   This document was prepared using Dragon voice recognition software and may contain some unintended transcription errors.

## 2020-05-10 NOTE — Progress Notes (Signed)
Patient seen and examined this morning, admitted overnight, H&P reviewed and agree with the assessment and plan.  Is a 79-year-old gentleman with multiple myeloma currently under chemotherapy who is here with febrile illness.  He was started on broad-spectrum antibiotics and cultures were sent.  His only complaint is flulike illness as well as a sore throat and raspy voice for the past 3 days, suggesting potentially simple pharyngitis caused by common virus.  He is Covid negative.  We will monitor on antibiotics, monitor cultures given relative immunosuppression, once afebrile for 24 hours could potentially go home   M. , MD, PhD [facility]  Between 7 am - 7 pm you can contact me via Amion or Securechat.  I am not available 7 pm - 7 am, please contact night coverage MD/APP via Amion  

## 2020-05-11 DIAGNOSIS — C9002 Multiple myeloma in relapse: Principal | ICD-10-CM

## 2020-05-11 DIAGNOSIS — I700 Atherosclerosis of aorta: Secondary | ICD-10-CM

## 2020-05-11 DIAGNOSIS — C9000 Multiple myeloma not having achieved remission: Secondary | ICD-10-CM

## 2020-05-11 LAB — CBC WITH DIFFERENTIAL/PLATELET
Abs Immature Granulocytes: 0.04 10*3/uL (ref 0.00–0.07)
Band Neutrophils: 2 %
Basophils Absolute: 0 10*3/uL (ref 0.0–0.1)
Basophils Relative: 0 %
Blasts: 0 %
Eosinophils Absolute: 0.2 10*3/uL (ref 0.0–0.5)
Eosinophils Relative: 4 %
HCT: 32.2 % — ABNORMAL LOW (ref 39.0–52.0)
Hemoglobin: 10.3 g/dL — ABNORMAL LOW (ref 13.0–17.0)
Lymphocytes Relative: 8 %
Lymphs Abs: 0.3 10*3/uL — ABNORMAL LOW (ref 0.7–4.0)
MCH: 30 pg (ref 26.0–34.0)
MCHC: 32 g/dL (ref 30.0–36.0)
MCV: 93.9 fL (ref 80.0–100.0)
Metamyelocytes Relative: 1 %
Monocytes Absolute: 0.6 10*3/uL (ref 0.1–1.0)
Monocytes Relative: 16 %
Myelocytes: 0 %
Neutro Abs: 2.9 10*3/uL (ref 1.7–7.7)
Neutrophils Relative %: 69 %
Other: 0 %
Platelets: 116 10*3/uL — ABNORMAL LOW (ref 150–400)
Promyelocytes Relative: 0 %
RBC: 3.43 MIL/uL — ABNORMAL LOW (ref 4.22–5.81)
RDW: 15.9 % — ABNORMAL HIGH (ref 11.5–15.5)
WBC: 4 10*3/uL (ref 4.0–10.5)
nRBC: 0 /100 WBC
nRBC: 0.8 % — ABNORMAL HIGH (ref 0.0–0.2)

## 2020-05-11 LAB — BASIC METABOLIC PANEL
Anion gap: 10 (ref 5–15)
BUN: 13 mg/dL (ref 8–23)
CO2: 25 mmol/L (ref 22–32)
Calcium: 9.1 mg/dL (ref 8.9–10.3)
Chloride: 104 mmol/L (ref 98–111)
Creatinine, Ser: 0.86 mg/dL (ref 0.61–1.24)
GFR, Estimated: >60 mL/min (ref >60)
Glucose, Bld: 104 mg/dL — ABNORMAL HIGH (ref 70–99)
Potassium: 3.9 mmol/L (ref 3.5–5.1)
Sodium: 139 mmol/L (ref 135–145)

## 2020-05-11 LAB — GLUCOSE, CAPILLARY: Glucose-Capillary: 94 mg/dL (ref 70–99)

## 2020-05-11 MED ORDER — AMOXICILLIN-POT CLAVULANATE 875-125 MG PO TABS: 1.0000 | ORAL_TABLET | Freq: Two times a day (BID) | ORAL | 0 refills | Status: AC

## 2020-05-11 NOTE — Discharge Summary (Signed)
Physician Discharge Summary  [name]:[id number] DOB: [birth year] DOA: 05/09/2020  PCP: Tammi Sou, MD  Admit date: 05/09/2020 Discharge date: 05/11/2020  Admitted From: home Disposition:  home  Recommendations for Outpatient Follow-up:  1. Follow up with PCP in 1-2 weeks 2. Follow up with Oncology as an outpatient  Home Health: none Equipment/Devices: none  Discharge Condition: stable CODE STATUS: Full code Diet recommendation: regular  HPI: Per admitting MD, Derek Castro is a 79 y.o. male with medical history significant of alcoholism in remission, bladder cancer, prostate cancer, depression, history of fall, hypertension, insomnia, multiple myeloma in relapse with recent chemotherapy/radiation therapy on 04/30/2020 [facility],, osteoarthritis, prediabetes who is coming from [facility] after presenting there with fever, chills, fatigue, malaise, myalgias, dyspnea and decreased appetite.  He denies productive cough, wheezing or hemoptysis.  No chest pain, palpitations, diaphoresis, PND, orthopnea or pitting edema of the lower extremities.  Denies abdominal pain, nausea, emesis, diarrhea, constipation, melena or hematochezia.  No dysuria, frequency or hematuria.  Hospital Course / Discharge diagnoses: Principal problem SIRS -patient was admitted to the hospital with SIRS criteria with fever, tachypnea and URI type symptoms.  Patient has been complaining, for 3 days prior to hospitalizations, a full sore throat and no change in his voice.  No shortness of breath, no cough or chest congestion.  His initial imaging was negative.  Due to immunosuppression/multiple myeloma/recent chemotherapy he was admitted to the hospital and placed on intravenous antibiotics.  His cultures remain negative, he is clinically improved, afebrile, feeling back to baseline and will be discharged home in stable condition.  He probably has a viral upper respiratory illness  with a sore throat and laryngitis.  Due to immunosuppression will cover with antibiotics for a few more days upon discharge.  He was advised to follow-up with PCP  Active problems Multiple myeloma-follow-up with oncology as an outpatient Depression-resume home medications on discharge CAD, aortic atherosclerosis-no cardiac symptoms, follow-up as an outpatient Essential hypertension-resume home medications  Discharge Instructions  Allergies as of 05/11/2020      Reactions   Tamiflu [oseltamivir] Nausea And Vomiting      Medication List    STOP taking these medications   cefdinir 300 MG capsule Commonly known as: OMNICEF   desonide 0.05 % ointment Commonly known as: DESOWEN   dexamethasone 4 MG tablet Commonly known as: DECADRON   polyethylene glycol powder 17 GM/SCOOP powder Commonly known as: GLYCOLAX/MIRALAX     TAKE these medications   acyclovir 400 MG tablet Commonly known as: ZOVIRAX TAKE ONE TABLET BY MOUTH DAILY   amLODipine 10 MG tablet Commonly known as: NORVASC TAKE ONE TABLET BY MOUTH EVERY DAY   amoxicillin-clavulanate 875-125 MG tablet Commonly known as: Augmentin Take 1 tablet by mouth every 12 (twelve) hours for 5 days.   ibuprofen 800 MG tablet Commonly known as: ADVIL Take 800 mg by mouth every 8 (eight) hours as needed.   lisinopril 30 MG tablet Commonly known as: ZESTRIL Take 1 tablet (30 mg total) by mouth daily.   ondansetron 8 MG tablet Commonly known as: ZOFRAN TAKE 1 TABLET (8 MG TOTAL) BY MOUTH EVERY 8 (EIGHT) HOURS AS NEEDED FOR NAUSEA FOR UP TO 30 DOSES.   QUEtiapine 100 MG tablet Commonly known as: SEROQUEL TAKE ONE TABLET BY MOUTH AT BEDTIME   zolendronic acid 4 MG/5ML injection Commonly known as: ZOMETA Inject into the vein.   zolpidem 10 MG tablet Commonly known as: AMBIEN Take  10 mg by mouth at bedtime as needed.       Follow-up Information    McGowen, Adrian Blackwater, MD. Schedule an appointment as soon as possible for a  visit in 1 week(s).   Specialty: Family Medicine Contact information: [id number] [place] [address] [phone]               Consultations:  Oncology   Procedures/Studies:  CT Angio Chest PE W/Cm &/Or Wo Cm  Result Date: 05/09/2020 CLINICAL DATA:  Fever, shortness of breath, chills. Multiple myeloma. Chemo radiation therapy. EXAM: CT ANGIOGRAPHY CHEST WITH CONTRAST TECHNIQUE: Multidetector CT imaging of the chest was performed using the standard protocol during bolus administration of intravenous contrast. Multiplanar CT image reconstructions and MIPs were obtained to evaluate the vascular anatomy. CONTRAST:  148m OMNIPAQUE IOHEXOL 350 MG/ML SOLN COMPARISON:  CT chest 01/18/2020. FINDINGS: Cardiovascular: Fairly satisfactory opacification of the pulmonary arteries to the segmental level. No evidence of pulmonary embolism. Normal heart size. No significant pericardial effusion. The thoracic aorta is normal in caliber. Mild to moderate calcified and noncalcified cyst atherosclerotic plaque of the thoracic aorta. At least 3 vessel mild coronary artery calcifications. Mediastinum/Nodes: No enlarged mediastinal, hilar, or axillary lymph nodes. Thyroid gland, trachea, and esophagus demonstrate no significant findings. Lungs/Pleura: Interval increase in size of a 2.8 x 2.1 posteromedial left lower lobe nodule (from 2.3 x 1.9 cm). Associated calcification again noted. Otherwise no new pulmonary nodule. No pulmonary mass. No focal consolidation. No pleural effusion. No pneumothorax. Upper Abdomen: No acute abnormality. Musculoskeletal: Similar-appearing diffuse appendicular and axial skeleton. Similar-appearing lytic lesion with associated 1.3 cm soft tissue component along the anterior left second rib. Similar-appearing cortical irregularity and expansile lytic lesion of the right anterolateral fifth rib (6:49). Interval development of a subacute minimally displaced pathologic fracture  through the left ninth posterior rib (6:68). Interval development of vertebral body height loss of the T5 vertebral body likely representing a pathologic compression fracture in the setting of lytic lesions consistent with known multiple myeloma. Interval development of a comminuted subacute fracture of the proximal left clavicle. Degenerative changes of bilateral shoulders. Review of the MIP images confirms the above findings. IMPRESSION: 1. No central or segmental pulmonary embolus. Unable to evaluate at the subsegmental level due to timing of contrast. 2. Interval increase in size of a 2.8 x 2.1 cm posteromedial left lower lobe nodule. 3. Interval development of a subacute, minimally displaced, pathologic fracture through the left posterior 9th rib. 4. Interval development of a subacute, comminuted, pathologic fracture of the proximal left clavicle 5. Interval development of an age-indeterminate pathologic compression fracture of the T5 vertebral body. Correlate with tenderness to palpation along the T5 level to evaluate for an acute component to the pathologic fracture. 6. Similar-appearing diffuse axial and appendicular skeleton lytic lesions consistent with known multiple myeloma. Electronically Signed   By: MIven FinnM.D.   On: 05/09/2020 19:02   DG Chest Port 1 View  Result Date: 05/09/2020 CLINICAL DATA:  Questionable sepsis. Chills, body ache, fatigue and shortness of breath. Symptom onset after chemotherapy for multiple myeloma. EXAM: PORTABLE CHEST 1 VIEW COMPARISON:  Chest radiograph and CT 01/18/2020. PET CT 03/13/2020 FINDINGS: Linear atelectasis or scarring in the left lower lobe, unchanged from prior. Previous nodular opacity at the left lung base on CT is not well demonstrated by radiograph. Improved right basilar aeration. Stable cardiomegaly. Unchanged mediastinal contours. Possible tiny pleural effusions. No pneumothorax. No pulmonary edema. Lytic lesion in left  second rib again seen.  Peripherally sclerotic lesion involving the inferior aspect of the right scapula which was previously evaluated with PET. Periosteal reaction in the right proximal humerus at site of previous lucencies on PET. Callus formation about right fifth rib. Healing distal right clavicle fracture. Remote left clavicle fracture. Many of the bone lesions on prior PET-CT are not well resolved by radiograph. IMPRESSION: 1. Unchanged cardiomegaly. Possible tiny pleural effusions. 2. No evidence of pneumonia. 3. Previous nodular opacity at the left lung base on CT is not well demonstrated by radiograph. 4. Bone lesions consistent with multiple myeloma, previously evaluated on PET CT. Electronically Signed   By: Keith Rake M.D.   On: 05/09/2020 17:03      Subjective: - no chest pain, shortness of breath, no abdominal pain, nausea or vomiting.   Discharge Exam: BP (!) 150/79 (BP Location: Right Arm)   Pulse 69   Temp 99.3 F (37.4 C) (Oral)   Resp 18   Ht '5\' 10"'  (1.778 m)   Wt 117.9 kg   SpO2 100%   BMI 37.31 kg/m   General: Pt is alert, awake, not in acute distress Cardiovascular: RRR, S1/S2 +, no rubs, no gallops Respiratory: CTA bilaterally, no wheezing, no rhonchi Abdominal: Soft, NT, ND, bowel sounds + Extremities: no edema, no cyanosis    The results of significant diagnostics from this hospitalization (including imaging, microbiology, ancillary and laboratory) are listed below for reference.     Microbiology: Recent Results (from the past 240 hour(s))  Resp Panel by RT-PCR (Flu A&B, Covid) Nasopharyngeal Swab     Status: None   Collection Time: 05/09/20  4:03 PM   Specimen: Nasopharyngeal Swab; Nasopharyngeal(NP) swabs in vial transport medium  Result Value Ref Range Status   SARS Coronavirus 2 by RT PCR NEGATIVE NEGATIVE Final    Comment: (NOTE) SARS-CoV-2 target nucleic acids are NOT DETECTED.  The SARS-CoV-2 RNA is generally detectable in upper respiratory specimens during  the acute phase of infection. The lowest concentration of SARS-CoV-2 viral copies this assay can detect is 138 copies/mL. A negative result does not preclude SARS-Cov-2 infection and should not be used as the sole basis for treatment or other patient management decisions. A negative result may occur with  improper specimen collection/handling, submission of specimen other than nasopharyngeal swab, presence of viral mutation(s) within the areas targeted by this assay, and inadequate number of viral copies(<138 copies/mL). A negative result must be combined with clinical observations, patient history, and epidemiological information. The expected result is Negative.  Fact Sheet for Patients:  EntrepreneurPulse.com.au  Fact Sheet for Healthcare Providers:  IncredibleEmployment.be  This test is no t yet approved or cleared by the [place] FDA and  has been authorized for detection and/or diagnosis of SARS-CoV-2 by FDA under an Emergency Use Authorization (EUA). This EUA will remain  in effect (meaning this test can be used) for the duration of the COVID-19 declaration under Section 564(b)(1) of the Act, 21 U.S.C.section 360bbb-3(b)(1), unless the authorization is terminated  or revoked sooner.       Influenza A by PCR NEGATIVE NEGATIVE Final   Influenza B by PCR NEGATIVE NEGATIVE Final    Comment: (NOTE) The Xpert Xpress SARS-CoV-2/FLU/RSV plus assay is intended as an aid in the diagnosis of influenza from Nasopharyngeal swab specimens and should not be used as a sole basis for treatment. Nasal washings and aspirates are unacceptable for Xpert Xpress SARS-CoV-2/FLU/RSV testing.  Fact Sheet for Patients: EntrepreneurPulse.com.au  Fact Sheet for  Healthcare Providers: IncredibleEmployment.be  This test is not yet approved or cleared by the [place] and has been authorized for detection and/or  diagnosis of SARS-CoV-2 by FDA under an Emergency Use Authorization (EUA). This EUA will remain in effect (meaning this test can be used) for the duration of the COVID-19 declaration under Section 564(b)(1) of the Act, 21 U.S.C. section 360bbb-3(b)(1), unless the authorization is terminated or revoked.  Performed at [facility], [address] [place], [place] [address]   Blood Culture (routine x 2)     Status: None (Preliminary result)   Collection Time: 05/09/20  4:30 PM   Specimen: Left Antecubital; Blood  Result Value Ref Range Status   Specimen Description   Final    LEFT ANTECUBITAL BLOOD Performed at [facility], [place] [address], [place], [place] [address]    Special Requests   Final    BOTTLES DRAWN AEROBIC AND ANAEROBIC Blood Culture adequate volume Performed at [facility], [address] [place], [place] [address]    Culture   Final    NO GROWTH < 12 HOURS Performed at [facility] [address], [place], [place] [address]    Report Status PENDING  Incomplete  Blood Culture (routine x 2)     Status: None (Preliminary result)   Collection Time: 05/09/20  4:45 PM   Specimen: Right Antecubital; Blood  Result Value Ref Range Status   Specimen Description   Final    RIGHT ANTECUBITAL BLOOD Performed at [facility], [place] [address], [place], [place] [address]    Special Requests   Final    BOTTLES DRAWN AEROBIC AND ANAEROBIC Blood Culture adequate volume Performed at [facility], [address] [place], [place] [address]    Culture   Final    NO GROWTH < 12 HOURS Performed at [facility] [address], [place], [place] [address]    Report Status PENDING  Incomplete     Labs: Basic Metabolic Panel: Recent Labs  Lab 05/09/20 1632 05/10/20 0453 05/11/20 0437  NA 141 139 139  K 4.6 4.0 3.9  CL 105 105 104  CO2 '26 25 25  ' GLUCOSE 106* 99 104*  BUN '16 13 13  ' CREATININE 1.05 0.92 0.86  CALCIUM  9.0 8.5* 9.1   Liver Function Tests: Recent Labs  Lab 05/09/20 1632  AST 17  ALT 13  ALKPHOS 59  BILITOT 0.6  PROT 6.8  ALBUMIN 3.6   CBC: Recent Labs  Lab 05/09/20 1632 05/10/20 0453 05/11/20 0437  WBC 6.2 3.9* 4.0  NEUTROABS 4.7 2.7 2.9  HGB 10.2* 9.8* 10.3*  HCT 32.0* 31.1* 32.2*  MCV 93.0 96.3 93.9  PLT 133* 112* 116*   CBG: Recent Labs  Lab 05/10/20 0739 05/10/20 1208 05/10/20 1625 05/10/20 1952  GLUCAP 90 94 120* 115*   Hgb A1c Recent Labs    05/10/20 0453  HGBA1C 5.9*   Lipid Profile No results for input(s): CHOL, HDL, LDLCALC, TRIG, CHOLHDL, LDLDIRECT in the last 72 hours. Thyroid function studies No results for input(s): TSH, T4TOTAL, T3FREE, THYROIDAB in the last 72 hours.  Invalid input(s): FREET3 Urinalysis    Component Value Date/Time   COLORURINE YELLOW 05/09/2020 [place] 05/09/2020 1632   LABSPEC 1.010 05/09/2020 1632   LABSPEC 1.010 06/02/2016 1150   PHURINE 6.5 05/09/2020 1632   GLUCOSEU NEGATIVE 05/09/2020 1632   GLUCOSEU Negative  06/02/2016 1150   HGBUR TRACE (A) 05/09/2020 1632   BILIRUBINUR NEGATIVE 05/09/2020 1632   BILIRUBINUR negative 06/20/2019 1122   BILIRUBINUR Negative 06/02/2016 [place] 05/09/2020 1632   PROTEINUR NEGATIVE 05/09/2020 1632   UROBILINOGEN 0.2 06/20/2019 1122   UROBILINOGEN 0.2 06/02/2016 1150   NITRITE NEGATIVE 05/09/2020 1632   LEUKOCYTESUR NEGATIVE 05/09/2020 1632   LEUKOCYTESUR Negative 06/02/2016 1150    FURTHER DISCHARGE INSTRUCTIONS:   Get Medicines reviewed and adjusted: Please take all your medications with you for your next visit with your Primary MD   Laboratory/radiological data: Please request your Primary MD to go over all hospital tests and procedure/radiological results at the follow up, please ask your Primary MD to get all Hospital records sent to his/her office.   In some cases, they will be blood work, cultures and biopsy results pending at  the time of your discharge. Please request that your primary care M.D. goes through all the records of your hospital data and follows up on these results.   Also Note the following: If you experience worsening of your admission symptoms, develop shortness of breath, life threatening emergency, suicidal or homicidal thoughts you must seek medical attention immediately by calling 911 or calling your MD immediately  if symptoms less severe.   You must read complete instructions/literature along with all the possible adverse reactions/side effects for all the Medicines you take and that have been prescribed to you. Take any new Medicines after you have completely understood and accpet all the possible adverse reactions/side effects.    Do not drive when taking Pain medications or sleeping medications (Benzodaizepines)   Do not take more than prescribed Pain, Sleep and Anxiety Medications. It is not advisable to combine anxiety,sleep and pain medications without talking with your primary care practitioner   Special Instructions: If you have smoked or chewed Tobacco  in the last 2 yrs please stop smoking, stop any regular Alcohol  and or any Recreational drug use.   Wear Seat belts while driving.   Please note: You were cared for by a hospitalist during your hospital stay. Once you are discharged, your primary care physician will handle any further medical issues. Please note that NO REFILLS for any discharge medications will be authorized once you are discharged, as it is imperative that you return to your primary care physician (or establish a relationship with a primary care physician if you do not have one) for your post hospital discharge needs so that they can reassess your need for medications and monitor your lab values.  Time coordinating discharge: 35 minutes  SIGNED:  Marzetta Board, MD, PhD 05/11/2020, 7:49 AM

## 2020-05-11 NOTE — Plan of Care (Signed)
Patient discharged home, accompanied by wife.  Education material printed and reviewed with patient.  Patient understands discharge instructions and medications.  VSS (SpO2 95% on RA while moving around room).  Patient left with all belongings.  Patient has no further concerns.     Problem: Education: Goal: Knowledge of General Education information will improve Description: Including pain rating scale, medication(s)/side effects and non-pharmacologic comfort measures Outcome: Adequate for Discharge   Problem: Health Behavior/Discharge Planning: Goal: Ability to manage health-related needs will improve Outcome: Adequate for Discharge   Problem: Clinical Measurements: Goal: Ability to maintain clinical measurements within normal limits will improve Outcome: Adequate for Discharge Goal: Will remain free from infection Outcome: Adequate for Discharge Goal: Diagnostic test results will improve Outcome: Adequate for Discharge Goal: Respiratory complications will improve Outcome: Adequate for Discharge Goal: Cardiovascular complication will be avoided Outcome: Adequate for Discharge   Problem: Activity: Goal: Risk for activity intolerance will decrease Outcome: Adequate for Discharge   Problem: Nutrition: Goal: Adequate nutrition will be maintained Outcome: Adequate for Discharge   Problem: Coping: Goal: Level of anxiety will decrease Outcome: Adequate for Discharge   Problem: Elimination: Goal: Will not experience complications related to bowel motility Outcome: Adequate for Discharge Goal: Will not experience complications related to urinary retention Outcome: Adequate for Discharge   Problem: Pain Managment: Goal: General experience of comfort will improve Outcome: Adequate for Discharge   Problem: Safety: Goal: Ability to remain free from injury will improve Outcome: Adequate for Discharge   Problem: Skin Integrity: Goal: Risk for impaired skin integrity will  decrease Outcome: Adequate for Discharge

## 2020-05-12 ENCOUNTER — Telehealth: Payer: Self-pay

## 2020-05-12 LAB — IGG, IGA, IGM
IgA: 285 mg/dL (ref 61–437)
IgG (Immunoglobin G), Serum: 710 mg/dL (ref 603–1613)
IgM (Immunoglobulin M), Srm: 8 mg/dL — ABNORMAL LOW (ref 15–143)

## 2020-05-12 NOTE — Telephone Encounter (Signed)
Transition Care Management Follow-up Telephone Call  Date of discharge and from where: 05/11/20-[place]  How have you been since you were released from the hospital? Good  Any questions or concerns? No  Items Reviewed:  Did the pt receive and understand the discharge instructions provided? Yes   Medications obtained and verified? Yes   Other? Yes   Any new allergies since your discharge? No but patient says he has a rash that itches on his chest. He will call the office back if no better in a couple of days.  Dietary orders reviewed? Yes  Do you have support at home? Yes   Home Care and Equipment/Supplies: Were home health services ordered? no If so, what is the name of the agency? n/a  Has the agency set up a time to come to the patient's home? not applicable Were any new equipment or medical supplies ordered?  No What is the name of the medical supply agency? n/a Were you able to get the supplies/equipment? not applicable Do you have any questions related to the use of the equipment or supplies? N/A  Functional Questionnaire: (I = Independent and D = Dependent) ADLs: I  Bathing/Dressing- I  Meal Prep- I  Eating- I  Maintaining continence- I  Transferring/Ambulation- I  Managing Meds- I  Follow up appointments reviewed:   PCP Hospital f/u appt confirmed? Yes  Scheduled to see Dr. Anitra Lauth on 05/28/20 @ 11:00.  [facility] f/u appt confirmed? No  Patient to schedule visit with oncology  Are transportation arrangements needed? No   If their condition worsens, is the pt aware to call PCP or go to the Emergency Dept.? Yes  Was the patient provided with contact information for the PCP's office or ED? Yes  Was to pt encouraged to call back with questions or concerns? Yes

## 2020-05-12 NOTE — Telephone Encounter (Signed)
1st atttempt TCM call. No answer.

## 2020-05-13 NOTE — Telephone Encounter (Signed)
OK. Noted: nurse phone contact with patient for TCM. Signed:  Crissie Sickles, MD           05/13/2020

## 2020-05-14 ENCOUNTER — Other Ambulatory Visit: Payer: Self-pay

## 2020-05-14 ENCOUNTER — Other Ambulatory Visit: Payer: Self-pay | Admitting: *Deleted

## 2020-05-14 LAB — URINE CULTURE: Culture: 3000 — AB

## 2020-05-14 NOTE — Patient Outreach (Signed)
[facility]) Care Management  05/14/2020  Derek Castro 1940/09/21 [id number]   EMMI-GENERAL DISCHARGE-SUCCESSFUL RED ON EMMI ALERT Day #1 Date:05/13/2020 Red Alert Reason: QUESTIONS/CONCERNS  OUTREACH #1 RN spoke with pt concerning the above emmi. Pt has already spoken with his provider's office for a follow up appointment. Pt mentioned a rash on his chest (new). RN strongly encouraged pt to contact his provider's office in case this is an allergic reaction. Also informed pt if acute or symptoms get worse to seek immediate attention at a local urgent care center. Pt continue to have issues related to the care he received at the hospital concerning the "food" and individualized care received by "foreigners" who he could not understanding. RN encouraged pt to contact the hospital's  Patient relations team with his concerns who could better address those issues. Pt aware he can call directly to the hospital with the request to the patient relations department for further assistance on this matter.    No further needs at this time. Will close this case.  Raina Mina, RN Care Management Coordinator [place] Office [phone]

## 2020-05-15 ENCOUNTER — Encounter: Payer: Self-pay | Admitting: Family Medicine

## 2020-05-15 ENCOUNTER — Ambulatory Visit (INDEPENDENT_AMBULATORY_CARE_PROVIDER_SITE_OTHER): Payer: Medicare Other | Admitting: Family Medicine

## 2020-05-15 ENCOUNTER — Ambulatory Visit: Payer: Medicare Other | Admitting: Family Medicine

## 2020-05-15 VITALS — BP 111/57 | HR 63 | Temp 98.1°F | Resp 16 | Ht 70.0 in | Wt 263.6 lb

## 2020-05-15 DIAGNOSIS — R21 Rash and other nonspecific skin eruption: Secondary | ICD-10-CM

## 2020-05-15 DIAGNOSIS — R0902 Hypoxemia: Secondary | ICD-10-CM

## 2020-05-15 DIAGNOSIS — R5081 Fever presenting with conditions classified elsewhere: Secondary | ICD-10-CM

## 2020-05-15 DIAGNOSIS — B349 Viral infection, unspecified: Secondary | ICD-10-CM

## 2020-05-15 DIAGNOSIS — R6510 Systemic inflammatory response syndrome (SIRS) of non-infectious origin without acute organ dysfunction: Secondary | ICD-10-CM

## 2020-05-15 LAB — CULTURE, BLOOD (ROUTINE X 2)
Culture: NO GROWTH
Culture: NO GROWTH
Special Requests: ADEQUATE
Special Requests: ADEQUATE

## 2020-05-15 MED ORDER — FLUTICASONE PROPIONATE 0.05 % EX CREA: TOPICAL_CREAM | Freq: Two times a day (BID) | CUTANEOUS | 1 refills | Status: AC

## 2020-05-15 NOTE — Progress Notes (Signed)
05/15/2020  CC:  Chief Complaint  Patient presents with  . Hospitalization Follow-up    12/11-12/13    Patient is a 79 y.o. Caucasian male who presents accompanied by his wife for hospital follow up, specifically [facility] Services face-to-face visit PLUS acute problem of rash. Dates hospitalized: 12/11-12/13, 2021. Days since d/c from hospital: 4 days Patient was discharged from hospital to home. Reason for admission to hospital: fever in the context of immunosuppression/chemotherapy treatment->SIRS. Date of interactive (phone) contact with patient and/or caregiver:05/12/20.  I have reviewed patient's discharge summary plus pertinent specific notes, labs, and imaging from the hospitalization.   URI type sx's as well as fever and hypoxia upon admission.  SOB/hypoxia apparently not ongoing in hosp.  WBC normal, blood clx's neg, treated with broad spectrum abx, urine clx (12/11) did grow small amount of proteus and aerococcus, sensitive to all abx tested except for nitrofurantoin.  His UA with microscopy was NORMAL.  He improved signif. D/C'd home with plan of taking 5 days of augmentin.  He is feeling fine now, no sob/cough/fever/ST/body aches or n/v/d.  Taking all meds as rx'd. While in hosp he says he developed an itchy rash on back and chest.  Thinks it may have progressed a little since going home.  Applied cortisone 10. No oral lesions, no tongue swelling, no swelling of eyes or lips.  Most recent chemo tx at [facility] was a couple days prior to his recent admission.  Medication reconciliation was done today and patient is taking meds as recommended by discharging hospitalist/specialist.    PMH:  Past Medical History:  Diagnosis Date  . Alcoholism in remission ([place])    [facility] counseling three times per week as of 01/2017.  . Bladder cancer ([place]) 07/2019   07/10/2019 TURBT/gemcitabine instillation - LG, Ta, no muscle present  . Bone lesion   . Depression   . Fall  01/06/2018   pt fell wearing crocs while going up muddy embankment and hit head,no LOC went to ER no fractures pt sent home  . Family history of colon cancer in mother   . 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 Hyperlipidemia    Pt declined statin 08/2017  . Hypertension   . Insomnia    uses quetiapine for this  . Multiple myeloma ([place])    [facility]  . Osteoarthritis    C-spine--hx of fusion surgery.  R foot. Knees (hx of L TKA)  . Prediabetes   . Prostate cancer [facility])    [facility]    [place]:  Past Surgical History:  Procedure Laterality Date  . ANTERIOR FUSION CERVICAL SPINE  2002  . autologous stem cell transplant  2010 and 2012   [facility] in [place].  Marland Kitchen COLONOSCOPY  2015   Recall 5 yrs ([facility] in [place], [place])  . CYSTOSCOPY  07/05/2019   for gross hematuria; noninvasive-appearing tumor found on cystoscopy->plan for TURBT.  Marland Kitchen EXCISIONAL TOTAL KNEE ARTHROPLASTY  2014 and 2016   Both knees  . LAPAROSCOPIC RETROPUBIC PROSTATECTOMY     + hx of radiation to pelvis.  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  Procedure: LEFT EXCISION MASS;  Surgeon: Leta Baptist, MD;  Location: [place];  Service: ENT;  Laterality: Left;  . SKIN FULL THICKNESS GRAFT Left 01/31/2018   Procedure: SKIN GRAFT FULL THICKNESS;  Surgeon: Leta Baptist, MD;  Location: [place];  Service: ENT;  Laterality: Left;  . TRANSURETHRAL RESECTION OF BLADDER TUMOR WITH GYRUS (TURBT-GYRUS)  07/10/2019   07/10/2019 TURBT/gemcitabine instillation - LG, Ta, no muscle present    MEDS:  Outpatient Medications Prior to Visit  Medication Sig Dispense Refill  . acyclovir (ZOVIRAX) 400 MG tablet TAKE ONE TABLET BY MOUTH DAILY 30 tablet 6  . amLODipine (NORVASC) 10 MG tablet TAKE ONE TABLET BY MOUTH EVERY DAY 90 tablet 0  . amoxicillin-clavulanate (AUGMENTIN)  875-125 MG tablet Take 1 tablet by mouth every 12 (twelve) hours for 5 days. 10 tablet 0  . desonide (DESOWEN) 0.05 % ointment Apply topically.    Marland Kitchen ibuprofen (ADVIL) 800 MG tablet Take 800 mg by mouth every 8 (eight) hours as needed.    Marland Kitchen lisinopril (ZESTRIL) 30 MG tablet Take 1 tablet (30 mg total) by mouth daily. 90 tablet 3  . ondansetron (ZOFRAN) 8 MG tablet TAKE 1 TABLET (8 MG TOTAL) BY MOUTH EVERY 8 (EIGHT) HOURS AS NEEDED FOR NAUSEA FOR UP TO 30 DOSES.  3  . pantoprazole (PROTONIX) 40 MG tablet Take 40 mg by mouth daily.    . QUEtiapine (SEROQUEL) 100 MG tablet TAKE ONE TABLET BY MOUTH AT BEDTIME 90 tablet 3  . sertraline (ZOLOFT) 50 MG tablet Take 50 mg by mouth daily.    Marland Kitchen zolpidem (AMBIEN) 10 MG tablet Take 10 mg by mouth at bedtime as needed.    . zolendronic acid (ZOMETA) 4 MG/5ML injection Inject into the vein. (Patient not taking: Reported on 05/15/2020)     No facility-administered medications prior to visit.  EXAM:  Vitals with BMI 05/15/2020 05/11/2020 05/10/2020  Height '5\' 10"'  - -  Weight 263 lbs 10 oz - -  BMI 17.40 - -  Systolic [phone]  Diastolic 57 79 77  Pulse 63 69 70  Gen: Alert, well appearing.  Patient is oriented to person, place, time, and situation. AFFECT: pleasant, lucid thought and speech. CV: RRR, 1-2/6 syst murmur, no diastolic murmur, no r/g.   LUNGS: CTA bilat, nonlabored resps, good aeration in all lung fields. EXT: no clubbing or cyanosis.  no edema.  SKIN: scattered pinkish macular slightly scaling rash, mildly blanching--covering upper chest, tops of shoulders, upper back.  No vesicles, no pustules, no ulcerations, no hives.  Pertinent labs/imaging   Chemistry      Component Value Date/Time   NA 139 05/11/2020 0437   NA 135 (A) 06/12/2019 0000   K 3.9 05/11/2020 0437   CL 104 05/11/2020 0437   CO2 25 05/11/2020 0437   BUN 13 05/11/2020 0437   BUN 27 (A) 06/12/2019 0000   CREATININE 0.86 05/11/2020 0437   GLU 93 06/12/2019 0000       Component Value Date/Time   CALCIUM 9.1 05/11/2020 0437   ALKPHOS 59 05/09/2020 1632   AST 17 05/09/2020 1632   ALT 13 05/09/2020 1632   BILITOT 0.6 05/09/2020 1632     Lab Results  Component Value Date   WBC 4.0 05/11/2020   HGB 10.3 (L) 05/11/2020   HCT 32.2 (L) 05/11/2020   MCV 93.9 05/11/2020   PLT 116 (L) 05/11/2020   CT angio chest for PE w w/out on 05/09/20: IMPRESSION: 1. No central or segmental pulmonary embolus. Unable to evaluate at the subsegmental level due to timing of contrast. 2. Interval increase in size of a 2.8 x 2.1 cm posteromedial left lower lobe nodule. 3. Interval development of a subacute, minimally  displaced, pathologic fracture through the left posterior 9th rib. 4. Interval development of a subacute, comminuted, pathologic fracture of the proximal left clavicle 5. Interval development of an age-indeterminate pathologic compression fracture of the T5 vertebral body. Correlate with tenderness to palpation along the T5 level to evaluate for an acute component to the pathologic fracture. 6. Similar-appearing diffuse axial and appendicular skeleton lytic lesions consistent with known multiple myeloma.   ASSESSMENT/PLAN:  1) Eczematous rash, unclear etiology. Does not appear like typical drug rash. Cutivate 0.05% cream bid to affected area.  2) Fever, URI, hypoxia->SIRS-->resolved in about 24h in hosp, no clear etiology detected. Blood clx neg.  CXR fine, CT chest neg for PE or pneumonia or pulm edema. This was in the setting of getting chemo for multiple myeloma. Finish augmentin.  3) Multiple myeloma: he'll be continuing treatment for this, next f/u with [facility] hem onc 05/26/20.  Medical decision making of moderate complexity was utilized today.  FOLLOW UP:  If not improving in 4-5 d  Signed:  Crissie Sickles, MD           05/15/2020

## 2020-05-18 ENCOUNTER — Encounter: Payer: Self-pay | Admitting: *Deleted

## 2020-05-18 ENCOUNTER — Other Ambulatory Visit: Payer: Self-pay | Admitting: *Deleted

## 2020-05-18 NOTE — Patient Outreach (Signed)
[facility]) Care Management  05/18/2020  Derek Castro Sep 17, 1940 [id number]   EMMI-GENERAL DISCHARGE-UNSUCCESSFUL  RED ON EMMI ALERT Day #4 Date:05/16/2020 Red Alert Reason: LOST INTEREST/SADNESS/HOPELESS, QUESTIONS AND CONCERNS  OUTREACH #2 RN received a call back from pt from earlier today and RN attempted to return the call however the callback was unsuccessful. RN left another HIPAA approved voice message. RN left a message offering to call back at a spectacular time or day if indicated on a return call to the voice mail.  Also left message on the next call back RN is scheduled to call.   Will attempt another outreach call over the next week. Will further inquire at that time.   Raina Mina, RN Care Management Coordinator [place] Office [phone]

## 2020-05-18 NOTE — Patient Outreach (Signed)
[facility]) Care Management  05/18/2020  RAYSEAN GRAUMANN [birth year] [id number]   EMMI-GENERAL DISCHARGE-UNSUCCESSFUL RED ON EMMI ALERT Day #4 Date:05/16/2020 Red Alert Reason: LOST OF INTEREST/SAD/HOPELESS, QUESTIONS AND CONCERNS.  OUTREACH#1 RN attempted outreach call today however unsuccessful. RN able to leave a HIPAA approved voice message requesting a call back.   Will attempted another outreach over the next week.  Raina Mina, RN Care Management Coordinator [place] Office [phone]

## 2020-05-25 ENCOUNTER — Encounter: Payer: Self-pay | Admitting: *Deleted

## 2020-05-25 ENCOUNTER — Other Ambulatory Visit: Payer: Self-pay | Admitting: *Deleted

## 2020-05-25 NOTE — Patient Outreach (Signed)
[facility]) Care Management  05/25/2020  Derek Castro May 12, 1941 [id number]   EMMI-GENERAL DISCHARGE-UNSUCCESSFUL RED ON EMMI ALERT Day #4 Date:05/16/2020  Red Alert Reason: LOST OF INTEREST/SAD/HOPELESS, QUESTIONS AND CONCERNS  OUTREACH #3 Attempted to call however unsuccessful. RN able to leave a HIPAA approved message requesting a call back. RN will attempted another outreach over the next few weeks per protocol via workflow.  Raina Mina, RN Care Management Coordinator [place] Office [phone]

## 2020-05-26 ENCOUNTER — Other Ambulatory Visit: Payer: Self-pay | Admitting: *Deleted

## 2020-05-26 DIAGNOSIS — R454 Irritability and anger: Secondary | ICD-10-CM | POA: Diagnosis not present

## 2020-05-26 DIAGNOSIS — F4320 Adjustment disorder, unspecified: Secondary | ICD-10-CM | POA: Diagnosis not present

## 2020-05-26 NOTE — Patient Outreach (Signed)
[facility]) Care Management  05/26/2020  Derek Castro 1941-05-18 [id number]   EMMI-GENERAL DISCHARGE-SUCCESSFUL RED ON EMMI ALERT Day #4 Date:05/16/2020 Red Alert Reason: LOST OF INTEREST/SAD/HOPELESS, QUESTION/CONCERNS  OUTREACH#1 RN spoke with pt today concerning the EMMI above. Pt denied any of the above emmi with no additional questions or concerns. Verified pt remains adherent with medications and all his appointments.   Will close with no additional needs.  Elliot Cousin, RN Care Management Coordinator [facility] Main Office [phone]

## 2020-05-27 DIAGNOSIS — C9000 Multiple myeloma not having achieved remission: Secondary | ICD-10-CM | POA: Diagnosis not present

## 2020-05-27 DIAGNOSIS — Z5111 Encounter for antineoplastic chemotherapy: Secondary | ICD-10-CM | POA: Diagnosis not present

## 2020-05-27 DIAGNOSIS — C679 Malignant neoplasm of bladder, unspecified: Secondary | ICD-10-CM | POA: Diagnosis not present

## 2020-05-28 ENCOUNTER — Inpatient Hospital Stay: Payer: Medicare Other | Admitting: Family Medicine

## 2020-05-28 DIAGNOSIS — C9000 Multiple myeloma not having achieved remission: Secondary | ICD-10-CM | POA: Diagnosis not present

## 2020-05-28 DIAGNOSIS — Z5111 Encounter for antineoplastic chemotherapy: Secondary | ICD-10-CM | POA: Diagnosis not present

## 2020-05-28 DIAGNOSIS — C679 Malignant neoplasm of bladder, unspecified: Secondary | ICD-10-CM | POA: Diagnosis not present

## 2020-06-05 ENCOUNTER — Ambulatory Visit: Payer: Medicare Other | Attending: Radiation Oncology | Admitting: Physical Therapy

## 2020-06-05 ENCOUNTER — Other Ambulatory Visit: Payer: Self-pay

## 2020-06-05 DIAGNOSIS — G8929 Other chronic pain: Secondary | ICD-10-CM | POA: Diagnosis not present

## 2020-06-05 DIAGNOSIS — R29898 Other symptoms and signs involving the musculoskeletal system: Secondary | ICD-10-CM | POA: Diagnosis not present

## 2020-06-05 DIAGNOSIS — M25511 Pain in right shoulder: Secondary | ICD-10-CM | POA: Insufficient documentation

## 2020-06-05 DIAGNOSIS — M25611 Stiffness of right shoulder, not elsewhere classified: Secondary | ICD-10-CM | POA: Diagnosis not present

## 2020-06-05 NOTE — Therapy (Addendum)
[facility] [address] [place], [place], [address] Phone: [phone]   Fax:  [phone]  Physical Therapy Evaluation  Patient Details  Name: Derek Castro MRN: [id number] Date of Birth: 06/23/1940 Referring Provider (PT): Hayden Pedro, [place]   Encounter Date: 06/05/2020   PT End of Session - 06/05/20 1034    Visit Number 1    Date for PT Re-Evaluation 07/31/20    Authorization Type Medicare A/B    PT Start Time 0932    PT Stop Time 1012    PT Time Calculation (min) 40 min    Activity Tolerance Patient tolerated treatment well    Behavior During Therapy [facility] for tasks assessed/performed           Past Medical History:  Diagnosis Date  . Alcoholism in remission ([place])    [facility] counseling three times per week as of 01/2017.  . Bladder cancer ([place]) 07/2019   07/10/2019 TURBT/gemcitabine instillation - LG, Ta, no muscle present  . Bone lesion   . Depression   . Fall 01/06/2018   pt fell wearing crocs while going up muddy embankment and hit head,no LOC went to ER no fractures pt sent home  . Family history of colon cancer in mother   . 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 Hyperlipidemia    Pt declined statin 08/2017  . Hypertension   . Insomnia    uses quetiapine for this  . Multiple myeloma ([place])    [facility]  . Osteoarthritis    C-spine--hx of fusion surgery.  R foot. Knees (hx of L TKA)  . Prediabetes   . Prostate cancer [facility])    [facility]    Past Surgical History:  Procedure Laterality Date  . ANTERIOR FUSION CERVICAL SPINE  2002  . autologous stem cell transplant  2010 and 2012   [facility] in [place].  Marland Kitchen COLONOSCOPY  2015   Recall 5 yrs ([facility] in [place], [place])  . CYSTOSCOPY  07/05/2019   for gross hematuria; noninvasive-appearing tumor found on cystoscopy->plan for TURBT.  Marland Kitchen  EXCISIONAL TOTAL KNEE ARTHROPLASTY  2014 and 2016   Both knees  . LAPAROSCOPIC RETROPUBIC PROSTATECTOMY     + hx of radiation to pelvis.  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  Procedure: LEFT EXCISION MASS;  Surgeon: Leta Baptist, MD;  Location: [place];  Service: ENT;  Laterality: Left;  . SKIN FULL THICKNESS GRAFT Left 01/31/2018   Procedure: SKIN GRAFT FULL THICKNESS;  Surgeon: Leta Baptist, MD;  Location: [place];  Service: ENT;  Laterality: Left;  . TRANSURETHRAL RESECTION OF BLADDER TUMOR WITH GYRUS (TURBT-GYRUS)  07/10/2019   07/10/2019 TURBT/gemcitabine instillation - LG, Ta, no muscle present    There were no vitals filed for this visit.    Subjective Assessment - 06/05/20 0936    Subjective Pt reports he has 90-95% use of Lt arm and 20% of Rt at this time. Pt is Rt handed. Pt states he believes he may have a rib fx also but this is undiagnosed - based on pain. Pt states he went shark fishing and had pain in Rt side but that is better. Pain is immediate in Rt shoulder when trying to lift it. Pain is 9/10 or 10/10 and has to stop using his arm. Pt began having problem  when using arm about 3 months ago. Of note, he has undergone radiation therapy and is undergoing chemo for bony metastasis and had multiple meylomas around Rt shoulder. Pt also has had anterior cervical fusion and very little cervical ROM. Pt also reports a port will be placed soon and he will have some lifting restrictions at that time, but no current restrictions other than pain   Pertinent History radiation to clavicular region; multiple meylomas; currently receiving cancer treatment and port to be placed will limit lifting; anterior cervical fusion; Bil TKAs    Limitations Lifting;House hold activities    Patient Stated Goals be able to use Rt arm like normal    Currently in Pain? No/denies              [facility] PT Assessment - 06/07/20 0001       Assessment   Medical Diagnosis C79.51 (ICD-10-CM) - Secondary malignant neoplasm of bone ([place]); C90.02 (ICD-10-CM) - Multiple myeloma in relapse [facility])    Referring Provider (PT) Hayden Pedro, PA-C    Onset Date/Surgical Date --   3 months   Prior Therapy No      Precautions   Precautions Other (comment)    Precaution Comments bony mets      Restrictions   Weight Bearing Restrictions No      Balance Screen   Has the patient fallen in the past 6 months No      [place] residence    Living Arrangements Spouse/significant other      Prior Function   Level of [place] fishing, yard work, walking dog, clean house and car      Cognition   Overall Cognitive Status Within Functional Limits for tasks assessed      Observation/Other Assessments   Other Surveys  Quick Dash    Quick DASH  34% limited      Posture/Postural Control   Posture/Postural Control Postural limitations    Postural Limitations Rounded Shoulders      AROM   AROM Assessment Site Shoulder    Right Shoulder Flexion 86 Degrees    Right Shoulder ABduction 65 Degrees    Right Shoulder Internal Rotation --   reach behind back to Rt SI joint   Left Shoulder Flexion 140 Degrees    Left Shoulder ABduction 140 Degrees    Left Shoulder Internal Rotation --   reach behind back to T8     PROM   Overall PROM Comments Rt flexion and abduction 140 deg with crepitus and popping starting about 50 degrees into the movement      Strength   Overall Strength Comments 5/5 with exceptions below    Strength Assessment Site Shoulder    Right/Left Shoulder Right    Right Shoulder Flexion 3/5   pain   Right Shoulder Extension 5/5    Right Shoulder ABduction 3/5   pain   Right Shoulder Internal Rotation 4/5    Right Shoulder External Rotation 4-/5   pain     Palpation   Palpation comment pecs, upper traps, scalenes tight bilateral      Special Tests    Other special tests hawkins-kennedy pos Rt      Ambulation/Gait   Gait Pattern Decreased arm swing - right;Decreased arm swing - left;Decreased weight shift to left;Decreased stride length                      Objective  measurements completed on examination: See above findings.       [place] Adult PT Treatment/Exercise - 06/07/20 0001      Self-Care   Self-Care Other Self-Care Comments    Other Self-Care Comments  educated and performed initial HEP Access Code: [email or web address]                  PT Education - 06/05/20 Cloud    Education Details Access Code: [email or web address]    Person(s) Educated Patient    Methods Explanation;Demonstration;Tactile cues;Verbal cues;Handout    Comprehension Verbalized understanding;Returned demonstration            PT Short Term Goals - 06/07/20 1954      PT SHORT TERM GOAL #1   Title pt will be ind with initial HEP    Time 4    Period Weeks    Status New    Target Date 07/03/20      PT SHORT TERM GOAL #2   Title Pt will report at least 25% less pain    Period Weeks    Status New    Target Date 07/03/20             PT Long Term Goals - 06/05/20 1048      PT LONG TERM GOAL #1   Title Pt will be ind with HEP for shoulder strength maintenance    Time 8    Period Weeks    Status New    Target Date 07/31/20      PT LONG TERM GOAL #2   Title DASH to be improved to 24% for reduced disability    Baseline 34% disability    Time 8    Period Weeks    Status New    Target Date 07/31/20      PT LONG TERM GOAL #3   Title Pt will be able to perform Rt shoulder flexion of at least 100 degrees in order to reach things on shelves more easily without always relying on the Lt UE    Time 8    Period Weeks    Status New    Target Date 07/31/20      PT LONG TERM GOAL #4   Title Pt will report his Rt arm is at least 50% of normal function due to improved strength and ROM.    Baseline 20%    Time 8    Period Weeks    Status New     Target Date 07/31/20                  Plan - 06/07/20 1956    Clinical Impression Statement Pt presents to skilled PT due to reduced UE function with Rt UE being the main complaint.  Pt is active and likes to do things around the house and go fishing.  He is currently limited due to comorbidities including matastasis and cervical fusion . Pt is limited with shoulder ROM and strength Rt>Lt as noted above.  P/ROM of Rt shoulder is WFL and only very mild pain but does have definite crepitus present.  Pt tests positive Michel Bickers Rt shoulder.  Pt will benefit from skilled PT to address strength and ROM of Rt shoulder and all above mentioned impairments so pt can continue with maximum function to maintain his ability to participate in normal activities.    Personal Factors and Comorbidities Comorbidity 3+    Comorbidities bony mets, cervical fusion, Bil TKA    Examination-Activity Limitations Dressing;Reach  Overhead;Lift    Examination-Participation Restrictions Cleaning;Community Activity;Driving    Stability/Clinical Decision Making Evolving/Moderate complexity    Clinical Decision Making Moderate    Rehab Potential Good    PT Frequency 2x / week    PT Duration 8 weeks    PT Treatment/Interventions ADLs/Self Care Home Management;Biofeedback;Cryotherapy;Electrical Stimulation;Moist Heat;Neuromuscular re-education;Therapeutic exercise;Therapeutic activities;Patient/family education;Manual techniques;Taping;Dry needling;Passive range of motion    PT Next Visit Plan P/ROM; AAROM Rt shoulder; f/u on initial HEP; gentle strength scap and posture stability; closely monitor pain with all strengthening due to increased risk of fx due to bony mets; STM and pain management as needed    PT Home Exercise Plan Access Code: [place] and Agree with Plan of Care Patient           Patient will benefit from skilled therapeutic intervention in order to improve the following deficits and  impairments:  Pain,Postural dysfunction,Increased fascial restricitons,Decreased strength,Decreased range of motion,Impaired UE functional use  Visit Diagnosis: Shoulder weakness  Stiffness of right shoulder, not elsewhere classified  Chronic right shoulder pain     Problem List Patient Active Problem List   Diagnosis Date Noted  . SIRS (systemic inflammatory response syndrome) ([place]) 05/10/2020  . Hyperlipidemia   . Aortic atherosclerosis ([place])   . CAD (coronary artery disease)   . Sepsis ([place]) 05/09/2020  . Depression 03/03/2020  . Hypertension, essential 01/29/2020  . Bilateral arm pain 01/29/2020  . Fever and chills 01/29/2020  . Pneumonia 01/29/2020  . History of bladder cancer 01/29/2020  . 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 Bladder tumor 07/07/2019  . 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 Ganglion cyst 04/09/2017  . Erectile dysfunction 12/20/2016  . Osteoarthrosis, ankle and foot 06/03/2016  . Tibialis posterior dysfunction 06/03/2016  . Hallux valgus with bunions 06/03/2016  . Degenerative joint disease of ankle and foot, right 06/03/2016  . Difficult or painful urination 06/02/2016  . Malignant neoplasm of prostate ([place]) 03/09/2016  . History of prostate cancer 01/20/2016  . Bone lesion 08/12/2015  . Anemia due to chemotherapy 07/13/2015  . Pain in left knee 11/24/2014  . Multiple myeloma in relapse ([place]) 07/04/2014    Jule Ser, PT 06/07/2020, 8:21 PM   [facility] [address] [place], [place], [address] Phone: [phone]   Fax:  [phone]  Name: Derek Castro MRN: [id number] Date of Birth: 12/13/1940

## 2020-06-05 NOTE — Patient Instructions (Signed)
Access Code: [email or web address] URL: [email or web address] Date: 06/05/2020 Prepared by: Jari Favre  Exercises Seated Shoulder Flexion Slide at Table Top with Forearm in Neutral - 3 x daily - 7 x weekly - 1 sets - 10 reps - 5 sec hold Seated Shoulder Abduction Towel Slide at Table Top with Forearm in Neutral - 1 x daily - 7 x weekly - 3 sets - 10 reps Seated Scapular Retraction - 3 x daily - 7 x weekly - 1 sets - 10 reps - 5 sec hold Standing Isometric Shoulder Internal Rotation with Towel Roll at Doorway - 1 x daily - 7 x weekly - 3 sets - 10 reps Standing Isometric Shoulder External Rotation with Doorway and Towel Roll - 1 x daily - 7 x weekly - 3 sets - 10 reps

## 2020-06-07 ENCOUNTER — Encounter: Payer: Self-pay | Admitting: Physical Therapy

## 2020-06-07 NOTE — Addendum Note (Signed)
Addended by: Su Hoff on: 06/07/2020 08:23 PM   Modules accepted: Orders

## 2020-06-09 DIAGNOSIS — Z8546 Personal history of malignant neoplasm of prostate: Secondary | ICD-10-CM | POA: Diagnosis not present

## 2020-06-09 DIAGNOSIS — C9000 Multiple myeloma not having achieved remission: Secondary | ICD-10-CM | POA: Diagnosis not present

## 2020-06-09 DIAGNOSIS — Z79899 Other long term (current) drug therapy: Secondary | ICD-10-CM | POA: Diagnosis not present

## 2020-06-09 DIAGNOSIS — Z5111 Encounter for antineoplastic chemotherapy: Secondary | ICD-10-CM | POA: Diagnosis not present

## 2020-06-09 DIAGNOSIS — I10 Essential (primary) hypertension: Secondary | ICD-10-CM | POA: Diagnosis not present

## 2020-06-09 DIAGNOSIS — Z923 Personal history of irradiation: Secondary | ICD-10-CM | POA: Diagnosis not present

## 2020-06-09 DIAGNOSIS — Z8551 Personal history of malignant neoplasm of bladder: Secondary | ICD-10-CM | POA: Diagnosis not present

## 2020-06-09 DIAGNOSIS — R21 Rash and other nonspecific skin eruption: Secondary | ICD-10-CM | POA: Diagnosis not present

## 2020-06-11 DIAGNOSIS — C9000 Multiple myeloma not having achieved remission: Secondary | ICD-10-CM | POA: Diagnosis not present

## 2020-06-12 ENCOUNTER — Encounter: Payer: Medicare Other | Admitting: Physical Therapy

## 2020-06-15 ENCOUNTER — Telehealth: Payer: Self-pay | Admitting: Radiation Oncology

## 2020-06-17 ENCOUNTER — Ambulatory Visit: Payer: Self-pay | Admitting: *Deleted

## 2020-06-17 ENCOUNTER — Encounter: Payer: Medicare Other | Admitting: Physical Therapy

## 2020-06-22 ENCOUNTER — Encounter: Payer: Self-pay | Admitting: Physical Therapy

## 2020-06-22 ENCOUNTER — Other Ambulatory Visit: Payer: Self-pay

## 2020-06-22 ENCOUNTER — Telehealth: Payer: Self-pay | Admitting: Hematology and Oncology

## 2020-06-22 ENCOUNTER — Ambulatory Visit: Payer: Medicare Other | Admitting: Physical Therapy

## 2020-06-22 DIAGNOSIS — G8929 Other chronic pain: Secondary | ICD-10-CM

## 2020-06-22 DIAGNOSIS — R29898 Other symptoms and signs involving the musculoskeletal system: Secondary | ICD-10-CM | POA: Diagnosis not present

## 2020-06-22 DIAGNOSIS — M25611 Stiffness of right shoulder, not elsewhere classified: Secondary | ICD-10-CM

## 2020-06-22 DIAGNOSIS — M25511 Pain in right shoulder: Secondary | ICD-10-CM | POA: Diagnosis not present

## 2020-06-22 NOTE — Therapy (Signed)
[facility] [address], [place], [place], [address] Phone: [phone]   Fax:  [phone]  Physical Therapy Treatment  Patient Details  Name: Derek Castro MRN: [id number] Date of Birth: [birth year] Referring Provider (PT): Hayden Pedro, [place]   Encounter Date: 06/22/2020   PT End of Session - 06/22/20 1232    Visit Number 2    Date for PT Re-Evaluation 07/31/20    Authorization Type Medicare A/B    PT Start Time 1232    PT Stop Time 1315    PT Time Calculation (min) 43 min    Activity Tolerance Patient tolerated treatment well    Behavior During Therapy [facility] for tasks assessed/performed           Past Medical History:  Diagnosis Date  . Alcoholism in remission ([place])    [facility] counseling three times per week as of 01/2017.  . Bladder cancer ([place]) 07/2019   07/10/2019 TURBT/gemcitabine instillation - LG, Ta, no muscle present  . Bone lesion   . Depression   . Fall 01/06/2018   pt fell wearing crocs while going up muddy embankment and hit head,no LOC went to ER no fractures pt sent home  . Family history of colon cancer in mother   . 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 Hyperlipidemia    Pt declined statin 08/2017  . Hypertension   . Insomnia    uses quetiapine for this  . Multiple myeloma ([place])    [facility]  . Osteoarthritis    C-spine--hx of fusion surgery.  R foot. Knees (hx of L TKA)  . Prediabetes   . Prostate cancer [facility])    [facility]    Past Surgical History:  Procedure Laterality Date  . ANTERIOR FUSION CERVICAL SPINE  2002  . autologous stem cell transplant  2010 and 2012   [facility] in [place].  Marland Kitchen COLONOSCOPY  2015   Recall 5 yrs ([facility] in [place], [place])  . CYSTOSCOPY  07/05/2019   for gross hematuria; noninvasive-appearing tumor found on cystoscopy->plan for TURBT.  Marland Kitchen  EXCISIONAL TOTAL KNEE ARTHROPLASTY  2014 and 2016   Both knees  . LAPAROSCOPIC RETROPUBIC PROSTATECTOMY     + hx of radiation to pelvis.  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  Procedure: LEFT EXCISION MASS;  Surgeon: Leta Baptist, MD;  Location: [place];  Service: ENT;  Laterality: Left;  . SKIN FULL THICKNESS GRAFT Left 01/31/2018   Procedure: SKIN GRAFT FULL THICKNESS;  Surgeon: Leta Baptist, MD;  Location: [place];  Service: ENT;  Laterality: Left;  . TRANSURETHRAL RESECTION OF BLADDER TUMOR WITH GYRUS (TURBT-GYRUS)  07/10/2019   07/10/2019 TURBT/gemcitabine instillation - LG, Ta, no muscle present    There were no vitals filed for this visit.   Subjective Assessment - 06/22/20 1233    Subjective Had his ports put in his Rt arm, they are doing fine.    Pertinent History radiation to clavicular region; multiple meylomas; currently receiving cancer treatment and port to be placed will limit lifting; anterior cervical fusion; Bil TKAs    Currently in Pain? Yes   0/10 at rest, 5/10 with use.   Pain Location Shoulder    Pain Orientation Right    Aggravating Factors  Using it, moving it    Pain Relieving Factors resting,  not using it    Multiple Pain Sites No                             [facility] Adult PT Treatment/Exercise - 06/22/20 0001      Shoulder Exercises: Supine   Other Supine Exercises Flexion with cane: only to 90 degrees like a chest press 2x10   2nd set added overhead flexion stretch 10x to tolerance     Shoulder Exercises: Seated   Flexion AAROM;Right;5 reps   table slides   Other Seated Exercises Scap squeezes: 2x5 towel for lumbar , TC at scapula    Other Seated Exercises Fingers clapsed 10x but last 3-4 reps had increased pain. Returned to supine and was less painful 5x      Manual Therapy   Manual Therapy Soft tissue mobilization    Soft tissue mobilization RT shoulder and scapula  in sitting                  PT Education - 06/22/20 1319    Education Details HEP, supine cane    Person(s) Educated Patient    Methods Explanation;Verbal cues;Handout    Comprehension Returned demonstration;Verbalized understanding            PT Short Term Goals - 06/07/20 1954      PT SHORT TERM GOAL #1   Title pt will be ind with initial HEP    Time 4    Period Weeks    Status New    Target Date 07/03/20      PT SHORT TERM GOAL #2   Title Pt will report at least 25% less pain    Period Weeks    Status New    Target Date 07/03/20             PT Long Term Goals - 06/05/20 1048      PT LONG TERM GOAL #1   Title Pt will be ind with HEP for shoulder strength maintenance    Time 8    Period Weeks    Status New    Target Date 07/31/20      PT LONG TERM GOAL #2   Title DASH to be improved to 24% for reduced disability    Baseline 34% disability    Time 8    Period Weeks    Status New    Target Date 07/31/20      PT LONG TERM GOAL #3   Title Pt will be able to perform Rt shoulder flexion of at least 100 degrees in order to reach things on shelves more easily without always relying on the Lt UE    Time 8    Period Weeks    Status New    Target Date 07/31/20      PT LONG TERM GOAL #4   Title Pt will report his Rt arm is at least 50% of normal function due to improved strength and ROM.    Baseline 20%    Time 8    Period Weeks    Status New    Target Date 07/31/20                 Plan - 06/22/20 1233    Clinical Impression Statement Pt arrives with reports of falling on his RT shoulder the other day so he has not done any of his HEP until the pain had subsided. Pain has subsided and reports  he is "back to how he felt before he fell." Pt had less pain in supine than in sitting with AAROM exercises today. Pt was given some supine flexion with cane for HEP and he can choose to do a little seated stretches and supine, whichever is less painful  that day. Soft tissue of general Rt shoulder and scapula tight and improved with soft tissue work.    Personal Factors and Comorbidities Comorbidity 3+    Comorbidities bony mets, cervical fusion, Bil TKA    Examination-Activity Limitations Dressing;Reach Overhead;Lift    Examination-Participation Restrictions Cleaning;Community Activity;Driving    Stability/Clinical Decision Making Evolving/Moderate complexity    Rehab Potential Good    PT Frequency 2x / week    PT Duration 8 weeks    PT Treatment/Interventions ADLs/Self Care Home Management;Biofeedback;Cryotherapy;Electrical Stimulation;Moist Heat;Neuromuscular re-education;Therapeutic exercise;Therapeutic activities;Patient/family education;Manual techniques;Taping;Dry needling;Passive range of motion    PT Next Visit Plan P/ROM; AAROM Rt shoulder; gentle strength scap and posture stability; closely monitor pain with all strengthening due to increased risk of fx due to bony mets; STM and pain management as needed    PT Home Exercise Plan Access Code: [email or web address]    Consulted and Agree with Plan of Care Patient           Patient will benefit from skilled therapeutic intervention in order to improve the following deficits and impairments:  Pain,Postural dysfunction,Increased fascial restricitons,Decreased strength,Decreased range of motion,Impaired UE functional use  Visit Diagnosis: Shoulder weakness  Stiffness of right shoulder, not elsewhere classified  Chronic right shoulder pain     Problem List Patient Active Problem List   Diagnosis Date Noted  . SIRS (systemic inflammatory response syndrome) ([place]) 05/10/2020  . Hyperlipidemia   . Aortic atherosclerosis ([place])   . CAD (coronary artery disease)   . Sepsis ([place]) 05/09/2020  . Depression 03/03/2020  . Hypertension, essential 01/29/2020  . Bilateral arm pain 01/29/2020  . Fever and chills 01/29/2020  . Pneumonia 01/29/2020  . History of bladder cancer 01/29/2020  .  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 Bladder tumor 07/07/2019  . 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 Ganglion cyst 04/09/2017  . Erectile dysfunction 12/20/2016  . Osteoarthrosis, ankle and foot 06/03/2016  . Tibialis posterior dysfunction 06/03/2016  . Hallux valgus with bunions 06/03/2016  . Degenerative joint disease of ankle and foot, right 06/03/2016  . Difficult or painful urination 06/02/2016  . Malignant neoplasm of prostate ([place]) 03/09/2016  . History of prostate cancer 01/20/2016  . Bone lesion 08/12/2015  . Anemia due to chemotherapy 07/13/2015  . Pain in left knee 11/24/2014  . Multiple myeloma in relapse ([place]) 07/04/2014    Sarai January, PTA 06/22/2020, 1:20 PM  [facility] [address] [place], [place], [address] Phone: [phone]   Fax:  [phone]  Name: Derek Castro MRN: [id number] Date of Birth: May 18, 1941

## 2020-06-22 NOTE — Telephone Encounter (Signed)
  Radiation Oncology         [phone] ________________________________  Name: Derek Castro MRN: [id number]  Date of Service: 06/22/20  DOB: [birth year]  Post Treatment Telephone Note  Diagnosis:  Multiple myeloma not having achieved remission with painful bone metastases  Interval Since Last Radiation:  8 weeks   04/20/20-05/01/20: Right humeral head, bilateral scapulae, and bilateral clavicular heads were treated to 24 Gy in 8 fractions  04/17/16-06/02/16:  The prostatic fossa was treated to 68.4 Gy in 38 fractions by Dr. Tammi Klippel.  2014: The patient's spine at levels T5, T12-L1 was treated with palliative radiotherapy with Dr. Tammi Klippel  2003: The patient's spine at C2 was treated with palliative radiotherapy in [place], [place]  Narrative:  The patient was contacted today for routine follow-up. During treatment he did very well with radiotherapy and did not have significant desquamation.    Impression/Plan: 1. Multiple myeloma not having achieved remission with painful bone metastases. I was unable to reach the patient but left him a voicemail and on it, I discussed that we would be happy to continue to follow him as needed, but he will also continue to follow up with Dr. Alvie Heidelberg in medical oncology at [facility].  2.         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 The patient remains under the care of urology at [facility] as well. We will follow this expectantly as PSA remains <0.01 under the care of Dr. Alvie Heidelberg in medical oncology as well.    Carola Rhine, PAC

## 2020-06-22 NOTE — Telephone Encounter (Signed)
Received a call from the pt's wife to schedule an appt for multiple myeloma. They are currently in [place] and will be moving back to [place] towards the end of February. Pt has been scheduled to see Dr. Lorenso Courier on 2/25 at 9am.

## 2020-06-23 DIAGNOSIS — C9000 Multiple myeloma not having achieved remission: Secondary | ICD-10-CM | POA: Diagnosis not present

## 2020-06-23 DIAGNOSIS — Z5111 Encounter for antineoplastic chemotherapy: Secondary | ICD-10-CM | POA: Diagnosis not present

## 2020-06-23 DIAGNOSIS — Z79899 Other long term (current) drug therapy: Secondary | ICD-10-CM | POA: Diagnosis not present

## 2020-06-23 DIAGNOSIS — M8458XA Pathological fracture in neoplastic disease, other specified site, initial encounter for fracture: Secondary | ICD-10-CM | POA: Diagnosis not present

## 2020-06-24 ENCOUNTER — Encounter: Payer: Medicare Other | Admitting: Physical Therapy

## 2020-06-28 NOTE — Progress Notes (Signed)
  Radiation Oncology         [phone] ________________________________  Name: Derek Castro MRN: [id number]  Date: 05/01/2020  DOB: 12-18-1940  End of Treatment Note  Diagnosis:   multiple myeloma      Indication for treatment::  palliative       Radiation treatment dates:   04/20/20 - 05/01/20  Site/dose:    1.  The left shoulder was treated to a dose of 24 Gy in 8 fractions using a 2-field isodose technique.  2.  The right shoulder was treated to a dose of 24 Gy in 8 fractions using a 2-field isodose technique.  3.  The sternum was treated to a dose of 24 Gy in 8 fractions using a 3-field isodose technique.   Narrative: The patient tolerated radiation treatment relatively well.     Plan: The patient has completed radiation treatment. The patient will return to radiation oncology clinic for routine followup in one month. I advised the patient to call or return sooner if they have any questions or concerns related to their recovery or treatment. ________________________________  Jodelle Gross, M.D., Ph.D.

## 2020-06-29 ENCOUNTER — Encounter: Payer: Medicare Other | Admitting: Physical Therapy

## 2020-06-30 DIAGNOSIS — Z5111 Encounter for antineoplastic chemotherapy: Secondary | ICD-10-CM | POA: Diagnosis not present

## 2020-06-30 DIAGNOSIS — C9000 Multiple myeloma not having achieved remission: Secondary | ICD-10-CM | POA: Diagnosis not present

## 2020-07-01 ENCOUNTER — Encounter: Payer: Medicare Other | Admitting: Physical Therapy

## 2020-07-06 ENCOUNTER — Ambulatory Visit: Payer: Medicare Other | Attending: Radiation Oncology | Admitting: Physical Therapy

## 2020-07-06 ENCOUNTER — Other Ambulatory Visit: Payer: Self-pay

## 2020-07-06 ENCOUNTER — Encounter: Payer: Self-pay | Admitting: Physical Therapy

## 2020-07-06 DIAGNOSIS — M25611 Stiffness of right shoulder, not elsewhere classified: Secondary | ICD-10-CM | POA: Diagnosis not present

## 2020-07-06 DIAGNOSIS — G8929 Other chronic pain: Secondary | ICD-10-CM

## 2020-07-06 DIAGNOSIS — R29898 Other symptoms and signs involving the musculoskeletal system: Secondary | ICD-10-CM | POA: Diagnosis not present

## 2020-07-06 DIAGNOSIS — M25511 Pain in right shoulder: Secondary | ICD-10-CM | POA: Insufficient documentation

## 2020-07-06 NOTE — Therapy (Signed)
[facility] [address], [place], [place], [address] Phone: [phone]   Fax:  [phone]  Physical Therapy Treatment  Patient Details  Name: SHANDY VI MRN: [id number] Date of Birth: 08/31/1940 Referring Provider (PT): Hayden Pedro, [place]   Encounter Date: 07/06/2020   PT End of Session - 07/06/20 1231    Visit Number 3    Date for PT Re-Evaluation 07/31/20    Authorization Type Medicare A/B    PT Start Time 1230    PT Stop Time 1310    PT Time Calculation (min) 40 min    Activity Tolerance Patient tolerated treatment well    Behavior During Therapy [facility] for tasks assessed/performed           Past Medical History:  Diagnosis Date  . Alcoholism in remission ([place])    [facility] counseling three times per week as of 01/2017.  . Bladder cancer ([place]) 07/2019   07/10/2019 TURBT/gemcitabine instillation - LG, Ta, no muscle present  . Bone lesion   . Depression   . Fall 01/06/2018   pt fell wearing crocs while going up muddy embankment and hit head,no LOC went to ER no fractures pt sent home  . Family history of colon cancer in mother   . 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 Hyperlipidemia    Pt declined statin 08/2017  . Hypertension   . Insomnia    uses quetiapine for this  . Multiple myeloma ([place])    [facility]  . Osteoarthritis    C-spine--hx of fusion surgery.  R foot. Knees (hx of L TKA)  . Prediabetes   . Prostate cancer [facility])    [facility]    Past Surgical History:  Procedure Laterality Date  . ANTERIOR FUSION CERVICAL SPINE  2002  . autologous stem cell transplant  2010 and 2012   [facility] in [place].  Marland Kitchen COLONOSCOPY  2015   Recall 5 yrs ([facility] in [place], [place])  . CYSTOSCOPY  07/05/2019   for gross hematuria; noninvasive-appearing tumor found on cystoscopy->plan for TURBT.  Marland Kitchen  EXCISIONAL TOTAL KNEE ARTHROPLASTY  2014 and 2016   Both knees  . LAPAROSCOPIC RETROPUBIC PROSTATECTOMY     + hx of radiation to pelvis.  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  Procedure: LEFT EXCISION MASS;  Surgeon: Leta Baptist, MD;  Location: [place];  Service: ENT;  Laterality: Left;  . SKIN FULL THICKNESS GRAFT Left 01/31/2018   Procedure: SKIN GRAFT FULL THICKNESS;  Surgeon: Leta Baptist, MD;  Location: [place];  Service: ENT;  Laterality: Left;  . TRANSURETHRAL RESECTION OF BLADDER TUMOR WITH GYRUS (TURBT-GYRUS)  07/10/2019   07/10/2019 TURBT/gemcitabine instillation - LG, Ta, no muscle present    There were no vitals filed for this visit.   Subjective Assessment - 07/06/20 1234    Subjective Shoulder hurting today,    Currently in Pain? Yes   at rest 1/10, with movement 8/10   Pain Location Shoulder    Pain Orientation Right    Pain Descriptors / Indicators Sore                             [facility] Adult PT Treatment/Exercise - 07/06/20 0001      Shoulder Exercises: Seated   Flexion --  Fingers clasped flexion 2x10   Other Seated Exercises Scap squeezes: 2x10 towel for lumbar , TC at scapula    Other Seated Exercises UE ranger 1 min forward/backward;      Shoulder Exercises: Standing   Other Standing Exercises wall slides flexion 5x      Manual Therapy   Soft tissue mobilization RT shoulder , upper trap and scapula in sitting                    PT Short Term Goals - 07/06/20 1256      PT SHORT TERM GOAL #1   Title pt will be ind with initial HEP    Time 4    Period Weeks    Status On-going    Target Date 07/03/20             PT Long Term Goals - 06/05/20 1048      PT LONG TERM GOAL #1   Title Pt will be ind with HEP for shoulder strength maintenance    Time 8    Period Weeks    Status New    Target Date 07/31/20      PT LONG TERM GOAL #2   Title DASH to  be improved to 24% for reduced disability    Baseline 34% disability    Time 8    Period Weeks    Status New    Target Date 07/31/20      PT LONG TERM GOAL #3   Title Pt will be able to perform Rt shoulder flexion of at least 100 degrees in order to reach things on shelves more easily without always relying on the Lt UE    Time 8    Period Weeks    Status New    Target Date 07/31/20      PT LONG TERM GOAL #4   Title Pt will report his Rt arm is at least 50% of normal function due to improved strength and ROM.    Baseline 20%    Time 8    Period Weeks    Status New    Target Date 07/31/20                 Plan - 07/06/20 1232    Clinical Impression Statement Pt arrives with increased shoulder pain today, unsure why. He reports he is getting better by 1/2% every day. ADLs are "no problem" at this time but pt cannot lift his RTUE up much > 80 degrees without significant pain. Pt could do about 5 wall slides today in flexion before having to stop due to pain. Pt had audible grinding in the Rt shoulder for the first 3 reps. Pt reports he is trying to be more aware of his posture and getting his shoulders back more often. He is non-compliant with initial HEP.    Personal Factors and Comorbidities Comorbidity 3+    Comorbidities bony mets, cervical fusion, Bil TKA    Examination-Activity Limitations Dressing;Reach Overhead;Lift    Examination-Participation Restrictions Cleaning;Community Activity;Driving    Stability/Clinical Decision Making Evolving/Moderate complexity    Rehab Potential Good    PT Frequency 2x / week    PT Duration 8 weeks    PT Treatment/Interventions ADLs/Self Care Home Management;Biofeedback;Cryotherapy;Electrical Stimulation;Moist Heat;Neuromuscular re-education;Therapeutic exercise;Therapeutic activities;Patient/family education;Manual techniques;Taping;Dry needling;Passive range of motion    PT Next Visit Plan P/ROM; AAROM Rt shoulder; gentle strength scap  and posture stability; closely monitor pain with all strengthening due to increased  risk of fx due to bony mets; STM and pain management as needed    PT Home Exercise Plan Access Code: [email or web address]    Consulted and Agree with Plan of Care Patient           Patient will benefit from skilled therapeutic intervention in order to improve the following deficits and impairments:  Pain,Postural dysfunction,Increased fascial restricitons,Decreased strength,Decreased range of motion,Impaired UE functional use  Visit Diagnosis: Shoulder weakness  Stiffness of right shoulder, not elsewhere classified  Chronic right shoulder pain     Problem List Patient Active Problem List   Diagnosis Date Noted  . SIRS (systemic inflammatory response syndrome) ([place]) 05/10/2020  . Hyperlipidemia   . Aortic atherosclerosis ([place])   . CAD (coronary artery disease)   . Sepsis ([place]) 05/09/2020  . Depression 03/03/2020  . Hypertension, essential 01/29/2020  . Bilateral arm pain 01/29/2020  . Fever and chills 01/29/2020  . Pneumonia 01/29/2020  . History of bladder cancer 01/29/2020  . 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 Bladder tumor 07/07/2019  . 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 Ganglion cyst 04/09/2017  . Erectile dysfunction 12/20/2016  . Osteoarthrosis, ankle and foot 06/03/2016  . Tibialis posterior dysfunction 06/03/2016  . Hallux valgus with bunions 06/03/2016  . Degenerative joint disease of ankle and foot, right 06/03/2016  . Difficult or painful urination 06/02/2016  . Malignant neoplasm of prostate ([place]) 03/09/2016  . History of prostate cancer 01/20/2016  . Bone lesion 08/12/2015  . Anemia due to chemotherapy 07/13/2015  . Pain in left knee 11/24/2014  . Multiple myeloma in relapse ([place]) 07/04/2014    COCHRAN,JENNIFER, PTA 07/06/2020, 1:15 PM  [facility] [address] [place], [place], [address] Phone: [phone]   Fax:  [phone]  Name: GIANN OBARA MRN: [id number] Date of Birth: Nov 05, 1940

## 2020-07-08 ENCOUNTER — Ambulatory Visit: Payer: Medicare Other | Admitting: Physical Therapy

## 2020-07-09 DIAGNOSIS — Z8551 Personal history of malignant neoplasm of bladder: Secondary | ICD-10-CM | POA: Diagnosis not present

## 2020-07-09 DIAGNOSIS — Z5111 Encounter for antineoplastic chemotherapy: Secondary | ICD-10-CM | POA: Diagnosis not present

## 2020-07-09 DIAGNOSIS — Z23 Encounter for immunization: Secondary | ICD-10-CM | POA: Diagnosis not present

## 2020-07-09 DIAGNOSIS — R109 Unspecified abdominal pain: Secondary | ICD-10-CM | POA: Diagnosis not present

## 2020-07-09 DIAGNOSIS — I10 Essential (primary) hypertension: Secondary | ICD-10-CM | POA: Diagnosis not present

## 2020-07-09 DIAGNOSIS — Z79899 Other long term (current) drug therapy: Secondary | ICD-10-CM | POA: Diagnosis not present

## 2020-07-09 DIAGNOSIS — R454 Irritability and anger: Secondary | ICD-10-CM | POA: Diagnosis not present

## 2020-07-09 DIAGNOSIS — L738 Other specified follicular disorders: Secondary | ICD-10-CM | POA: Diagnosis not present

## 2020-07-09 DIAGNOSIS — R29898 Other symptoms and signs involving the musculoskeletal system: Secondary | ICD-10-CM | POA: Diagnosis not present

## 2020-07-09 DIAGNOSIS — L700 Acne vulgaris: Secondary | ICD-10-CM | POA: Diagnosis not present

## 2020-07-09 DIAGNOSIS — C9000 Multiple myeloma not having achieved remission: Secondary | ICD-10-CM | POA: Diagnosis not present

## 2020-07-09 DIAGNOSIS — Z923 Personal history of irradiation: Secondary | ICD-10-CM | POA: Diagnosis not present

## 2020-07-09 DIAGNOSIS — Z8546 Personal history of malignant neoplasm of prostate: Secondary | ICD-10-CM | POA: Diagnosis not present

## 2020-07-09 DIAGNOSIS — Z87311 Personal history of (healed) other pathological fracture: Secondary | ICD-10-CM | POA: Diagnosis not present

## 2020-07-09 DIAGNOSIS — R197 Diarrhea, unspecified: Secondary | ICD-10-CM | POA: Diagnosis not present

## 2020-07-09 DIAGNOSIS — Z9484 Stem cells transplant status: Secondary | ICD-10-CM | POA: Diagnosis not present

## 2020-07-09 DIAGNOSIS — M79671 Pain in right foot: Secondary | ICD-10-CM | POA: Diagnosis not present

## 2020-07-09 DIAGNOSIS — M8458XA Pathological fracture in neoplastic disease, other specified site, initial encounter for fracture: Secondary | ICD-10-CM | POA: Diagnosis not present

## 2020-07-09 DIAGNOSIS — L821 Other seborrheic keratosis: Secondary | ICD-10-CM | POA: Diagnosis not present

## 2020-07-09 DIAGNOSIS — M79602 Pain in left arm: Secondary | ICD-10-CM | POA: Diagnosis not present

## 2020-07-13 ENCOUNTER — Encounter: Payer: Medicare Other | Admitting: Physical Therapy

## 2020-07-15 DIAGNOSIS — D801 Nonfamilial hypogammaglobulinemia: Secondary | ICD-10-CM | POA: Diagnosis not present

## 2020-07-15 DIAGNOSIS — C9000 Multiple myeloma not having achieved remission: Secondary | ICD-10-CM | POA: Diagnosis not present

## 2020-07-15 DIAGNOSIS — R0602 Shortness of breath: Secondary | ICD-10-CM | POA: Diagnosis not present

## 2020-07-15 DIAGNOSIS — D849 Immunodeficiency, unspecified: Secondary | ICD-10-CM | POA: Diagnosis not present

## 2020-07-16 ENCOUNTER — Encounter: Payer: Medicare Other | Admitting: Physical Therapy

## 2020-07-20 ENCOUNTER — Encounter: Payer: Medicare Other | Admitting: Physical Therapy

## 2020-07-21 ENCOUNTER — Telehealth: Payer: Self-pay | Admitting: Hematology and Oncology

## 2020-07-21 NOTE — Telephone Encounter (Signed)
Received a call from the pt's wife to cancel his appt. They are moving out of town.

## 2020-07-22 ENCOUNTER — Encounter: Payer: Medicare Other | Admitting: Physical Therapy

## 2020-07-23 DIAGNOSIS — M2011 Hallux valgus (acquired), right foot: Secondary | ICD-10-CM | POA: Diagnosis not present

## 2020-07-23 DIAGNOSIS — Z923 Personal history of irradiation: Secondary | ICD-10-CM | POA: Diagnosis not present

## 2020-07-23 DIAGNOSIS — C7951 Secondary malignant neoplasm of bone: Secondary | ICD-10-CM | POA: Diagnosis not present

## 2020-07-23 DIAGNOSIS — Z5111 Encounter for antineoplastic chemotherapy: Secondary | ICD-10-CM | POA: Diagnosis not present

## 2020-07-23 DIAGNOSIS — R109 Unspecified abdominal pain: Secondary | ICD-10-CM | POA: Diagnosis not present

## 2020-07-23 DIAGNOSIS — C674 Malignant neoplasm of posterior wall of bladder: Secondary | ICD-10-CM | POA: Diagnosis not present

## 2020-07-23 DIAGNOSIS — R197 Diarrhea, unspecified: Secondary | ICD-10-CM | POA: Diagnosis not present

## 2020-07-23 DIAGNOSIS — R454 Irritability and anger: Secondary | ICD-10-CM | POA: Diagnosis not present

## 2020-07-23 DIAGNOSIS — C9000 Multiple myeloma not having achieved remission: Secondary | ICD-10-CM | POA: Diagnosis not present

## 2020-07-23 DIAGNOSIS — Z8546 Personal history of malignant neoplasm of prostate: Secondary | ICD-10-CM | POA: Diagnosis not present

## 2020-07-24 ENCOUNTER — Ambulatory Visit: Payer: Medicare Other | Admitting: Hematology and Oncology

## 2020-07-24 DIAGNOSIS — C9000 Multiple myeloma not having achieved remission: Secondary | ICD-10-CM | POA: Diagnosis not present

## 2020-07-30 DIAGNOSIS — C9000 Multiple myeloma not having achieved remission: Secondary | ICD-10-CM | POA: Diagnosis not present

## 2020-07-30 DIAGNOSIS — Z5111 Encounter for antineoplastic chemotherapy: Secondary | ICD-10-CM | POA: Diagnosis not present

## 2020-07-31 ENCOUNTER — Other Ambulatory Visit: Payer: Self-pay | Admitting: Family Medicine

## 2020-08-21 ENCOUNTER — Other Ambulatory Visit: Payer: Self-pay | Admitting: Family Medicine

## 2020-08-25 DIAGNOSIS — I10 Essential (primary) hypertension: Secondary | ICD-10-CM | POA: Diagnosis not present

## 2020-08-25 DIAGNOSIS — R109 Unspecified abdominal pain: Secondary | ICD-10-CM | POA: Diagnosis not present

## 2020-08-25 DIAGNOSIS — Z8551 Personal history of malignant neoplasm of bladder: Secondary | ICD-10-CM | POA: Diagnosis not present

## 2020-08-25 DIAGNOSIS — Z79899 Other long term (current) drug therapy: Secondary | ICD-10-CM | POA: Diagnosis not present

## 2020-08-25 DIAGNOSIS — R454 Irritability and anger: Secondary | ICD-10-CM | POA: Diagnosis not present

## 2020-08-25 DIAGNOSIS — M79601 Pain in right arm: Secondary | ICD-10-CM | POA: Diagnosis not present

## 2020-08-25 DIAGNOSIS — C9000 Multiple myeloma not having achieved remission: Secondary | ICD-10-CM | POA: Diagnosis not present

## 2020-08-25 DIAGNOSIS — R531 Weakness: Secondary | ICD-10-CM | POA: Diagnosis not present

## 2020-08-25 DIAGNOSIS — Z8546 Personal history of malignant neoplasm of prostate: Secondary | ICD-10-CM | POA: Diagnosis not present

## 2020-08-25 DIAGNOSIS — Z5111 Encounter for antineoplastic chemotherapy: Secondary | ICD-10-CM | POA: Diagnosis not present

## 2020-08-25 DIAGNOSIS — M79602 Pain in left arm: Secondary | ICD-10-CM | POA: Diagnosis not present

## 2020-08-25 DIAGNOSIS — R197 Diarrhea, unspecified: Secondary | ICD-10-CM | POA: Diagnosis not present

## 2020-09-01 ENCOUNTER — Emergency Department (HOSPITAL_BASED_OUTPATIENT_CLINIC_OR_DEPARTMENT_OTHER)
Admission: EM | Admit: 2020-09-01 | Discharge: 2020-09-01 | Disposition: A | Discharge status: Home / Self Care | Payer: Medicare Other | Attending: Emergency Medicine | Admitting: Emergency Medicine

## 2020-09-01 ENCOUNTER — Other Ambulatory Visit (HOSPITAL_BASED_OUTPATIENT_CLINIC_OR_DEPARTMENT_OTHER): Payer: Self-pay

## 2020-09-01 ENCOUNTER — Emergency Department (HOSPITAL_BASED_OUTPATIENT_CLINIC_OR_DEPARTMENT_OTHER): Payer: Medicare Other

## 2020-09-01 ENCOUNTER — Encounter (HOSPITAL_BASED_OUTPATIENT_CLINIC_OR_DEPARTMENT_OTHER): Payer: Self-pay | Admitting: Emergency Medicine

## 2020-09-01 ENCOUNTER — Other Ambulatory Visit: Payer: Self-pay

## 2020-09-01 DIAGNOSIS — G8929 Other chronic pain: Secondary | ICD-10-CM | POA: Diagnosis not present

## 2020-09-01 DIAGNOSIS — M545 Low back pain, unspecified: Secondary | ICD-10-CM | POA: Diagnosis not present

## 2020-09-01 DIAGNOSIS — C9000 Multiple myeloma not having achieved remission: Secondary | ICD-10-CM

## 2020-09-01 DIAGNOSIS — Z96651 Presence of right artificial knee joint: Secondary | ICD-10-CM | POA: Diagnosis not present

## 2020-09-01 DIAGNOSIS — M5459 Other low back pain: Secondary | ICD-10-CM | POA: Diagnosis not present

## 2020-09-01 DIAGNOSIS — I10 Essential (primary) hypertension: Secondary | ICD-10-CM | POA: Diagnosis not present

## 2020-09-01 DIAGNOSIS — Z8546 Personal history of malignant neoplasm of prostate: Secondary | ICD-10-CM | POA: Diagnosis not present

## 2020-09-01 DIAGNOSIS — Z96652 Presence of left artificial knee joint: Secondary | ICD-10-CM | POA: Diagnosis not present

## 2020-09-01 DIAGNOSIS — I2510 Atherosclerotic heart disease of native coronary artery without angina pectoris: Secondary | ICD-10-CM | POA: Insufficient documentation

## 2020-09-01 DIAGNOSIS — Z79899 Other long term (current) drug therapy: Secondary | ICD-10-CM | POA: Diagnosis not present

## 2020-09-01 DIAGNOSIS — Z8551 Personal history of malignant neoplasm of bladder: Secondary | ICD-10-CM | POA: Insufficient documentation

## 2020-09-01 DIAGNOSIS — Z87891 Personal history of nicotine dependence: Secondary | ICD-10-CM | POA: Insufficient documentation

## 2020-09-01 LAB — COMPREHENSIVE METABOLIC PANEL
ALT: 29 U/L (ref 0–44)
AST: 15 U/L (ref 15–41)
Albumin: 3.8 g/dL (ref 3.5–5.0)
Alkaline Phosphatase: 54 U/L (ref 38–126)
Anion gap: 8 (ref 5–15)
BUN: 21 mg/dL (ref 8–23)
CO2: 26 mmol/L (ref 22–32)
Calcium: 9.1 mg/dL (ref 8.9–10.3)
Chloride: 100 mmol/L (ref 98–111)
Creatinine, Ser: 0.87 mg/dL (ref 0.61–1.24)
GFR, Estimated: >60 mL/min (ref >60)
Glucose, Bld: 100 mg/dL — ABNORMAL HIGH (ref 70–99)
Potassium: 4.3 mmol/L (ref 3.5–5.1)
Sodium: 134 mmol/L — ABNORMAL LOW (ref 135–145)
Total Bilirubin: 0.4 mg/dL (ref 0.3–1.2)
Total Protein: 6.6 g/dL (ref 6.5–8.1)

## 2020-09-01 LAB — CBC WITH DIFFERENTIAL/PLATELET
Abs Immature Granulocytes: 0.02 10*3/uL (ref 0.00–0.07)
Basophils Absolute: 0 10*3/uL (ref 0.0–0.1)
Basophils Relative: 0 %
Eosinophils Absolute: 0.1 10*3/uL (ref 0.0–0.5)
Eosinophils Relative: 1 %
HCT: 33.3 % — ABNORMAL LOW (ref 39.0–52.0)
Hemoglobin: 11 g/dL — ABNORMAL LOW (ref 13.0–17.0)
Immature Granulocytes: 1 %
Lymphocytes Relative: 14 %
Lymphs Abs: 0.5 10*3/uL — ABNORMAL LOW (ref 0.7–4.0)
MCH: 30.8 pg (ref 26.0–34.0)
MCHC: 33 g/dL (ref 30.0–36.0)
MCV: 93.3 fL (ref 80.0–100.0)
Monocytes Absolute: 0.6 10*3/uL (ref 0.1–1.0)
Monocytes Relative: 15 %
Neutro Abs: 2.5 10*3/uL (ref 1.7–7.7)
Neutrophils Relative %: 69 %
Platelets: 132 10*3/uL — ABNORMAL LOW (ref 150–400)
RBC: 3.57 MIL/uL — ABNORMAL LOW (ref 4.22–5.81)
RDW: 15.1 % (ref 11.5–15.5)
WBC: 3.7 10*3/uL — ABNORMAL LOW (ref 4.0–10.5)
nRBC: 0 % (ref 0.0–0.2)

## 2020-09-01 MED ORDER — HYDROMORPHONE HCL 1 MG/ML IJ SOLN
1.0000 mg | Freq: Once | INTRAMUSCULAR | Status: AC
Start: 2020-09-01 — End: 2020-09-01
  Administered 2020-09-01: 1 mg via INTRAVENOUS
  Filled 2020-09-01: qty 1

## 2020-09-01 MED ORDER — ONDANSETRON HCL 4 MG/2ML IJ SOLN
4.0000 mg | Freq: Once | INTRAMUSCULAR | Status: AC
  Administered 2020-09-01: 4 mg via INTRAVENOUS
  Filled 2020-09-01: qty 2

## 2020-09-01 MED ORDER — OXYCODONE HCL 5 MG PO TABS
5.0000 mg | ORAL_TABLET | Freq: Four times a day (QID) | ORAL | 0 refills | Status: DC | PRN
  Filled 2020-09-01: qty 12, 3d supply, fill #0

## 2020-09-01 MED ORDER — HEPARIN SOD (PORK) LOCK FLUSH 100 UNIT/ML IV SOLN
500.0000 [IU] | Freq: Once | INTRAVENOUS | Status: AC
  Administered 2020-09-01: 500 [IU]
  Filled 2020-09-01: qty 5

## 2020-09-01 NOTE — ED Notes (Signed)
PT cursing and stating displeasure with [facility] and TV not working. PT informed there was nothing that could be done at this time about a signal issue.

## 2020-09-01 NOTE — ED Notes (Signed)
Adjusted BP cuff. BP 164/79.

## 2020-09-01 NOTE — ED Provider Notes (Signed)
[facility]ERGENCY DEPARTMENT Provider Note   CSN: [id number] Arrival date & time: 09/01/20  1008     History Chief Complaint  Patient presents with  . Back Pain    Derek Castro is a 80 y.o. male.  Patient with history of multiple myeloma.  It is not in remission.  Patient is followed by [facility] for this.  He does receive infusion therapy.  Was supposed to receive that today.  But has been having difficulty with increased back pain starting about 4 days ago.  It is worse on the left side of the back but it hurts more to raise the right leg.  No incontinence.  No numbness or weakness to the feet.  Does have some difficulty walking due to the pain.  Past medical history is significant for prior alcoholism.  And substance abuse.        Past Medical History:  Diagnosis Date  . Alcoholism in remission ([place])    [facility] three times per week as of 01/2017.  . Bladder cancer ([place]) 07/2019   07/10/2019 TURBT/gemcitabine instillation - LG, Ta, no muscle present  . Bone lesion   . Depression   . Fall 01/06/2018   pt fell wearing crocs while going up muddy embankment and hit head,no LOC went to ER no fractures pt sent home  . Family history of colon cancer in mother   . 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 Hyperlipidemia    Pt declined statin 08/2017  . Hypertension   . Insomnia    uses quetiapine for this  . Multiple myeloma ([place])    [facility]  . Osteoarthritis    C-spine--hx of fusion surgery.  R foot. Knees (hx of L TKA)  . Prediabetes   . Prostate cancer [facility])    [facility]    Patient Active Problem List   Diagnosis Date Noted  . SIRS (systemic inflammatory response syndrome) ([place]) 05/10/2020  . Hyperlipidemia   . Aortic atherosclerosis ([place])   . CAD (coronary artery disease)   . Sepsis ([place]) 05/09/2020  . Depression 03/03/2020  . Hypertension, essential  01/29/2020  . Bilateral arm pain 01/29/2020  . Fever and chills 01/29/2020  . Pneumonia 01/29/2020  . History of bladder cancer 01/29/2020  . 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 Bladder tumor 07/07/2019  . 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 Ganglion cyst 04/09/2017  . Erectile dysfunction 12/20/2016  . Osteoarthrosis, ankle and foot 06/03/2016  . Tibialis posterior dysfunction 06/03/2016  . Hallux valgus with bunions 06/03/2016  . Degenerative joint disease of ankle and foot, right 06/03/2016  . Difficult or painful urination 06/02/2016  . Malignant neoplasm of prostate ([place]) 03/09/2016  . History of prostate cancer 01/20/2016  . Bone lesion 08/12/2015  . Anemia due to chemotherapy 07/13/2015  . Pain in left knee 11/24/2014  . Multiple myeloma in relapse ([place]) 07/04/2014    Past Surgical History:  Procedure Laterality Date  . ANTERIOR FUSION CERVICAL SPINE  2002  . autologous stem cell transplant  2010 and 2012   [facility] in [place].  Marland Kitchen COLONOSCOPY  2015   Recall 5 yrs ([facility] in [place], [place])  . CYSTOSCOPY  07/05/2019   for gross hematuria; noninvasive-appearing tumor found on cystoscopy->plan for TURBT.  Marland Kitchen EXCISIONAL TOTAL KNEE  ARTHROPLASTY  2014 and 2016   Both knees  . LAPAROSCOPIC RETROPUBIC PROSTATECTOMY     + hx of radiation to pelvis.  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  Procedure: LEFT EXCISION MASS;  Surgeon: Leta Baptist, MD;  Location: [facility];  Service: ENT;  Laterality: Left;  . SKIN FULL THICKNESS GRAFT Left 01/31/2018   Procedure: SKIN GRAFT FULL THICKNESS;  Surgeon: Leta Baptist, MD;  Location: [facility];  Service: ENT;  Laterality: Left;  . TRANSURETHRAL RESECTION OF BLADDER TUMOR WITH GYRUS (TURBT-GYRUS)  07/10/2019   07/10/2019 TURBT/gemcitabine  instillation - LG, Ta, no muscle present       Family History  Problem Relation Age of Onset  . Cancer Mother        colon    Social History   Tobacco Use  . Smoking status: Former Smoker    Packs/day: 1.00    Years: 5.00    Pack years: 5.00    Types: Cigarettes    Quit date: 02/11/1996    Years since quitting: 24.5  . Smokeless tobacco: Never Used  Vaping Use  . Vaping Use: Never used  Substance Use Topics  . Alcohol use: No    Comment: last drink last summer  . Drug use: No    Home Medications Prior to Admission medications   Medication Sig Start Date End Date Taking? Authorizing Provider  oxyCODONE (ROXICODONE) 5 MG immediate release tablet Take 1 tablet (5 mg total) by mouth every 6 (six) hours as needed for severe pain. 09/01/20  Yes Fredia Sorrow, MD  acyclovir (ZOVIRAX) 400 MG tablet TAKE ONE TABLET BY MOUTH DAILY 06/14/19   McGowen, Adrian Blackwater, MD  amLODipine (NORVASC) 10 MG tablet TAKE ONE TABLET BY MOUTH DAILY 07/31/20   McGowen, Adrian Blackwater, MD  desonide (DESOWEN) 0.05 % ointment Apply topically.    [provider]  dexamethasone (DECADRON) 4 MG tablet TAKE TWO TABLETS BY MOUTH ONCE ON DAY 22 OF TREATMENT CYCLES 1-9 06/23/20   [provider]  fluticasone (CUTIVATE) 0.05 % cream Apply topically 2 (two) times daily. 05/15/20   McGowen, Adrian Blackwater, MD  ibuprofen (ADVIL) 800 MG tablet Take 800 mg by mouth every 8 (eight) hours as needed. 07/10/19   [provider]  lisinopril (ZESTRIL) 30 MG tablet Take 1 tablet (30 mg total) by mouth daily. 12/19/19   McGowen, Adrian Blackwater, MD  ondansetron (ZOFRAN) 8 MG tablet TAKE 1 TABLET (8 MG TOTAL) BY MOUTH EVERY 8 (EIGHT) HOURS AS NEEDED FOR NAUSEA FOR UP TO 30 DOSES. 01/18/16   [provider]  pantoprazole (PROTONIX) 40 MG tablet Take 40 mg by mouth daily. 03/17/20   [provider]  QUEtiapine (SEROQUEL) 100 MG tablet TAKE ONE TABLET BY MOUTH AT BEDTIME 08/21/20   McGowen, Adrian Blackwater, MD   sertraline (ZOLOFT) 50 MG tablet Take 50 mg by mouth daily. 03/09/20   [provider]  zolendronic acid (ZOMETA) 4 MG/5ML injection Inject into the vein. Patient not taking: Reported on 05/15/2020    [provider]  zolpidem (AMBIEN) 10 MG tablet Take 10 mg by mouth at bedtime as needed. 03/20/19   [provider]    Allergies    Tamiflu [oseltamivir]  Review of Systems   Review of Systems  Constitutional: Negative for chills and fever.  HENT: Negative for congestion, rhinorrhea and sore throat.   Eyes: Negative for visual disturbance.  Respiratory: Negative for  cough and shortness of breath.   Cardiovascular: Negative for chest pain and leg swelling.  Gastrointestinal: Negative for abdominal pain, diarrhea, nausea and vomiting.  Genitourinary: Negative for dysuria.  Musculoskeletal: Positive for back pain. Negative for neck pain.  Skin: Negative for rash.  Neurological: Negative for dizziness, weakness, light-headedness, numbness and headaches.  Hematological: Does not bruise/bleed easily.  Psychiatric/Behavioral: Negative for confusion.    Physical Exam Updated Vital Signs BP (!) 133/97   Pulse 64   Temp 98.3 F (36.8 C) (Oral)   Resp 18   Ht 1.753 m ('5\' 9"' )   Wt 113.4 kg   SpO2 98%   BMI 36.92 kg/m   Physical Exam Vitals and nursing note reviewed.  Constitutional:      General: He is not in acute distress.    Appearance: Normal appearance. He is well-developed.  HENT:     Head: Normocephalic and atraumatic.  Eyes:     Extraocular Movements: Extraocular movements intact.     Conjunctiva/sclera: Conjunctivae normal.  Cardiovascular:     Rate and Rhythm: Normal rate and regular rhythm.     Heart sounds: No murmur heard.   Pulmonary:     Effort: Pulmonary effort is normal. No respiratory distress.     Breath sounds: Normal breath sounds.  Abdominal:     Palpations: Abdomen is soft.     Tenderness: There is no abdominal  tenderness.  Musculoskeletal:        General: No swelling. Normal range of motion.     Cervical back: Neck supple.     Comments: No tenderness to palpation to the lumbar back area.  The patient subjectively states it hurts more on the left side.  No weakness to the toes or feet.  Able to raise both legs.  Some discomfort with raising the right leg into the left back area.  Sensation intact.  Dorsalis pedis pulses 1+.  Skin:    General: Skin is warm and dry.     Capillary Refill: Capillary refill takes 2 to 3 seconds.  Neurological:     Mental Status: He is alert.     Cranial Nerves: No cranial nerve deficit.     Sensory: No sensory deficit.     Motor: No weakness.     ED Results / Procedures / Treatments   Labs (all labs ordered are listed, but only abnormal results are displayed) Labs Reviewed  CBC WITH DIFFERENTIAL/PLATELET - Abnormal; Notable for the following components:      Result Value   WBC 3.7 (*)    RBC 3.57 (*)    Hemoglobin 11.0 (*)    HCT 33.3 (*)    Platelets 132 (*)    Lymphs Abs 0.5 (*)    All other components within normal limits  COMPREHENSIVE METABOLIC PANEL - Abnormal; Notable for the following components:   Sodium 134 (*)    Glucose, Bld 100 (*)    All other components within normal limits    EKG None  Radiology CT Lumbar Spine Wo Contrast  Result Date: 09/01/2020 CLINICAL DATA:  Back pain.  Multiple myeloma. EXAM: CT LUMBAR SPINE WITHOUT CONTRAST TECHNIQUE: Multidetector CT imaging of the lumbar spine was performed without intravenous contrast administration. Multiplanar CT image reconstructions were also generated. COMPARISON:  None. FINDINGS: Segmentation: Normal Alignment: Normal Vertebrae: Negative for vertebral fracture Hypodense lesion in the L1 vertebral body has fat density likely hemangioma. Small lytic lesions L3 vertebral body likely due to myeloma. Lytic lesion in the right posterior  iliac crest measuring 2.6 cm. 3 cm lesion left posterior  iliac crest. Multiple additional lesions in the iliac bone and sacrum bilaterally. Paraspinal and other soft tissues: Negative for paraspinous mass or adenopathy. Atherosclerotic calcification in the aorta. Multiple nonobstructing right renal calculi. Disc levels: T12-L1: Anterior spurring. Negative for spinal stenosis L1-2: Anterior left lateral spurring. Moderate facet degeneration. Mild foraminal narrowing bilaterally. L2-3: Disc degeneration with prominent left lateral spurring. Bilateral facet degeneration. Mild spinal stenosis and mild subarticular stenosis bilaterally L3-4: Disc degeneration with prominent annular calcification and endplate spurring. Bilateral facet hypertrophy. Moderate to severe spinal stenosis. Moderate subarticular stenosis bilaterally. L4-5: Disc degeneration with prominent endplate spurring and bilateral facet degeneration. Mild spinal stenosis. Moderate to severe subarticular stenosis bilaterally. Moderate foraminal stenosis bilaterally L5-S1: Disc degeneration with diffuse endplate spurring and bilateral facet hypertrophy. Moderate to severe subarticular and foraminal stenosis bilaterally. IMPRESSION: 1. Multiple lytic lesions in the sacrum and iliac bones bilaterally compatible with myeloma. Small lesions in the lumbar spine especially the L3 vertebral body. Negative for lumbar fracture 2. Advanced lumbar spondylosis. Multilevel spinal and foraminal stenosis due to spurring, most prominent L3-4, L4-5, L5-S1. Electronically Signed   By: Franchot Gallo M.D.   On: 09/01/2020 13:51    Procedures Procedures   Medications Ordered in ED Medications  heparin lock flush 100 unit/mL (has no administration in time range)  ondansetron (ZOFRAN) injection 4 mg (4 mg Intravenous Given 09/01/20 1419)  HYDROmorphone (DILAUDID) injection 1 mg (1 mg Intravenous Given 09/01/20 1418)    ED Course  I have reviewed the triage vital signs and the nursing notes.  Pertinent labs & imaging results  that were available during my care of the patient were reviewed by me and considered in my medical decision making (see chart for details).    MDM Rules/Calculators/A&P                          CT scan shows multiple lesions in the lumbar area and sacrum and pelvis.  All this could be responsible for his pain.  No evidence of any acute fractures.  There is some foraminal narrowing.  But patient not giving classic sciatic symptoms.  No numbness or weakness to his feet.  No concerns for cauda equina syndrome.  Patient improved here significantly with hydromorphone IV.  Will treat with a short course of oxycodone.  And follow-up with his hematologist oncologist.  His lab results and CT results printed for him to take to them.   Final Clinical Impression(s) / ED Diagnoses Final diagnoses:  Chronic left-sided low back pain without sciatica  Multiple myeloma not having achieved remission ([place])    Rx / DC Orders ED Discharge Orders         Ordered    oxyCODONE (ROXICODONE) 5 MG immediate release tablet  Every 6 hours PRN        09/01/20 1550           Fredia Sorrow, MD 09/01/20 1556

## 2020-09-01 NOTE — ED Triage Notes (Signed)
Patient presents with c/o left lower back pain for 4 days

## 2020-09-01 NOTE — ED Notes (Addendum)
Patient transported to CT 

## 2020-09-01 NOTE — Discharge Instructions (Addendum)
Follow-up with your hematologist oncologist that [place].  Take the pain medication as directed.  Results from your CAT scan have been printed for you to take to them.  There is evidence of multiple lesions in the lumbar area sacral area and pelvic area.  Most likely responsible for the pain.

## 2020-09-01 NOTE — ED Notes (Signed)
Pt had accident with urinal, bed linens and gown changed. Pt required standing assistance to stand, pt does move very slowly

## 2020-09-01 NOTE — ED Notes (Addendum)
Pt has hx of multiple myeloma, pt started new chemo last Tuesday.  Receives chemo weekly, pain has worsened since chemo last week.   Pt does have hx of bulging discs & RA

## 2020-09-04 ENCOUNTER — Other Ambulatory Visit: Payer: Self-pay | Admitting: Family Medicine

## 2020-09-04 ENCOUNTER — Encounter: Payer: Self-pay | Admitting: Family Medicine

## 2020-09-04 ENCOUNTER — Other Ambulatory Visit: Payer: Self-pay

## 2020-09-04 ENCOUNTER — Ambulatory Visit (INDEPENDENT_AMBULATORY_CARE_PROVIDER_SITE_OTHER): Payer: Medicare Other | Admitting: Family Medicine

## 2020-09-04 VITALS — BP 112/62 | HR 67 | Temp 98.0°F | Resp 16 | Ht 70.0 in | Wt 260.2 lb

## 2020-09-04 DIAGNOSIS — M545 Low back pain, unspecified: Secondary | ICD-10-CM | POA: Diagnosis not present

## 2020-09-04 DIAGNOSIS — I10 Essential (primary) hypertension: Secondary | ICD-10-CM | POA: Diagnosis not present

## 2020-09-04 MED ORDER — OXYCODONE HCL 5 MG PO TABS: 5.0000 mg | ORAL_TABLET | Freq: Four times a day (QID) | ORAL | 0 refills | Status: AC | PRN

## 2020-09-04 NOTE — Progress Notes (Signed)
OFFICE VISIT  09/04/2020  CC:  Chief Complaint  Patient presents with  . Pain meds in washing machine    Seen in ED on 4/5   HPI:    Patient is a [name] who presents for "pain meds in washing machine". Pt has multiple myeloma with known skeletal lytic lesions and is actively being [facility] hem[facility]onc[facility] infusion medication as well as oral selinexor. [facility] with increasing LB pain most prominent on L, CT imaging was done and showed multiple scattered bilat L/S spine and pelvic lytic lesions that were felt to possibly be the explanation of his pain.  He was given iv hydromorphine in ED and improved and was d/c'd home with oxycodone. In review of EMR in appears his hem-onc provider has arranged for him to get MRI of c-t-L spine tomorrow.  Was not on any pain meds prior to getting oxycodone at ED 3 d/a.  Had done some long car riding and slept on uncomfortable hotel bed, started getting LB pain--eventually went to ED as noted above. After home from ED he had taken about 4-5 oxycodone pills and said it helped signif when he took it, then the pain pills/bottle were accidentally run through the clothes washer yesterday.  Says all the pills in it must have dissolved.   Pain level currently is 3/10.  Took advil this morning around 7 hours ago. Located focally in L LB.  No leg weakness, no saddle anesthesia, no change in bowel/bladder control.  No rad down legs, no paresthesias down legs.  CT L/S spine w/out contrast 09/01/20: IMPRESSION: 1. Multiple lytic lesions in the sacrum and iliac bones bilaterally compatible with myeloma. Small lesions in the lumbar spine especially the L3 vertebral body. Negative for lumbar fracture 2. Advanced lumbar spondylosis. Multilevel spinal and foraminal stenosis due to spurring, most prominent L3-4, L4-5, L5-S1.  PMP AWARE reviewed today: most recent rx for oxycodone 47m was filled 09/01/20, # 12, rx by Dr.  SFredia Sorrow No red flags.   Past Medical History:  Diagnosis Date  . Alcoholism in remission ([place]    [facility] counseling three times per week as of 01/2017.  . Bladder cancer ([place] 07/2019   07/10/2019 TURBT/gemcitabine instillation - LG, Ta, no muscle present.  Followed by [facility]  . Bone lesion   . Depression   . Fall 01/06/2018   pt fell wearing crocs while going up muddy embankment and hit head,no LOC went to ER no fractures pt sent home  . Family history of colon cancer in mother   . 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 Hyperlipidemia    Pt declined statin 08/2017  . Hypertension   . Insomnia    uses quetiapine for this  . Multiple myeloma ([place]    [facility]  . Osteoarthritis    C-spine--hx of fusion surgery.  R foot. Knees (hx of L TKA)  . Prediabetes   . Prostate cancer ([facility]    [facility]    Past Surgical History:  Procedure Laterality Date  . ANTERIOR FUSION CERVICAL SPINE  2002  . [facility] [place]  .[place] Kitchen[place]ONOSCOPY  2015   [place] [place]  . CYSTOSCOPY  07/05/2019   for gross hematuria; noninvasive-appearing tumor found on cystoscopy->plan for  TURBT.  Marland Kitchen EXCISIONAL TOTAL KNEE ARTHROPLASTY  2014 and 2016   Both knees  . LAPAROSCOPIC RETROPUBIC PROSTATECTOMY     + hx of radiation to pelvis.  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  Procedure: LEFT EXCISION MASS;  Surgeon: Leta Baptist, MD;  Location: [facility];  Service: ENT;  Laterality: Left;  . SKIN FULL THICKNESS GRAFT Left 01/31/2018   Procedure: SKIN GRAFT FULL THICKNESS;  Surgeon: Leta Baptist, MD;  Location: [facility];  Service: ENT;  Laterality: Left;  . TRANSURETHRAL RESECTION OF BLADDER TUMOR WITH GYRUS (TURBT-GYRUS)  07/10/2019   07/10/2019  TURBT/gemcitabine instillation - LG, Ta, no muscle present    Outpatient Medications Prior to Visit  Medication Sig Dispense Refill  . acyclovir (ZOVIRAX) 400 MG tablet TAKE ONE TABLET BY MOUTH DAILY 30 tablet 6  . amLODipine (NORVASC) 10 MG tablet TAKE ONE TABLET BY MOUTH DAILY 30 tablet 0  . desonide (DESOWEN) 0.05 % ointment Apply topically.    Marland Kitchen dexamethasone (DECADRON) 4 MG tablet TAKE TWO TABLETS BY MOUTH ONCE ON DAY 22 OF TREATMENT CYCLES 1-9    . fluticasone (CUTIVATE) 0.05 % cream Apply topically 2 (two) times daily. 60 g 1  . ibuprofen (ADVIL) 800 MG tablet Take 800 mg by mouth every 8 (eight) hours as needed.    Marland Kitchen lisinopril (ZESTRIL) 30 MG tablet Take 1 tablet (30 mg total) by mouth daily. 90 tablet 3  . ondansetron (ZOFRAN) 8 MG tablet TAKE 1 TABLET (8 MG TOTAL) BY MOUTH EVERY 8 (EIGHT) HOURS AS NEEDED FOR NAUSEA FOR UP TO 30 DOSES.  3  . pantoprazole (PROTONIX) 40 MG tablet Take 40 mg by mouth daily.    . QUEtiapine (SEROQUEL) 100 MG tablet TAKE ONE TABLET BY MOUTH AT BEDTIME 30 tablet 0  . sertraline (ZOLOFT) 50 MG tablet Take 50 mg by mouth daily.    Marland Kitchen zolpidem (AMBIEN) 10 MG tablet Take 10 mg by mouth at bedtime as needed.    Marland Kitchen oxyCODONE (ROXICODONE) 5 MG immediate release tablet Take 1 tablet (5 mg total) by mouth every 6 (six) hours as needed for severe pain. 12 tablet 0  . zolendronic acid (ZOMETA) 4 MG/5ML injection Inject into the vein. (Patient not taking: No sig reported)     No facility-administered medications prior to visit.    Allergies  Allergen Reactions  . Tamiflu [Oseltamivir] Nausea And Vomiting    ROS As per HPI  PE: Vitals with BMI 09/04/2020 09/01/2020 09/01/2020  Height _0  - -  Weight 260 lbs 3 oz - -  BMI 42.70 - -  Systolic [phone]  Diastolic 62 75 97  Pulse 67 68 64   Gen: Alert, well appearing.  Patient is oriented to person, place, time, and situation. AFFECT: pleasant, lucid thought and speech. ROM intact. Focal L lower lumbar  TTP around facet joint region. Sitting SLR neg bilat. LE strength 5/5 prox/dist bilat.  Cannot elicit DTRs in patellar or achilles areas either side. No sensory deficit.  LABS:  None today  IMPRESSION AND PLAN:  1) Acute left sided low back pain: Abnl CT imaging as noted in HPI above. Question of whether the pain is coming from lytic bone lesions of his multiple myeloma or from aggravated lumbosacral spondylosis. He ran his pain med through the washer.  I don't have any unusual suspicion that pt is seeking opioids or diverting meds, etc. Plan: I rf'd #  12 oxycodone.  [facility]. May need PT if not signif improving in next 1-2 weeks.  2) HTN: stable on lisinopril 55m qd and amlodipine 133mqd. [facility]: all normal, including sCr 1.0 and K+ 4.3. No changes today.  Spent 32 min with pt today reviewing HPI, reviewing relevant past history, doing exam, reviewing and discussing lab and imaging data, and formulating plans.  An After Visit Summary was printed and given to the patient.  FOLLOW UP: Return if symptoms worsen or fail to improve.  Signed:  PhCrissie SicklesMD           09/04/2020

## 2020-09-05 DIAGNOSIS — G959 Disease of spinal cord, unspecified: Secondary | ICD-10-CM | POA: Diagnosis not present

## 2020-09-05 DIAGNOSIS — C9000 Multiple myeloma not having achieved remission: Secondary | ICD-10-CM | POA: Diagnosis not present

## 2020-09-08 DIAGNOSIS — C9002 Multiple myeloma in relapse: Secondary | ICD-10-CM | POA: Diagnosis not present

## 2020-09-08 DIAGNOSIS — M899 Disorder of bone, unspecified: Secondary | ICD-10-CM | POA: Diagnosis not present

## 2020-09-08 DIAGNOSIS — M19072 Primary osteoarthritis, left ankle and foot: Secondary | ICD-10-CM | POA: Diagnosis not present

## 2020-09-10 ENCOUNTER — Telehealth: Payer: Self-pay

## 2020-09-10 NOTE — Telephone Encounter (Signed)
Pt called to find out if he needs to have colonoscopy done. Pt states it was never discussed with PCP. Does pt need to have CPE or could it be an appt just for colonoscopy discussion?   Please advise,   Pt last seen on 09/04/20

## 2020-09-14 DIAGNOSIS — L03031 Cellulitis of right toe: Secondary | ICD-10-CM | POA: Diagnosis not present

## 2020-09-14 NOTE — Telephone Encounter (Signed)
Given his age and overall clinical status at this time I don't recommend he get a colonoscopy at this point.

## 2020-09-14 NOTE — Telephone Encounter (Signed)
LVM for pt to call for further discussion

## 2020-09-15 ENCOUNTER — Ambulatory Visit: Payer: Medicare Other | Admitting: Internal Medicine

## 2020-09-17 DIAGNOSIS — L988 Other specified disorders of the skin and subcutaneous tissue: Secondary | ICD-10-CM | POA: Diagnosis not present

## 2020-09-17 DIAGNOSIS — R454 Irritability and anger: Secondary | ICD-10-CM | POA: Diagnosis not present

## 2020-09-17 DIAGNOSIS — Z79899 Other long term (current) drug therapy: Secondary | ICD-10-CM | POA: Diagnosis not present

## 2020-09-17 DIAGNOSIS — M79602 Pain in left arm: Secondary | ICD-10-CM | POA: Diagnosis not present

## 2020-09-17 DIAGNOSIS — Z961 Presence of intraocular lens: Secondary | ICD-10-CM | POA: Diagnosis not present

## 2020-09-17 DIAGNOSIS — C9000 Multiple myeloma not having achieved remission: Secondary | ICD-10-CM | POA: Diagnosis not present

## 2020-09-17 DIAGNOSIS — M5441 Lumbago with sciatica, right side: Secondary | ICD-10-CM | POA: Diagnosis not present

## 2020-09-17 DIAGNOSIS — M542 Cervicalgia: Secondary | ICD-10-CM | POA: Diagnosis not present

## 2020-09-17 DIAGNOSIS — Z5112 Encounter for antineoplastic immunotherapy: Secondary | ICD-10-CM | POA: Diagnosis not present

## 2020-09-17 DIAGNOSIS — C9002 Multiple myeloma in relapse: Secondary | ICD-10-CM | POA: Diagnosis not present

## 2020-09-17 DIAGNOSIS — R197 Diarrhea, unspecified: Secondary | ICD-10-CM | POA: Diagnosis not present

## 2020-09-17 DIAGNOSIS — R109 Unspecified abdominal pain: Secondary | ICD-10-CM | POA: Diagnosis not present

## 2020-09-17 DIAGNOSIS — Z8551 Personal history of malignant neoplasm of bladder: Secondary | ICD-10-CM | POA: Diagnosis not present

## 2020-09-17 DIAGNOSIS — M549 Dorsalgia, unspecified: Secondary | ICD-10-CM | POA: Diagnosis not present

## 2020-09-17 DIAGNOSIS — I10 Essential (primary) hypertension: Secondary | ICD-10-CM | POA: Diagnosis not present

## 2020-09-17 DIAGNOSIS — R29898 Other symptoms and signs involving the musculoskeletal system: Secondary | ICD-10-CM | POA: Diagnosis not present

## 2020-09-17 DIAGNOSIS — Z8546 Personal history of malignant neoplasm of prostate: Secondary | ICD-10-CM | POA: Diagnosis not present

## 2020-09-17 DIAGNOSIS — Z923 Personal history of irradiation: Secondary | ICD-10-CM | POA: Diagnosis not present

## 2020-09-22 ENCOUNTER — Other Ambulatory Visit: Payer: Self-pay | Admitting: Family Medicine

## 2020-10-07 ENCOUNTER — Other Ambulatory Visit: Payer: Self-pay

## 2020-10-08 DIAGNOSIS — Z923 Personal history of irradiation: Secondary | ICD-10-CM | POA: Diagnosis not present

## 2020-10-08 DIAGNOSIS — M549 Dorsalgia, unspecified: Secondary | ICD-10-CM | POA: Diagnosis not present

## 2020-10-08 DIAGNOSIS — Z79899 Other long term (current) drug therapy: Secondary | ICD-10-CM | POA: Diagnosis not present

## 2020-10-08 DIAGNOSIS — C9000 Multiple myeloma not having achieved remission: Secondary | ICD-10-CM | POA: Diagnosis not present

## 2020-10-08 DIAGNOSIS — Z5112 Encounter for antineoplastic immunotherapy: Secondary | ICD-10-CM | POA: Diagnosis not present

## 2020-10-08 DIAGNOSIS — L729 Follicular cyst of the skin and subcutaneous tissue, unspecified: Secondary | ICD-10-CM | POA: Diagnosis not present

## 2020-10-08 DIAGNOSIS — I10 Essential (primary) hypertension: Secondary | ICD-10-CM | POA: Diagnosis not present

## 2020-10-08 DIAGNOSIS — Z8551 Personal history of malignant neoplasm of bladder: Secondary | ICD-10-CM | POA: Diagnosis not present

## 2020-10-08 DIAGNOSIS — R197 Diarrhea, unspecified: Secondary | ICD-10-CM | POA: Diagnosis not present

## 2020-10-08 DIAGNOSIS — M79601 Pain in right arm: Secondary | ICD-10-CM | POA: Diagnosis not present

## 2020-10-08 DIAGNOSIS — R109 Unspecified abdominal pain: Secondary | ICD-10-CM | POA: Diagnosis not present

## 2020-10-08 DIAGNOSIS — M79602 Pain in left arm: Secondary | ICD-10-CM | POA: Diagnosis not present

## 2020-10-08 DIAGNOSIS — G8929 Other chronic pain: Secondary | ICD-10-CM | POA: Diagnosis not present

## 2020-10-08 DIAGNOSIS — R220 Localized swelling, mass and lump, head: Secondary | ICD-10-CM | POA: Diagnosis not present

## 2020-10-08 DIAGNOSIS — Z981 Arthrodesis status: Secondary | ICD-10-CM | POA: Diagnosis not present

## 2020-10-08 DIAGNOSIS — H16143 Punctate keratitis, bilateral: Secondary | ICD-10-CM | POA: Diagnosis not present

## 2020-10-08 DIAGNOSIS — Z8546 Personal history of malignant neoplasm of prostate: Secondary | ICD-10-CM | POA: Diagnosis not present

## 2020-10-08 DIAGNOSIS — R531 Weakness: Secondary | ICD-10-CM | POA: Diagnosis not present

## 2020-10-09 ENCOUNTER — Ambulatory Visit: Payer: Medicare Other | Admitting: Family Medicine

## 2020-10-09 ENCOUNTER — Telehealth: Payer: Self-pay

## 2020-10-09 DIAGNOSIS — C674 Malignant neoplasm of posterior wall of bladder: Secondary | ICD-10-CM | POA: Diagnosis not present

## 2020-10-09 NOTE — Progress Notes (Deleted)
OFFICE VISIT  10/09/2020  CC: No chief complaint on file.   HPI:    Patient is a [name] who presents for "knot on head".   Past Medical History:  Diagnosis Date  . Alcoholism in remission ([place])    [facility] counseling three times per week as of 01/2017.  . Bladder cancer ([place]) 07/2019   07/10/2019 TURBT/gemcitabine instillation - LG, Ta, no muscle present.  Followed by [facility].  . Bone lesion   . Depression   . Fall 01/06/2018   pt fell wearing crocs while going up muddy embankment and hit head,no LOC went to ER no fractures pt sent home  . Family history of colon cancer in mother   . 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 Hyperlipidemia    Pt declined statin 08/2017  . Hypertension   . Insomnia    uses quetiapine for this  . Multiple myeloma ([place])    [facility]  . Osteoarthritis    C-spine--hx of fusion surgery.  R foot. Knees (hx of L TKA)  . Prediabetes   . Prostate cancer [facility])    [facility]    Past Surgical History:  Procedure Laterality Date  . ANTERIOR FUSION CERVICAL SPINE  2002  . autologous stem cell transplant  2010 and 2012   [facility] in [place].  Marland Kitchen COLONOSCOPY  2015   Recall 5 yrs ([facility] in [place], [place])  . CYSTOSCOPY  07/05/2019   for gross hematuria; noninvasive-appearing tumor found on cystoscopy->plan for TURBT.  Marland Kitchen EXCISIONAL TOTAL KNEE ARTHROPLASTY  2014 and 2016   Both knees  . LAPAROSCOPIC RETROPUBIC PROSTATECTOMY     + hx of radiation to pelvis.  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  Procedure: LEFT EXCISION MASS;  Surgeon: Leta Baptist, MD;  Location: [place];  Service: ENT;  Laterality: Left;  . SKIN FULL THICKNESS GRAFT Left 01/31/2018   Procedure: SKIN GRAFT FULL THICKNESS;  Surgeon: Leta Baptist, MD;  Location: [place];  Service: ENT;  Laterality:  Left;  . 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 acyclovir (ZOVIRAX) 400 MG tablet TAKE ONE TABLET BY MOUTH DAILY 30 tablet 6  . amLODipine (NORVASC) 10 MG tablet TAKE ONE TABLET BY MOUTH EVERY DAY 90 tablet 0  . clindamycin (CLEOCIN) 150 MG capsule Take 150 mg by mouth 3 (three) times daily.    Marland Kitchen desonide (DESOWEN) 0.05 % ointment Apply topically.    Marland Kitchen dexamethasone (DECADRON) 4 MG tablet TAKE TWO TABLETS BY MOUTH ONCE ON DAY 22 OF TREATMENT CYCLES 1-9    . fluticasone (CUTIVATE) 0.05 % cream Apply topically 2 (two) times daily. 60 g 1  . ibuprofen (ADVIL) 800 MG tablet Take 800 mg by mouth every 8 (eight) hours as needed.    Marland Kitchen lisinopril (ZESTRIL) 30 MG tablet Take 1 tablet (30 mg total) by mouth daily. 90 tablet 3  . ondansetron (ZOFRAN) 8 MG tablet TAKE 1 TABLET (8 MG TOTAL) BY MOUTH EVERY 8 (EIGHT) HOURS AS NEEDED FOR NAUSEA FOR UP TO 30 DOSES.  3  . oxyCODONE (ROXICODONE) 5 MG immediate release tablet Take 1 tablet (5 mg total) by mouth every 6 (  six) hours as needed for severe pain. 12 tablet 0  . Oxycodone HCl 10 MG TABS Take 10 mg by mouth every 6 (six) hours as needed.    . pantoprazole (PROTONIX) 40 MG tablet Take 40 mg by mouth daily.    . QUEtiapine (SEROQUEL) 100 MG tablet TAKE ONE TABLET BY MOUTH AT BEDTIME 30 tablet 0  . sertraline (ZOLOFT) 50 MG tablet Take 50 mg by mouth daily.    Marland Kitchen zolendronic acid (ZOMETA) 4 MG/5ML injection Inject into the vein. (Patient not taking: No sig reported)    . zolpidem (AMBIEN) 10 MG tablet Take 10 mg by mouth at bedtime as needed.     No facility-administered medications prior to visit.    Allergies  Allergen Reactions  . Tamiflu [Oseltamivir] Nausea And Vomiting    ROS As per HPI  PE: Vitals with BMI 09/04/2020 09/01/2020 09/01/2020  Height '5\' 10"'  - -   Weight 260 lbs 3 oz - -  BMI 10.93 - -  Systolic 235 573 220  Diastolic 62 75 97  Pulse 67 68 64     ***  LABS:    Chemistry      Component Value Date/Time   NA 134 (L) 09/01/2020 1422   NA 135 (A) 06/12/2019 0000   K 4.3 09/01/2020 1422   CL 100 09/01/2020 1422   CO2 26 09/01/2020 1422   BUN 21 09/01/2020 1422   BUN 27 (A) 06/12/2019 0000   CREATININE 0.87 09/01/2020 1422   GLU 93 06/12/2019 0000      Component Value Date/Time   CALCIUM 9.1 09/01/2020 1422   ALKPHOS 54 09/01/2020 1422   AST 15 09/01/2020 1422   ALT 29 09/01/2020 1422   BILITOT 0.4 09/01/2020 1422     Lab Results  Component Value Date   WBC 3.7 (L) 09/01/2020   HGB 11.0 (L) 09/01/2020   HCT 33.3 (L) 09/01/2020   MCV 93.3 09/01/2020   PLT 132 (L) 09/01/2020   IMPRESSION AND PLAN:  No problem-specific Assessment & Plan notes found for this encounter.   An After Visit Summary was printed and given to the patient.  FOLLOW UP: No follow-ups on file.  Signed:  Crissie Sickles, MD           10/09/2020

## 2020-10-09 NOTE — Telephone Encounter (Signed)
Appt cancelled  [place] Night - Client Nonclinical Telephone Record  [facility] Client [place] Night - Client Client Site [place] Night Physician Crissie Sickles - MD Contact Type Call Who Is Calling Patient / Member / Family / Caregiver Caller Name [place] Phone Number [phone] Patient Name Derek Castro Patient DOB [birth year] Call Type Message Only Information Provided Reason for Call Request to [facility] Appointment Initial Comment Caller he needs to cancel his 3pm appointment. Patient request to speak to RN No Additional Comment Please call. Disp. Time Disposition Final User 10/09/2020 6:30:11 AM General Information Provided Yes Clydene Laming, Amy Call Closed By: [place] Transaction Date/Time: 10/09/2020 6:28:17 AM (ET)

## 2020-10-16 ENCOUNTER — Telehealth: Payer: Self-pay | Admitting: Family Medicine

## 2020-10-16 NOTE — Telephone Encounter (Signed)
Patient mailbox full.  Called patient to schedule Annual Wellness Visit.  Please schedule with Nurse Health Advisor Caroleen Hamman, RN at [facility].

## 2020-10-20 DIAGNOSIS — N390 Urinary tract infection, site not specified: Secondary | ICD-10-CM | POA: Diagnosis not present

## 2020-10-20 DIAGNOSIS — R300 Dysuria: Secondary | ICD-10-CM | POA: Diagnosis not present

## 2020-10-20 DIAGNOSIS — R310 Gross hematuria: Secondary | ICD-10-CM | POA: Diagnosis not present

## 2020-10-20 DIAGNOSIS — D303 Benign neoplasm of bladder: Secondary | ICD-10-CM | POA: Diagnosis not present

## 2020-10-20 DIAGNOSIS — C61 Malignant neoplasm of prostate: Secondary | ICD-10-CM | POA: Diagnosis not present

## 2020-10-20 DIAGNOSIS — R972 Elevated prostate specific antigen [PSA]: Secondary | ICD-10-CM | POA: Diagnosis not present

## 2020-10-20 DIAGNOSIS — N529 Male erectile dysfunction, unspecified: Secondary | ICD-10-CM | POA: Diagnosis not present

## 2020-10-22 DIAGNOSIS — G893 Neoplasm related pain (acute) (chronic): Secondary | ICD-10-CM | POA: Diagnosis not present

## 2020-10-22 DIAGNOSIS — L989 Disorder of the skin and subcutaneous tissue, unspecified: Secondary | ICD-10-CM | POA: Diagnosis not present

## 2020-10-22 DIAGNOSIS — C9002 Multiple myeloma in relapse: Secondary | ICD-10-CM | POA: Diagnosis not present

## 2020-10-22 DIAGNOSIS — Z8546 Personal history of malignant neoplasm of prostate: Secondary | ICD-10-CM | POA: Diagnosis not present

## 2020-10-22 DIAGNOSIS — D801 Nonfamilial hypogammaglobulinemia: Secondary | ICD-10-CM | POA: Diagnosis not present

## 2020-10-22 DIAGNOSIS — C679 Malignant neoplasm of bladder, unspecified: Secondary | ICD-10-CM | POA: Diagnosis not present

## 2020-10-26 DIAGNOSIS — I10 Essential (primary) hypertension: Secondary | ICD-10-CM | POA: Diagnosis not present

## 2020-10-26 DIAGNOSIS — C7951 Secondary malignant neoplasm of bone: Secondary | ICD-10-CM | POA: Diagnosis not present

## 2020-10-26 DIAGNOSIS — C9002 Multiple myeloma in relapse: Secondary | ICD-10-CM | POA: Diagnosis not present

## 2020-10-26 DIAGNOSIS — M1380 Other specified arthritis, unspecified site: Secondary | ICD-10-CM | POA: Diagnosis not present

## 2020-10-26 DIAGNOSIS — Z8551 Personal history of malignant neoplasm of bladder: Secondary | ICD-10-CM | POA: Diagnosis not present

## 2020-10-26 DIAGNOSIS — Z8546 Personal history of malignant neoplasm of prostate: Secondary | ICD-10-CM | POA: Diagnosis not present

## 2020-10-26 DIAGNOSIS — N400 Enlarged prostate without lower urinary tract symptoms: Secondary | ICD-10-CM | POA: Diagnosis not present

## 2020-10-27 DIAGNOSIS — I10 Essential (primary) hypertension: Secondary | ICD-10-CM | POA: Diagnosis not present

## 2020-10-27 DIAGNOSIS — M1380 Other specified arthritis, unspecified site: Secondary | ICD-10-CM | POA: Diagnosis not present

## 2020-10-27 DIAGNOSIS — C7951 Secondary malignant neoplasm of bone: Secondary | ICD-10-CM | POA: Diagnosis not present

## 2020-10-27 DIAGNOSIS — C9002 Multiple myeloma in relapse: Secondary | ICD-10-CM | POA: Diagnosis not present

## 2020-10-27 DIAGNOSIS — Z8546 Personal history of malignant neoplasm of prostate: Secondary | ICD-10-CM | POA: Diagnosis not present

## 2020-10-27 DIAGNOSIS — N400 Enlarged prostate without lower urinary tract symptoms: Secondary | ICD-10-CM | POA: Diagnosis not present

## 2020-10-28 DIAGNOSIS — I10 Essential (primary) hypertension: Secondary | ICD-10-CM | POA: Diagnosis not present

## 2020-10-28 DIAGNOSIS — N400 Enlarged prostate without lower urinary tract symptoms: Secondary | ICD-10-CM | POA: Diagnosis not present

## 2020-10-28 DIAGNOSIS — Z8546 Personal history of malignant neoplasm of prostate: Secondary | ICD-10-CM | POA: Diagnosis not present

## 2020-10-28 DIAGNOSIS — C7951 Secondary malignant neoplasm of bone: Secondary | ICD-10-CM | POA: Diagnosis not present

## 2020-10-28 DIAGNOSIS — Z8551 Personal history of malignant neoplasm of bladder: Secondary | ICD-10-CM | POA: Diagnosis not present

## 2020-10-28 DIAGNOSIS — C9002 Multiple myeloma in relapse: Secondary | ICD-10-CM | POA: Diagnosis not present

## 2020-10-28 DIAGNOSIS — M1380 Other specified arthritis, unspecified site: Secondary | ICD-10-CM | POA: Diagnosis not present

## 2020-11-03 DIAGNOSIS — N400 Enlarged prostate without lower urinary tract symptoms: Secondary | ICD-10-CM | POA: Diagnosis not present

## 2020-11-03 DIAGNOSIS — C7951 Secondary malignant neoplasm of bone: Secondary | ICD-10-CM | POA: Diagnosis not present

## 2020-11-03 DIAGNOSIS — C9002 Multiple myeloma in relapse: Secondary | ICD-10-CM | POA: Diagnosis not present

## 2020-11-03 DIAGNOSIS — I10 Essential (primary) hypertension: Secondary | ICD-10-CM | POA: Diagnosis not present

## 2020-11-03 DIAGNOSIS — M1380 Other specified arthritis, unspecified site: Secondary | ICD-10-CM | POA: Diagnosis not present

## 2020-11-03 DIAGNOSIS — Z8546 Personal history of malignant neoplasm of prostate: Secondary | ICD-10-CM | POA: Diagnosis not present

## 2020-11-04 DIAGNOSIS — I10 Essential (primary) hypertension: Secondary | ICD-10-CM | POA: Diagnosis not present

## 2020-11-04 DIAGNOSIS — Z8546 Personal history of malignant neoplasm of prostate: Secondary | ICD-10-CM | POA: Diagnosis not present

## 2020-11-04 DIAGNOSIS — C7951 Secondary malignant neoplasm of bone: Secondary | ICD-10-CM | POA: Diagnosis not present

## 2020-11-04 DIAGNOSIS — M1380 Other specified arthritis, unspecified site: Secondary | ICD-10-CM | POA: Diagnosis not present

## 2020-11-04 DIAGNOSIS — N400 Enlarged prostate without lower urinary tract symptoms: Secondary | ICD-10-CM | POA: Diagnosis not present

## 2020-11-04 DIAGNOSIS — C9002 Multiple myeloma in relapse: Secondary | ICD-10-CM | POA: Diagnosis not present

## 2020-11-05 DIAGNOSIS — N281 Cyst of kidney, acquired: Secondary | ICD-10-CM | POA: Diagnosis not present

## 2020-11-05 DIAGNOSIS — Z87442 Personal history of urinary calculi: Secondary | ICD-10-CM | POA: Diagnosis not present

## 2020-11-05 DIAGNOSIS — M549 Dorsalgia, unspecified: Secondary | ICD-10-CM | POA: Diagnosis not present

## 2020-11-05 DIAGNOSIS — R1030 Lower abdominal pain, unspecified: Secondary | ICD-10-CM | POA: Diagnosis not present

## 2020-11-05 DIAGNOSIS — C679 Malignant neoplasm of bladder, unspecified: Secondary | ICD-10-CM | POA: Diagnosis not present

## 2020-11-05 DIAGNOSIS — N200 Calculus of kidney: Secondary | ICD-10-CM | POA: Diagnosis not present

## 2020-11-05 DIAGNOSIS — R310 Gross hematuria: Secondary | ICD-10-CM | POA: Diagnosis not present

## 2020-11-05 DIAGNOSIS — N390 Urinary tract infection, site not specified: Secondary | ICD-10-CM | POA: Diagnosis not present

## 2020-11-05 DIAGNOSIS — Z8546 Personal history of malignant neoplasm of prostate: Secondary | ICD-10-CM | POA: Diagnosis not present

## 2020-11-06 DIAGNOSIS — C9002 Multiple myeloma in relapse: Secondary | ICD-10-CM | POA: Diagnosis not present

## 2020-11-06 DIAGNOSIS — N400 Enlarged prostate without lower urinary tract symptoms: Secondary | ICD-10-CM | POA: Diagnosis not present

## 2020-11-06 DIAGNOSIS — Z8546 Personal history of malignant neoplasm of prostate: Secondary | ICD-10-CM | POA: Diagnosis not present

## 2020-11-06 DIAGNOSIS — C7951 Secondary malignant neoplasm of bone: Secondary | ICD-10-CM | POA: Diagnosis not present

## 2020-11-06 DIAGNOSIS — I10 Essential (primary) hypertension: Secondary | ICD-10-CM | POA: Diagnosis not present

## 2020-11-06 DIAGNOSIS — M1380 Other specified arthritis, unspecified site: Secondary | ICD-10-CM | POA: Diagnosis not present

## 2020-11-10 DIAGNOSIS — C7951 Secondary malignant neoplasm of bone: Secondary | ICD-10-CM | POA: Diagnosis not present

## 2020-11-10 DIAGNOSIS — C9002 Multiple myeloma in relapse: Secondary | ICD-10-CM | POA: Diagnosis not present

## 2020-11-10 DIAGNOSIS — I10 Essential (primary) hypertension: Secondary | ICD-10-CM | POA: Diagnosis not present

## 2020-11-10 DIAGNOSIS — M1380 Other specified arthritis, unspecified site: Secondary | ICD-10-CM | POA: Diagnosis not present

## 2020-11-10 DIAGNOSIS — N400 Enlarged prostate without lower urinary tract symptoms: Secondary | ICD-10-CM | POA: Diagnosis not present

## 2020-11-10 DIAGNOSIS — Z8546 Personal history of malignant neoplasm of prostate: Secondary | ICD-10-CM | POA: Diagnosis not present

## 2020-11-11 DIAGNOSIS — C7951 Secondary malignant neoplasm of bone: Secondary | ICD-10-CM | POA: Diagnosis not present

## 2020-11-11 DIAGNOSIS — M1380 Other specified arthritis, unspecified site: Secondary | ICD-10-CM | POA: Diagnosis not present

## 2020-11-11 DIAGNOSIS — Z8546 Personal history of malignant neoplasm of prostate: Secondary | ICD-10-CM | POA: Diagnosis not present

## 2020-11-11 DIAGNOSIS — I10 Essential (primary) hypertension: Secondary | ICD-10-CM | POA: Diagnosis not present

## 2020-11-11 DIAGNOSIS — C9002 Multiple myeloma in relapse: Secondary | ICD-10-CM | POA: Diagnosis not present

## 2020-11-11 DIAGNOSIS — N400 Enlarged prostate without lower urinary tract symptoms: Secondary | ICD-10-CM | POA: Diagnosis not present

## 2020-11-13 DIAGNOSIS — N400 Enlarged prostate without lower urinary tract symptoms: Secondary | ICD-10-CM | POA: Diagnosis not present

## 2020-11-13 DIAGNOSIS — I10 Essential (primary) hypertension: Secondary | ICD-10-CM | POA: Diagnosis not present

## 2020-11-13 DIAGNOSIS — C9002 Multiple myeloma in relapse: Secondary | ICD-10-CM | POA: Diagnosis not present

## 2020-11-13 DIAGNOSIS — M1380 Other specified arthritis, unspecified site: Secondary | ICD-10-CM | POA: Diagnosis not present

## 2020-11-13 DIAGNOSIS — C7951 Secondary malignant neoplasm of bone: Secondary | ICD-10-CM | POA: Diagnosis not present

## 2020-11-13 DIAGNOSIS — Z8546 Personal history of malignant neoplasm of prostate: Secondary | ICD-10-CM | POA: Diagnosis not present

## 2020-11-16 DIAGNOSIS — R972 Elevated prostate specific antigen [PSA]: Secondary | ICD-10-CM | POA: Diagnosis not present

## 2020-11-16 DIAGNOSIS — C61 Malignant neoplasm of prostate: Secondary | ICD-10-CM | POA: Diagnosis not present

## 2020-11-17 DIAGNOSIS — I10 Essential (primary) hypertension: Secondary | ICD-10-CM | POA: Diagnosis not present

## 2020-11-17 DIAGNOSIS — C9002 Multiple myeloma in relapse: Secondary | ICD-10-CM | POA: Diagnosis not present

## 2020-11-17 DIAGNOSIS — N400 Enlarged prostate without lower urinary tract symptoms: Secondary | ICD-10-CM | POA: Diagnosis not present

## 2020-11-17 DIAGNOSIS — Z8546 Personal history of malignant neoplasm of prostate: Secondary | ICD-10-CM | POA: Diagnosis not present

## 2020-11-17 DIAGNOSIS — M1380 Other specified arthritis, unspecified site: Secondary | ICD-10-CM | POA: Diagnosis not present

## 2020-11-17 DIAGNOSIS — C7951 Secondary malignant neoplasm of bone: Secondary | ICD-10-CM | POA: Diagnosis not present

## 2020-11-24 DIAGNOSIS — I10 Essential (primary) hypertension: Secondary | ICD-10-CM | POA: Diagnosis not present

## 2020-11-24 DIAGNOSIS — N400 Enlarged prostate without lower urinary tract symptoms: Secondary | ICD-10-CM | POA: Diagnosis not present

## 2020-11-24 DIAGNOSIS — C9002 Multiple myeloma in relapse: Secondary | ICD-10-CM | POA: Diagnosis not present

## 2020-11-24 DIAGNOSIS — C7951 Secondary malignant neoplasm of bone: Secondary | ICD-10-CM | POA: Diagnosis not present

## 2020-11-24 DIAGNOSIS — M1380 Other specified arthritis, unspecified site: Secondary | ICD-10-CM | POA: Diagnosis not present

## 2020-11-24 DIAGNOSIS — Z8546 Personal history of malignant neoplasm of prostate: Secondary | ICD-10-CM | POA: Diagnosis not present

## 2020-11-27 DIAGNOSIS — Z8551 Personal history of malignant neoplasm of bladder: Secondary | ICD-10-CM | POA: Diagnosis not present

## 2020-11-27 DIAGNOSIS — N400 Enlarged prostate without lower urinary tract symptoms: Secondary | ICD-10-CM | POA: Diagnosis not present

## 2020-11-27 DIAGNOSIS — I10 Essential (primary) hypertension: Secondary | ICD-10-CM | POA: Diagnosis not present

## 2020-11-27 DIAGNOSIS — C9002 Multiple myeloma in relapse: Secondary | ICD-10-CM | POA: Diagnosis not present

## 2020-11-27 DIAGNOSIS — Z8546 Personal history of malignant neoplasm of prostate: Secondary | ICD-10-CM | POA: Diagnosis not present

## 2020-11-27 DIAGNOSIS — C7951 Secondary malignant neoplasm of bone: Secondary | ICD-10-CM | POA: Diagnosis not present

## 2020-11-27 DIAGNOSIS — M1380 Other specified arthritis, unspecified site: Secondary | ICD-10-CM | POA: Diagnosis not present

## 2020-12-01 DIAGNOSIS — Z8546 Personal history of malignant neoplasm of prostate: Secondary | ICD-10-CM | POA: Diagnosis not present

## 2020-12-01 DIAGNOSIS — C9002 Multiple myeloma in relapse: Secondary | ICD-10-CM | POA: Diagnosis not present

## 2020-12-01 DIAGNOSIS — M1380 Other specified arthritis, unspecified site: Secondary | ICD-10-CM | POA: Diagnosis not present

## 2020-12-01 DIAGNOSIS — I10 Essential (primary) hypertension: Secondary | ICD-10-CM | POA: Diagnosis not present

## 2020-12-01 DIAGNOSIS — N400 Enlarged prostate without lower urinary tract symptoms: Secondary | ICD-10-CM | POA: Diagnosis not present

## 2020-12-01 DIAGNOSIS — C7951 Secondary malignant neoplasm of bone: Secondary | ICD-10-CM | POA: Diagnosis not present

## 2020-12-09 DIAGNOSIS — C7951 Secondary malignant neoplasm of bone: Secondary | ICD-10-CM | POA: Diagnosis not present

## 2020-12-09 DIAGNOSIS — Z8546 Personal history of malignant neoplasm of prostate: Secondary | ICD-10-CM | POA: Diagnosis not present

## 2020-12-09 DIAGNOSIS — C9002 Multiple myeloma in relapse: Secondary | ICD-10-CM | POA: Diagnosis not present

## 2020-12-09 DIAGNOSIS — N400 Enlarged prostate without lower urinary tract symptoms: Secondary | ICD-10-CM | POA: Diagnosis not present

## 2020-12-09 DIAGNOSIS — M1380 Other specified arthritis, unspecified site: Secondary | ICD-10-CM | POA: Diagnosis not present

## 2020-12-09 DIAGNOSIS — I10 Essential (primary) hypertension: Secondary | ICD-10-CM | POA: Diagnosis not present

## 2020-12-10 DIAGNOSIS — C7951 Secondary malignant neoplasm of bone: Secondary | ICD-10-CM | POA: Diagnosis not present

## 2020-12-10 DIAGNOSIS — I10 Essential (primary) hypertension: Secondary | ICD-10-CM | POA: Diagnosis not present

## 2020-12-10 DIAGNOSIS — N400 Enlarged prostate without lower urinary tract symptoms: Secondary | ICD-10-CM | POA: Diagnosis not present

## 2020-12-10 DIAGNOSIS — C61 Malignant neoplasm of prostate: Secondary | ICD-10-CM | POA: Diagnosis not present

## 2020-12-10 DIAGNOSIS — M1380 Other specified arthritis, unspecified site: Secondary | ICD-10-CM | POA: Diagnosis not present

## 2020-12-10 DIAGNOSIS — R310 Gross hematuria: Secondary | ICD-10-CM | POA: Diagnosis not present

## 2020-12-10 DIAGNOSIS — C9002 Multiple myeloma in relapse: Secondary | ICD-10-CM | POA: Diagnosis not present

## 2020-12-10 DIAGNOSIS — Z8546 Personal history of malignant neoplasm of prostate: Secondary | ICD-10-CM | POA: Diagnosis not present

## 2020-12-14 DIAGNOSIS — M1380 Other specified arthritis, unspecified site: Secondary | ICD-10-CM | POA: Diagnosis not present

## 2020-12-14 DIAGNOSIS — Z8546 Personal history of malignant neoplasm of prostate: Secondary | ICD-10-CM | POA: Diagnosis not present

## 2020-12-14 DIAGNOSIS — C7951 Secondary malignant neoplasm of bone: Secondary | ICD-10-CM | POA: Diagnosis not present

## 2020-12-14 DIAGNOSIS — I10 Essential (primary) hypertension: Secondary | ICD-10-CM | POA: Diagnosis not present

## 2020-12-14 DIAGNOSIS — C9002 Multiple myeloma in relapse: Secondary | ICD-10-CM | POA: Diagnosis not present

## 2020-12-14 DIAGNOSIS — N400 Enlarged prostate without lower urinary tract symptoms: Secondary | ICD-10-CM | POA: Diagnosis not present

## 2020-12-15 DIAGNOSIS — N400 Enlarged prostate without lower urinary tract symptoms: Secondary | ICD-10-CM | POA: Diagnosis not present

## 2020-12-15 DIAGNOSIS — M1380 Other specified arthritis, unspecified site: Secondary | ICD-10-CM | POA: Diagnosis not present

## 2020-12-15 DIAGNOSIS — I10 Essential (primary) hypertension: Secondary | ICD-10-CM | POA: Diagnosis not present

## 2020-12-15 DIAGNOSIS — C7951 Secondary malignant neoplasm of bone: Secondary | ICD-10-CM | POA: Diagnosis not present

## 2020-12-15 DIAGNOSIS — Z8546 Personal history of malignant neoplasm of prostate: Secondary | ICD-10-CM | POA: Diagnosis not present

## 2020-12-15 DIAGNOSIS — C9002 Multiple myeloma in relapse: Secondary | ICD-10-CM | POA: Diagnosis not present

## 2020-12-22 DIAGNOSIS — C9002 Multiple myeloma in relapse: Secondary | ICD-10-CM | POA: Diagnosis not present

## 2020-12-22 DIAGNOSIS — C7951 Secondary malignant neoplasm of bone: Secondary | ICD-10-CM | POA: Diagnosis not present

## 2020-12-22 DIAGNOSIS — N400 Enlarged prostate without lower urinary tract symptoms: Secondary | ICD-10-CM | POA: Diagnosis not present

## 2020-12-22 DIAGNOSIS — M1380 Other specified arthritis, unspecified site: Secondary | ICD-10-CM | POA: Diagnosis not present

## 2020-12-22 DIAGNOSIS — Z8546 Personal history of malignant neoplasm of prostate: Secondary | ICD-10-CM | POA: Diagnosis not present

## 2020-12-22 DIAGNOSIS — I10 Essential (primary) hypertension: Secondary | ICD-10-CM | POA: Diagnosis not present

## 2020-12-25 DIAGNOSIS — I10 Essential (primary) hypertension: Secondary | ICD-10-CM | POA: Diagnosis not present

## 2020-12-25 DIAGNOSIS — C9002 Multiple myeloma in relapse: Secondary | ICD-10-CM | POA: Diagnosis not present

## 2020-12-25 DIAGNOSIS — Z8546 Personal history of malignant neoplasm of prostate: Secondary | ICD-10-CM | POA: Diagnosis not present

## 2020-12-25 DIAGNOSIS — C7951 Secondary malignant neoplasm of bone: Secondary | ICD-10-CM | POA: Diagnosis not present

## 2020-12-25 DIAGNOSIS — N400 Enlarged prostate without lower urinary tract symptoms: Secondary | ICD-10-CM | POA: Diagnosis not present

## 2020-12-25 DIAGNOSIS — M1380 Other specified arthritis, unspecified site: Secondary | ICD-10-CM | POA: Diagnosis not present

## 2020-12-28 DIAGNOSIS — Z8546 Personal history of malignant neoplasm of prostate: Secondary | ICD-10-CM | POA: Diagnosis not present

## 2020-12-28 DIAGNOSIS — N400 Enlarged prostate without lower urinary tract symptoms: Secondary | ICD-10-CM | POA: Diagnosis not present

## 2020-12-28 DIAGNOSIS — M1380 Other specified arthritis, unspecified site: Secondary | ICD-10-CM | POA: Diagnosis not present

## 2020-12-28 DIAGNOSIS — C9002 Multiple myeloma in relapse: Secondary | ICD-10-CM | POA: Diagnosis not present

## 2020-12-28 DIAGNOSIS — Z8551 Personal history of malignant neoplasm of bladder: Secondary | ICD-10-CM | POA: Diagnosis not present

## 2020-12-28 DIAGNOSIS — I10 Essential (primary) hypertension: Secondary | ICD-10-CM | POA: Diagnosis not present

## 2020-12-28 DIAGNOSIS — C7951 Secondary malignant neoplasm of bone: Secondary | ICD-10-CM | POA: Diagnosis not present

## 2020-12-29 DIAGNOSIS — C9002 Multiple myeloma in relapse: Secondary | ICD-10-CM | POA: Diagnosis not present

## 2020-12-29 DIAGNOSIS — I10 Essential (primary) hypertension: Secondary | ICD-10-CM | POA: Diagnosis not present

## 2020-12-29 DIAGNOSIS — M1380 Other specified arthritis, unspecified site: Secondary | ICD-10-CM | POA: Diagnosis not present

## 2020-12-29 DIAGNOSIS — Z8546 Personal history of malignant neoplasm of prostate: Secondary | ICD-10-CM | POA: Diagnosis not present

## 2020-12-29 DIAGNOSIS — N400 Enlarged prostate without lower urinary tract symptoms: Secondary | ICD-10-CM | POA: Diagnosis not present

## 2020-12-29 DIAGNOSIS — C7951 Secondary malignant neoplasm of bone: Secondary | ICD-10-CM | POA: Diagnosis not present

## 2020-12-30 DIAGNOSIS — C9002 Multiple myeloma in relapse: Secondary | ICD-10-CM | POA: Diagnosis not present

## 2020-12-30 DIAGNOSIS — Z8546 Personal history of malignant neoplasm of prostate: Secondary | ICD-10-CM | POA: Diagnosis not present

## 2020-12-30 DIAGNOSIS — C7951 Secondary malignant neoplasm of bone: Secondary | ICD-10-CM | POA: Diagnosis not present

## 2020-12-30 DIAGNOSIS — I10 Essential (primary) hypertension: Secondary | ICD-10-CM | POA: Diagnosis not present

## 2020-12-30 DIAGNOSIS — N400 Enlarged prostate without lower urinary tract symptoms: Secondary | ICD-10-CM | POA: Diagnosis not present

## 2020-12-30 DIAGNOSIS — M1380 Other specified arthritis, unspecified site: Secondary | ICD-10-CM | POA: Diagnosis not present

## 2020-12-31 DIAGNOSIS — R310 Gross hematuria: Secondary | ICD-10-CM | POA: Diagnosis not present

## 2021-01-05 DIAGNOSIS — I10 Essential (primary) hypertension: Secondary | ICD-10-CM | POA: Diagnosis not present

## 2021-01-05 DIAGNOSIS — Z8546 Personal history of malignant neoplasm of prostate: Secondary | ICD-10-CM | POA: Diagnosis not present

## 2021-01-05 DIAGNOSIS — N400 Enlarged prostate without lower urinary tract symptoms: Secondary | ICD-10-CM | POA: Diagnosis not present

## 2021-01-05 DIAGNOSIS — C9002 Multiple myeloma in relapse: Secondary | ICD-10-CM | POA: Diagnosis not present

## 2021-01-05 DIAGNOSIS — M1380 Other specified arthritis, unspecified site: Secondary | ICD-10-CM | POA: Diagnosis not present

## 2021-01-05 DIAGNOSIS — C7951 Secondary malignant neoplasm of bone: Secondary | ICD-10-CM | POA: Diagnosis not present

## 2021-01-06 DIAGNOSIS — Z8546 Personal history of malignant neoplasm of prostate: Secondary | ICD-10-CM | POA: Diagnosis not present

## 2021-01-06 DIAGNOSIS — C7951 Secondary malignant neoplasm of bone: Secondary | ICD-10-CM | POA: Diagnosis not present

## 2021-01-06 DIAGNOSIS — C9002 Multiple myeloma in relapse: Secondary | ICD-10-CM | POA: Diagnosis not present

## 2021-01-06 DIAGNOSIS — I10 Essential (primary) hypertension: Secondary | ICD-10-CM | POA: Diagnosis not present

## 2021-01-06 DIAGNOSIS — N400 Enlarged prostate without lower urinary tract symptoms: Secondary | ICD-10-CM | POA: Diagnosis not present

## 2021-01-06 DIAGNOSIS — M1380 Other specified arthritis, unspecified site: Secondary | ICD-10-CM | POA: Diagnosis not present

## 2021-01-11 DIAGNOSIS — Z8614 Personal history of Methicillin resistant Staphylococcus aureus infection: Secondary | ICD-10-CM | POA: Diagnosis not present

## 2021-01-11 DIAGNOSIS — D485 Neoplasm of uncertain behavior of skin: Secondary | ICD-10-CM | POA: Diagnosis not present

## 2021-01-11 DIAGNOSIS — C44329 Squamous cell carcinoma of skin of other parts of face: Secondary | ICD-10-CM | POA: Diagnosis not present

## 2021-01-11 DIAGNOSIS — D0439 Carcinoma in situ of skin of other parts of face: Secondary | ICD-10-CM | POA: Diagnosis not present

## 2021-01-11 DIAGNOSIS — L570 Actinic keratosis: Secondary | ICD-10-CM | POA: Diagnosis not present

## 2021-01-12 DIAGNOSIS — M1380 Other specified arthritis, unspecified site: Secondary | ICD-10-CM | POA: Diagnosis not present

## 2021-01-12 DIAGNOSIS — N400 Enlarged prostate without lower urinary tract symptoms: Secondary | ICD-10-CM | POA: Diagnosis not present

## 2021-01-12 DIAGNOSIS — Z8546 Personal history of malignant neoplasm of prostate: Secondary | ICD-10-CM | POA: Diagnosis not present

## 2021-01-12 DIAGNOSIS — I10 Essential (primary) hypertension: Secondary | ICD-10-CM | POA: Diagnosis not present

## 2021-01-12 DIAGNOSIS — C7951 Secondary malignant neoplasm of bone: Secondary | ICD-10-CM | POA: Diagnosis not present

## 2021-01-12 DIAGNOSIS — C9002 Multiple myeloma in relapse: Secondary | ICD-10-CM | POA: Diagnosis not present

## 2021-01-13 DIAGNOSIS — C9002 Multiple myeloma in relapse: Secondary | ICD-10-CM | POA: Diagnosis not present

## 2021-01-13 DIAGNOSIS — C7951 Secondary malignant neoplasm of bone: Secondary | ICD-10-CM | POA: Diagnosis not present

## 2021-01-13 DIAGNOSIS — N400 Enlarged prostate without lower urinary tract symptoms: Secondary | ICD-10-CM | POA: Diagnosis not present

## 2021-01-13 DIAGNOSIS — M1380 Other specified arthritis, unspecified site: Secondary | ICD-10-CM | POA: Diagnosis not present

## 2021-01-13 DIAGNOSIS — Z8546 Personal history of malignant neoplasm of prostate: Secondary | ICD-10-CM | POA: Diagnosis not present

## 2021-01-13 DIAGNOSIS — I10 Essential (primary) hypertension: Secondary | ICD-10-CM | POA: Diagnosis not present

## 2021-01-19 DIAGNOSIS — M1380 Other specified arthritis, unspecified site: Secondary | ICD-10-CM | POA: Diagnosis not present

## 2021-01-19 DIAGNOSIS — Z8546 Personal history of malignant neoplasm of prostate: Secondary | ICD-10-CM | POA: Diagnosis not present

## 2021-01-19 DIAGNOSIS — N400 Enlarged prostate without lower urinary tract symptoms: Secondary | ICD-10-CM | POA: Diagnosis not present

## 2021-01-19 DIAGNOSIS — I10 Essential (primary) hypertension: Secondary | ICD-10-CM | POA: Diagnosis not present

## 2021-01-19 DIAGNOSIS — C9002 Multiple myeloma in relapse: Secondary | ICD-10-CM | POA: Diagnosis not present

## 2021-01-19 DIAGNOSIS — C7951 Secondary malignant neoplasm of bone: Secondary | ICD-10-CM | POA: Diagnosis not present

## 2021-01-26 DIAGNOSIS — C61 Malignant neoplasm of prostate: Secondary | ICD-10-CM | POA: Diagnosis not present

## 2021-01-26 DIAGNOSIS — N393 Stress incontinence (female) (male): Secondary | ICD-10-CM | POA: Diagnosis not present

## 2021-01-26 DIAGNOSIS — N390 Urinary tract infection, site not specified: Secondary | ICD-10-CM | POA: Diagnosis not present

## 2021-01-26 DIAGNOSIS — R972 Elevated prostate specific antigen [PSA]: Secondary | ICD-10-CM | POA: Diagnosis not present

## 2021-01-26 DIAGNOSIS — N529 Male erectile dysfunction, unspecified: Secondary | ICD-10-CM | POA: Diagnosis not present

## 2021-01-27 DIAGNOSIS — Z8546 Personal history of malignant neoplasm of prostate: Secondary | ICD-10-CM | POA: Diagnosis not present

## 2021-01-27 DIAGNOSIS — N400 Enlarged prostate without lower urinary tract symptoms: Secondary | ICD-10-CM | POA: Diagnosis not present

## 2021-01-27 DIAGNOSIS — C7951 Secondary malignant neoplasm of bone: Secondary | ICD-10-CM | POA: Diagnosis not present

## 2021-01-27 DIAGNOSIS — I10 Essential (primary) hypertension: Secondary | ICD-10-CM | POA: Diagnosis not present

## 2021-01-27 DIAGNOSIS — C9002 Multiple myeloma in relapse: Secondary | ICD-10-CM | POA: Diagnosis not present

## 2021-01-27 DIAGNOSIS — M1380 Other specified arthritis, unspecified site: Secondary | ICD-10-CM | POA: Diagnosis not present

## 2021-02-04 DIAGNOSIS — Z800 Family history of malignant neoplasm of digestive organs: Secondary | ICD-10-CM | POA: Diagnosis not present

## 2021-02-04 DIAGNOSIS — K439 Ventral hernia without obstruction or gangrene: Secondary | ICD-10-CM | POA: Diagnosis not present

## 2021-02-04 DIAGNOSIS — Z1211 Encounter for screening for malignant neoplasm of colon: Secondary | ICD-10-CM | POA: Diagnosis not present

## 2021-02-04 DIAGNOSIS — Z86010 Personal history of colonic polyps: Secondary | ICD-10-CM | POA: Diagnosis not present

## 2021-08-23 ENCOUNTER — Telehealth: Payer: Self-pay

## 2021-08-23 NOTE — Telephone Encounter (Signed)
Called pt to schedule AWV. Please schedule with health coach, Toria or Mina Babula. ? ?

## 2022-01-08 ENCOUNTER — Telehealth: Payer: Self-pay

## 2022-01-08 NOTE — Telephone Encounter (Signed)
Called pt to schedule AWV. Please schedule with health coach, Derek Castro, get[place].

## 2022-02-15 ENCOUNTER — Telehealth: Payer: Self-pay | Admitting: Family Medicine

## 2022-02-15 NOTE — Telephone Encounter (Signed)
Left message for patient to schedule Annual Wellness Visit.  Please schedule (telephone/video call) with Nurse Health Advisor Tina Betterson, RN at [facility]. Please call [phone] ask for Kathy
